# Patient Record
Sex: Male | Born: 1935 | Race: White | Hispanic: No | Smoking: Never smoker
Health system: Southern US, Community
[De-identification: ages and names within clinical notes are randomized; demographics above are authoritative.]

## PROBLEM LIST (undated history)

## (undated) DIAGNOSIS — R350 Frequency of micturition: Secondary | ICD-10-CM

## (undated) DIAGNOSIS — E785 Hyperlipidemia, unspecified: Secondary | ICD-10-CM

## (undated) DIAGNOSIS — C61 Malignant neoplasm of prostate: Secondary | ICD-10-CM

## (undated) DIAGNOSIS — I2510 Atherosclerotic heart disease of native coronary artery without angina pectoris: Secondary | ICD-10-CM

## (undated) HISTORY — DX: Malignant neoplasm of prostate: C61

## (undated) HISTORY — PX: CORONARY ANGIOPLASTY WITH STENT PLACEMENT: SHX49

## (undated) HISTORY — DX: Atherosclerotic heart disease of native coronary artery without angina pectoris: I25.10

## (undated) HISTORY — DX: Hyperlipidemia, unspecified: E78.5

---

## 1998-09-20 ENCOUNTER — Observation Stay (HOSPITAL_COMMUNITY): Admission: AD | Admit: 1998-09-20 | Discharge: 1998-09-21 | Payer: Self-pay | Admitting: Cardiovascular Disease

## 1999-05-02 ENCOUNTER — Observation Stay (HOSPITAL_COMMUNITY): Admission: AD | Admit: 1999-05-02 | Discharge: 1999-05-03 | Payer: Self-pay | Admitting: Cardiovascular Disease

## 1999-05-02 HISTORY — PX: CARDIAC CATHETERIZATION: SHX172

## 2003-08-31 ENCOUNTER — Encounter: Payer: Self-pay | Admitting: Emergency Medicine

## 2003-08-31 ENCOUNTER — Emergency Department (HOSPITAL_COMMUNITY): Admission: EM | Admit: 2003-08-31 | Discharge: 2003-08-31 | Payer: Self-pay | Admitting: Emergency Medicine

## 2005-07-07 HISTORY — PX: US ECHOCARDIOGRAPHY: HXRAD669

## 2006-04-21 ENCOUNTER — Ambulatory Visit: Payer: Self-pay | Admitting: Internal Medicine

## 2006-05-21 ENCOUNTER — Ambulatory Visit: Payer: Self-pay | Admitting: Internal Medicine

## 2006-06-26 ENCOUNTER — Encounter: Payer: Self-pay | Admitting: *Deleted

## 2006-06-26 ENCOUNTER — Inpatient Hospital Stay (HOSPITAL_COMMUNITY): Admission: AD | Admit: 2006-06-26 | Discharge: 2006-06-29 | Payer: Self-pay

## 2006-08-24 ENCOUNTER — Ambulatory Visit (HOSPITAL_COMMUNITY): Admission: RE | Admit: 2006-08-24 | Discharge: 2006-08-24 | Payer: Self-pay | Admitting: Internal Medicine

## 2007-01-14 HISTORY — PX: CARDIOVASCULAR STRESS TEST: SHX262

## 2007-01-24 ENCOUNTER — Inpatient Hospital Stay (HOSPITAL_BASED_OUTPATIENT_CLINIC_OR_DEPARTMENT_OTHER): Admission: RE | Admit: 2007-01-24 | Discharge: 2007-01-24 | Payer: Self-pay | Admitting: Cardiovascular Disease

## 2007-01-24 HISTORY — PX: CARDIAC CATHETERIZATION: SHX172

## 2008-08-02 ENCOUNTER — Inpatient Hospital Stay (HOSPITAL_COMMUNITY): Admission: EM | Admit: 2008-08-02 | Discharge: 2008-08-08 | Payer: Self-pay | Admitting: Emergency Medicine

## 2008-10-26 ENCOUNTER — Ambulatory Visit: Payer: Self-pay | Admitting: Vascular Surgery

## 2008-11-06 ENCOUNTER — Ambulatory Visit (HOSPITAL_COMMUNITY): Admission: RE | Admit: 2008-11-06 | Discharge: 2008-11-06 | Payer: Self-pay | Admitting: Internal Medicine

## 2009-02-17 ENCOUNTER — Emergency Department (HOSPITAL_BASED_OUTPATIENT_CLINIC_OR_DEPARTMENT_OTHER): Admission: EM | Admit: 2009-02-17 | Discharge: 2009-02-17 | Payer: Self-pay | Admitting: Emergency Medicine

## 2009-02-17 ENCOUNTER — Ambulatory Visit: Payer: Self-pay | Admitting: Diagnostic Radiology

## 2009-05-15 ENCOUNTER — Ambulatory Visit: Admission: RE | Admit: 2009-05-15 | Discharge: 2009-07-15 | Payer: Self-pay | Admitting: Radiation Oncology

## 2009-05-28 ENCOUNTER — Encounter (HOSPITAL_COMMUNITY): Admission: RE | Admit: 2009-05-28 | Discharge: 2009-08-22 | Payer: Self-pay | Admitting: Urology

## 2009-07-17 ENCOUNTER — Ambulatory Visit: Admission: RE | Admit: 2009-07-17 | Discharge: 2009-10-13 | Payer: Self-pay | Admitting: Radiation Oncology

## 2009-08-21 LAB — URINALYSIS, MICROSCOPIC - CHCC
Glucose: NEGATIVE g/dL
Leukocyte Esterase: NEGATIVE
Nitrite: NEGATIVE
Protein: <30 mg/dL
Specific Gravity, Urine: 1.03 (ref 1.003–1.035)

## 2009-12-01 ENCOUNTER — Emergency Department (HOSPITAL_BASED_OUTPATIENT_CLINIC_OR_DEPARTMENT_OTHER): Admission: EM | Admit: 2009-12-01 | Discharge: 2009-12-01 | Payer: Self-pay | Admitting: Emergency Medicine

## 2009-12-01 ENCOUNTER — Ambulatory Visit: Payer: Self-pay | Admitting: Diagnostic Radiology

## 2009-12-30 ENCOUNTER — Ambulatory Visit (HOSPITAL_COMMUNITY): Admission: RE | Admit: 2009-12-30 | Discharge: 2009-12-30 | Payer: Self-pay | Admitting: Internal Medicine

## 2010-03-08 ENCOUNTER — Emergency Department (HOSPITAL_BASED_OUTPATIENT_CLINIC_OR_DEPARTMENT_OTHER): Admission: EM | Admit: 2010-03-08 | Discharge: 2010-03-08 | Payer: Self-pay | Admitting: Emergency Medicine

## 2010-03-14 ENCOUNTER — Emergency Department (HOSPITAL_BASED_OUTPATIENT_CLINIC_OR_DEPARTMENT_OTHER): Admission: EM | Admit: 2010-03-14 | Discharge: 2010-03-14 | Payer: Self-pay | Admitting: Emergency Medicine

## 2010-07-03 ENCOUNTER — Ambulatory Visit: Payer: Self-pay | Admitting: Cardiovascular Disease

## 2010-07-30 ENCOUNTER — Encounter: Payer: Self-pay | Admitting: Internal Medicine

## 2010-07-31 ENCOUNTER — Encounter: Payer: Self-pay | Admitting: Internal Medicine

## 2010-08-01 ENCOUNTER — Telehealth: Payer: Self-pay | Admitting: Internal Medicine

## 2010-08-03 ENCOUNTER — Encounter: Payer: Self-pay | Admitting: Internal Medicine

## 2010-08-06 ENCOUNTER — Encounter (INDEPENDENT_AMBULATORY_CARE_PROVIDER_SITE_OTHER): Payer: Self-pay | Admitting: *Deleted

## 2010-08-06 ENCOUNTER — Ambulatory Visit: Payer: Self-pay | Admitting: Internal Medicine

## 2010-08-06 DIAGNOSIS — I259 Chronic ischemic heart disease, unspecified: Secondary | ICD-10-CM

## 2010-08-06 DIAGNOSIS — K520 Gastroenteritis and colitis due to radiation: Secondary | ICD-10-CM

## 2010-08-06 LAB — CONVERTED CEMR LAB: WBC: 7 10*3/uL

## 2010-08-14 ENCOUNTER — Encounter (INDEPENDENT_AMBULATORY_CARE_PROVIDER_SITE_OTHER): Payer: Self-pay | Admitting: *Deleted

## 2010-09-10 ENCOUNTER — Ambulatory Visit: Payer: Self-pay | Admitting: Internal Medicine

## 2010-09-10 ENCOUNTER — Ambulatory Visit (HOSPITAL_COMMUNITY): Admission: RE | Admit: 2010-09-10 | Discharge: 2010-09-10 | Payer: Self-pay | Admitting: Internal Medicine

## 2010-10-27 ENCOUNTER — Ambulatory Visit: Payer: Self-pay | Admitting: Internal Medicine

## 2010-12-23 NOTE — Letter (Signed)
Summary: Diabetic Instructions  [facility]  [address] [place], [place] [address]   Phone: [phone]  Fax: [phone]    [name] [birth year] MRN: [id number]   _X_   ORAL DIABETIC MEDICATION INSTRUCTIONS  The day before your procedure:   Take your diabetic pill as you do normally  The day of your procedure:   Do not take your diabetic pill    We will check your blood sugar levels during the admission process and again in Recovery before discharging you home  ________________________________________________________________________  _X_   INSULIN (LONG ACTING) MEDICATION INSTRUCTIONS (Lantus, NPH, 70/30, Humulin, Novolin-N)   The day before your procedure:   Take  your regular evening dose    The day of your procedure:   Do not take your morning dose    _X_   INSULIN (SHORT ACTING) MEDICATION INSTRUCTIONS (Regular, Humulog, Novolog)   The day before your procedure:   Do not take your evening dose   The day of your procedure:   Do not take your morning dose

## 2010-12-23 NOTE — Procedures (Signed)
Summary: Colonoscopy: Diverticulosis   Colonoscopy  Procedure date:  05/21/2006  Findings:      Results: Diverticulosis. Location:  [facility].  Comments: 1) NO POLYPS OR CANCER SEEN 2) SIGMOID DIVERTICULOSIS 3) EXCELLENT PREP 4) 12 MIN 22 SEC COLON WITHDRAWAL TIME  Patient Name: [name], [name] MRN:  Procedure Procedures: Colorectal cancer screening, average risk CPT: G0121.  Personnel: Endoscopist: Iva Boop, MD, [facility].  Referred By: Rodrigo Ran, MD.  Exam Location: Exam performed in Outpatient Clinic. Outpatient  Patient Consent: Procedure, Alternatives, Risks and Benefits discussed, consent obtained, from patient. Consent was obtained by the RN.  Indications  Average Risk Screening Routine.  History  Current Medications: Patient is not currently taking Coumadin.  Allergies: Allergic to SULFA.  Pre-Exam Physical: Performed May 21, 2006. Cardio-pulmonary exam WNL. Rectal exam abnormal. HEENT exam , Abdominal exam, Mental status exam WNL. Abnormal PE findings include: Prostate 3+ without nodules.  Comments: Pt. history reviewed/updated, physical exam performed prior to initiation of sedation? YES Exam Exam: Extent of exam reached: Cecum, extent intended: Cecum.  The cecum was identified by appendiceal orifice and IC valve. Patient position: on left side. Time for Withdrawl: 00:12:22. Colon retroflexion performed. Images taken. ASA Classification: II. Tolerance: excellent.  Monitoring: Pulse and BP monitoring, Oximetry used. Supplemental O2 given.  Colon Prep Used MiraLax for colon prep. Prep results: excellent.  Sedation Meds: Patient assessed and found to be appropriate for moderate (conscious) sedation. Fentanyl 50 mcg. given IV. Versed 5 mg. given IV.  Findings - DIVERTICULOSIS: Sigmoid Colon. ICD9: Diverticulosis, Colon: 562.10.  - NORMAL EXAM: Cecum to Descending Colon.  NORMAL EXAM: Rectum.   Assessment  Diagnoses: 562.10:  Diverticulosis, Colon.   Comments: 1) NO POLYPS OR CANCER SEEN 2) SIGMOID DIVERTICULOSIS 3) EXCELLENT PREP 4) 12 MIN 22 SEC COLON WITHDRAWAL TIME Events  Unplanned Interventions: No intervention was required.  Plans Patient Education: Patient given standard instructions for: Diverticulosis.  Disposition: After procedure patient sent to recovery. After recovery patient sent home.  Scheduling/Referral: Primary Care Provider, to Rodrigo Ran, MD, AS PLANNED,   Comments: He will "age out" of routine colonscopy screening at 45. Will defer to Dr. Waynard Edwards re: future screening. Investigate colonic signs and symptoms as appropriate in future.  CC:   Rodrigo Ran, MD  This report was created from the original endoscopy report, which was reviewed and signed by the above listed endoscopist.

## 2010-12-23 NOTE — Assessment & Plan Note (Signed)
Summary: RECTAL BLEEDING/DIARRHEA             [name]   History of Present Illness Visit Type: Initial Consult Primary GI MD: Stan Head MD [facility] Primary Provider: Rodrigo Ran, MD Requesting Provider: Rodrigo Ran, MD Chief Complaint: BRB stool 7 days ago, gassy diarrhea History of Present Illness:   77 yo wm with 2 month hx of very small amount of rectal bleeding. He had taken some suppositories. Then recently had a gassy stool with more red blood. Since then, about 1 week ago he has been ok, no blood on toilet paper.  Here with daughter and she provides some history.    GI Review of Systems    Reports bloating.      Denies abdominal pain, acid reflux, belching, chest pain, dysphagia with liquids, dysphagia with solids, heartburn, loss of appetite, nausea, vomiting, vomiting blood, weight loss, and  weight gain.      Reports diarrhea and  rectal bleeding.     Denies anal fissure, black tarry stools, change in bowel habit, constipation, diverticulosis, fecal incontinence, heme positive stool, hemorrhoids, irritable bowel syndrome, jaundice, light color stool, liver problems, and  rectal pain.    Current Medications (verified): 1)  Colace 50 Mg Caps (Docusate Sodium) .... Take 1 Capsule By Mouth Once A Day As Needed 2)  Crestor 20 Mg Tabs (Rosuvastatin Calcium) .... Take 1 Tablet By Mouth Once A Day 3)  Glucophage 1000 Mg Tabs (Metformin Hcl) .... Take 1 Tablet By Mouth Two Times A Day 4)  Imdur 30 Mg Xr24h-Tab (Isosorbide Mononitrate) .... Take 1 Tablet By Mouth Once A Day 5)  Lantus 100 Unit/ml Soln (Insulin Glargine) .... Inject 19 Untis in The Morning and 19 Untis in The Evening 6)  Lexapro 10 Mg Tabs (Escitalopram Oxalate) .... Take 1/2 Tab By Mouth Once Daily 7)  Novolog Flexpen 100 Unit/ml Soln (Insulin Aspart) .... Inject 8 Units With Each Meal 8)  Plavix 75 Mg Tabs (Clopidogrel Bisulfate) .... Take 1 Tablet By Mouth Once A Day 9)  Reclast 5 Mg/12ml Soln (Zoledronic Acid)  .... One Injection Yearly 10)  Aspirin 81 Mg Tabs (Aspirin) .... Take 1 Tablet By Mouth Once A Day 11)  Vitamin B-12 100 Mcg Tabs (Cyanocobalamin) .... Take 1 Tablet By Mouth Once A Day 12)  Cinnamon 500 Mg Tabs (Cinnamon) .... Take 1 Tablet By Mouth Once A Day 13)  Citracal/vitamin D 250-200 Mg-Unit Tabs (Calcium Citrate-Vitamin D) .... Take 1 Tablet By Mouth Two Times A Day 14)  Fish Oil 1200 Mg Caps (Omega-3 Fatty Acids) .... Take 1 Capsule By Mouth Once A Day 15)  Vitamin D 1000 Unit Tabs (Cholecalciferol) .... .qddtab 16)  Nystop 100000 Unit/gm Powd (Nystatin) .... Apply To Affected Area Two Times A Day As Needed 17)  Anusol-Hc 25 Mg Supp (Hydrocortisone Acetate) .... Unwrap and Insert 1 Suppository  Two Times A Day As Needed For Anal Irritation 18)  Glucosamine-Chondroitin  Caps (Glucosamine-Chondroit-Vit C-Mn) .... Take 2 Capsule Daily 19)  Centrum Silver  Tabs (Multiple Vitamins-Minerals) .... Once Daily 20)  Actos 30 Mg Tabs (Pioglitazone Hcl) .... Once Daily 21)  Bicalutamide 50 Mg Tabs (Bicalutamide) .... Once Daily  Allergies (verified): 1)  ! Sulfa  Past History:  Past Medical History: Coronary Artery Disease Diabetes Diverticulosis Myocardial Infarction Anal Fissure Prostate Cancer XRT late 2010  Past Surgical History: Reviewed history from 08/06/2010 and no changes required. PTCA-Stent  Family History: Reviewed history from 08/06/2010 and no changes required. Family History  of Diabetes: Father Family History of Heart Disease: Father  Social History: Reviewed history from 08/06/2010 and no changes required. 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  Alcohol Use - yes Daily Caffeine Use Illicit Drug Use - no Patient gets regular exercise.  Review of Systems       The patient complains of muscle pains/cramps.         All other ROS negative except as per HPI.   Vital Signs:  Patient profile:   77 year old male Height:      74  inches Weight:      206.50 pounds BMI:     26.61  Vitals Entered By: June McMurray CMA Duncan Dull) (August 06, 2010 3:17 PM)  Physical Exam  General:  elderly NAD Eyes:  anicteric Neck:  supple Lungs:  Clear throughout to auscultation. Heart:  Regular rate and rhythm; no murmurs, rubs,  or bruits. Rectal:  soft brown stool no mass mild perianal scaly erythema ANOSCOPY: hemorrhoids and suspected telangiectasia of anorectum  Extremities:  no edema Psych:  Alert and cooperative. Normal mood and affect.  Office notes and labs from Dr. Waynard Edwards reviewed CBC 07/31/10 was normal  Impression & Recommendations:  Problem # 1:  RECTAL BLEEDING (ICD-569.3) I suspect radiation proctitis Have recommended  flex sig with likely APC Risks, benefits,and indications of endoscopic procedure(s) were reviewed with the patient and all questions answered. He will need to hold Plavix but continue ASA, which will increase usual risks of the procedure to risk of stent closure  Orders: ZFLEX  APC (ZFL APC)  Problem # 2:  LOOSE STOOLS (ICD-787.91) Assessment: New Probably from XRT also but could be a more transient (non-infectious) issue. Await flex sig.  Problem # 3:  CORONARY ARTERY DISEASE, S/P PTCA (ICD-414.9) Assessment: New On Plavix, needs to ne held to allow APC I think. Will ask for opinion from Dr. Elease Hashimoto. Removing Plavix does raise risk of vascular event but benefits outweigh risk in my opinion.  Patient Instructions: 1)  Please pick up your medications at your pharmacy.  2)  We will see you at your procedure on 09/10/10. 3)  Colonoscopy and Flexible Sigmoidoscopy brochure given.  4)  We will contact Dr. Elease Hashimoto regarding your plavix.  You will be contacted by our office prior to your procedure for directions on holding your Plavix.  If you do not hear from our office 1 week prior to your scheduled procedure, please call [phone] to discuss.  5)  Copy sent to : Rodrigo Ran, MD; Kristeen Miss, MD 6)  The medication list was reviewed and reconciled.  All changed / newly prescribed medications were explained.  A complete medication list was provided to the patient / caregiver. Prescriptions: REGLAN 10 MG  TABS (METOCLOPRAMIDE HCL) As per prep instructions.  #2 x 0   Entered by:   Francee Piccolo CMA (AAMA)   Authorized by:   Iva Boop MD, [facility]   Signed by:   Francee Piccolo CMA (AAMA) on 08/06/2010   Method used:   Electronically to        [facility] [phone]* (retail)       [address]       [place], [place]  [address]       Ph: [phone]       Fax: [phone]   RxID:   [id number] DULCOLAX 5 MG  TBEC (BISACODYL) Day  before procedure take 2 at 3pm and 2 at 8pm.  #4 x 0   Entered by:   Francee Piccolo CMA (AAMA)   Authorized by:   Iva Boop MD, [name]   Signed by:   Francee Piccolo CMA (AAMA) on 08/06/2010   Method used:   Electronically to        [facility] [phone]* (retail)       [address]       [place], [place]  [address]       Ph: [phone]       Fax: [phone]   RxID:   [id number] MIRALAX   POWD (POLYETHYLENE GLYCOL 3350) As per prep  instructions.  #255gm x 0   Entered by:   Francee Piccolo CMA (AAMA)   Authorized by:   Iva Boop MD, [facility]   Signed by:   Francee Piccolo CMA (AAMA) on 08/06/2010   Method used:   Electronically to        [facility] [phone]* (retail)       [address]       [place], [place]  [address]       Ph: [phone]       Fax: [phone]   RxID:   [id number]

## 2010-12-23 NOTE — Letter (Signed)
Summary: [facility]  [facility]   Imported By: Sherian Rein 08/12/2010 09:13:20  _____________________________________________________________________  External Attachment:    Type:   Image     Comment:   External Document

## 2010-12-23 NOTE — Letter (Signed)
Summary: [facility]  [facility]   Imported By: Sherian Rein 08/12/2010 09:15:50  _____________________________________________________________________  External Attachment:    Type:   Image     Comment:   External Document

## 2010-12-23 NOTE — Procedures (Signed)
Summary: Flexible Sigmoidoscopy  Patient: [name] Note: All result statuses are Final unless otherwise noted.  Tests: (1) Flexible Sigmoidoscopy (FLX)  FLX Flexible Sigmoidoscopy                             DONE     [facility]     [address] [place], [place]  [address]           FLEXIBLE SIGMOIDOSCOPY PROCEDURE REPORT           PATIENT:  [name], [name]  MR#:  [id number]     BIRTHDATE:  1927-04-02, 83 yrs. old  GENDER:  male           ENDOSCOPIST:  Iva Boop, MD, [facility]           PROCEDURE DATE:  09/10/2010     PROCEDURE:  Flexible sigmoidoscopy with APC ablation of lesion     ASA CLASS:  Class III     INDICATIONS:  rectal bleeding some loose stools also     had XRT for prostate cancer in 2010           MEDICATIONS:   Fentanyl 25 mcg IV, Versed 3 mg           DESCRIPTION OF PROCEDURE:   After the risks benefits and     alternatives of the procedure were thoroughly explained, informed     consent was obtained.  Digital rectal exam was performed and     revealed no abnormalities.   The  endoscope was introduced through     the anus and advanced to the sigmoid colon, without limitations.     The quality of the prep was excellent.  The instrument was then     slowly withdrawn as the mucosa was fully examined.     <<PROCEDUREIMAGES>>           Radiation proctitis was seen in  in the rectum, extensive     telangiectasia from anal verge and a few cm in, only on one side     (anterior).  This was ablated using the argon plasma coagulator     with setting of 60 and 1L flow. Good results though slight amount     of residual abnormality not treated due to edema and effects of     APC (expected). Normal sigmoid.   Retroflexed views in the rectum     revealed proctitis.    The scope was then withdrawn from the     patient and the procedure terminated.           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     3) Call Dr. Marvell Fuller office by next week to arrange a follow-up     visit for about 6 weeks from now (late Nov or early Dec). repeat     ablation may be needed depending upon effects of today's     treatment/           REPEAT EXAM:  In for as needed.  Iva Boop, MD, Clementeen Graham           CC:  Rodrigo Ran, MD, Kristeen Miss, MD, Chipper Herb MD, Barron Alvine, MD, and The Patient           n.     eSIGNED:   Iva Boop at 09/10/2010 10:08 AM           Posey Rea, [phone]  Note: An exclamation mark (!) indicates a result that was not dispersed into the flowsheet. Document Creation Date: 09/10/2010 10:08 AM _______________________________________________________________________  (1) Order result status: Final Collection or observation date-time: 09/10/2010 09:57 Requested date-time:  Receipt date-time:  Reported date-time:  Referring Physician:   Ordering Physician: Stan Head [phone]) Specimen Source:  Source: Launa Grill Order Number: [id number] Lab site:

## 2010-12-23 NOTE — Letter (Signed)
Summary: [facility] Instructions  [facility]  [address] [place], [place] [address]   Phone: [phone]  Fax: [phone]       [name]    30-Oct-1927    MRN: [id number]       Procedure Day Dorna BloomLulu Riding, 09/10/10     Arrival Time: 8:00 AM     Procedure Time: 9:00 AM    Location of Procedure:                    _X_  [facility] ( Outpatient Registration)      PREPARATION FOR FLEXIBLE SIGMOIDOSCOPY WITH MIRALAX  Starting 5 days prior to your procedure 09/05/10 do not eat nuts, seeds, popcorn, corn, beans, peas,  salads, or any raw vegetables.  Do not take any fiber supplements (e.g. Metamucil, Citrucel, and Benefiber). ____________________________________________________________________________________________________   THE DAY BEFORE YOUR PROCEDURE         TUESDAY, 09/09/10  1   Drink clear liquids the entire day-NO SOLID FOOD  2   Do not drink anything colored red or purple.  Avoid juices with pulp.  No orange juice.  3   Drink at least 64 oz. (8 glasses) of fluid/clear liquids during the day to prevent dehydration and help the prep work efficiently.  CLEAR LIQUIDS INCLUDE: Water Jello Ice Popsicles Tea (sugar ok, no milk/cream) Powdered fruit flavored drinks Coffee (sugar ok, no milk/cream) Gatorade Juice: apple, white grape, white cranberry  Lemonade Clear bullion, consomm, broth Carbonated beverages (any kind) Strained chicken noodle soup Hard Candy  4   Mix the entire bottle of Miralax with 64 oz. of Gatorade/Powerade in the morning and put in the refrigerator to chill.  5   At 3:00 pm take 2 Dulcolax/Bisacodyl tablets.  6   At 4:30 pm take one Reglan/Metoclopramide tablet.  7  Starting at 5:00 pm drink one 8 oz glass of the Miralax mixture every 15-20 minutes until you have finished drinking the entire 64 oz.  You should finish drinking prep around 7:30 or 8:00 pm.  8   If you are nauseated, you may take the 2nd  Reglan/Metoclopramide tablet at 6:30 pm.        9    At 8:00 pm take 2 more DULCOLAX/Bisacodyl tablets.       THE DAY OF YOUR PROCEDURE      WEDNESDAY, 09/10/10  You may drink clear liquids until 5:00 AM  (4 HOURS BEFORE PROCEDURE).   MEDICATION INSTRUCTIONS  Unless otherwise instructed, you should take regular prescription medications with a small sip of water as early as possible the morning of your procedure.  Stop taking Plavix or Aggrenox on  _  _  (7 days before procedure). You will be contacted by our office prior to your procedure for directions on holding your Plavix.  If you do not hear from our office 1 week prior to your scheduled procedure, please call [phone] to discuss.           Additional medication instructions: Continue Aspirin         OTHER INSTRUCTIONS  You will need a responsible adult at least 75 years of age to accompany you and drive you home.   This person must remain in the waiting room during your procedure.  Wear loose fitting clothing that is easily removed.  Leave jewelry and other valuables at home.  However, you may wish to bring a book to read or an iPod/MP3 player to listen  to music as you wait for your procedure to start.  Remove all body piercing jewelry and leave at home.  Total time from sign-in until discharge is approximately 2-3 hours.  You should go home directly after your procedure and rest.  You can resume normal activities the day after your procedure.  The day of your procedure you should not:   Drive   Make legal decisions   Operate machinery   Drink alcohol   Return to work  You will receive specific instructions about eating, activities and medications before you leave.   The above instructions have been reviewed and explained to me by   _______________________    I fully understand and can verbalize these instructions _____________________________ Date _______

## 2010-12-23 NOTE — Assessment & Plan Note (Signed)
Summary: f/u from procedure--ch.   History of Present Illness Visit Type: Follow-up Visit Primary GI MD: Stan Head MD [facility] Primary Provider: Rodrigo Ran, MD Requesting Provider: na Chief Complaint: Follow up after flex History of Present Illness:   Patient states that he sometimes sees a "hint" of blood in his stools. He states that he only sees the blood when he has explosive stools and these occur about every third day. He has no incontinence and believes that he is improved after the APC treatment of radiation proctitis.    GI Review of Systems      Denies abdominal pain, acid reflux, belching, bloating, chest pain, dysphagia with liquids, dysphagia with solids, heartburn, loss of appetite, nausea, vomiting, vomiting blood, weight loss, and  weight gain.      Reports rectal bleeding.     Denies anal fissure, black tarry stools, change in bowel habit, constipation, diarrhea, diverticulosis, fecal incontinence, heme positive stool, hemorrhoids, irritable bowel syndrome, jaundice, light color stool, liver problems, and  rectal pain. 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 Take 1 Tablet By Mouth Once A Day 2)  Glucophage 1000 Mg Tabs (Metformin Hcl) .... Take 1 Tablet By Mouth Two Times A Day 3)  Imdur 30 Mg Xr24h-Tab (Isosorbide Mononitrate) .... Take 1 Tablet By Mouth Once A Day 4)  Lantus 100 Unit/ml Soln (Insulin Glargine) .... Inject 19 Untis in The Morning and 19 Untis in The Evening 5)  Lexapro 10 Mg Tabs (Escitalopram Oxalate) .... Take 1/2 Tab By Mouth Once Daily 6)  Novolog Flexpen 100 Unit/ml Soln (Insulin Aspart) .... Inject 8 Units With Each Meal 7)  Plavix 75 Mg Tabs (Clopidogrel Bisulfate) .... Take 1 Tablet By Mouth Once A Day 8)  Reclast 5  Mg/154ml Soln (Zoledronic Acid) .... One Injection Yearly 9)  Aspirin 81 Mg Tabs (Aspirin) .... Take 1 Tablet By Mouth Once A Day 10)  Vitamin B-12 100 Mcg Tabs (Cyanocobalamin) .... Take 1 Tablet By Mouth Once A Day 11)  Cinnamon 500 Mg Tabs (Cinnamon) .... Take 1 Tablet By Mouth Once A Day 12)  Citracal/vitamin D 250-200 Mg-Unit Tabs (Calcium Citrate-Vitamin D) .... Take 1 Tablet By Mouth Two Times A Day 13)  Fish Oil 1200 Mg Caps (Omega-3 Fatty Acids) .... Take 1 Capsule By Mouth Once A Day 14)  Vitamin D 1000 Unit Tabs (Cholecalciferol) .... .qddtab 15)  Nystop 100000 Unit/gm Powd (Nystatin) .... Apply To Affected Area Two Times A Day As Needed 16)  Glucosamine-Chondroitin  Caps (Glucosamine-Chondroit-Vit C-Mn) .... Take 2 Capsule Daily 17)  Centrum Silver  Tabs (Multiple Vitamins-Minerals) .... Once Daily 18)  Actos 30 Mg Tabs (Pioglitazone Hcl) .... Once Daily 19)  Bicalutamide 50 Mg Tabs (Bicalutamide) .... Once Daily  Allergies (verified): 1)  ! Sulfa  Past History:  Past Medical History: Reviewed history from 08/06/2010 and no changes required. Coronary Artery Disease Diabetes Diverticulosis Myocardial Infarction Anal Fissure Prostate Cancer XRT late 2010  Past Surgical History: Reviewed history from 08/06/2010 and no changes required. 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  Alcohol Use - yes social limit of one Daily Caffeine Use coffee one cup in the morning Illicit Drug Use - no Patient gets regular exercise.  Vital Signs:  Patient profile:   77 year old male Height:      74 inches Weight:      208.2 pounds BMI:     26.83 Pulse rate:   72 / minute Pulse rhythm:   regular BP sitting:   100 / 52  (left arm) Cuff size:   regular  Vitals Entered By: Harlow Mares CMA Duncan Dull) (October 27, 2010 11:53 AM)   Impression & Recommendations:  Problem # 1:  RADIATION PROCTITIS (ICD-558.1) Assessment Improved Observe If symptoms increase we can reconsider treating with APC again.  Patient Instructions: 1)  Please continue current medications.  2)  Please schedule a follow-up appointment as needed.  3)  Copy sent to : Rodrigo Ran, MD 4)  The medication list was reviewed and reconciled.  All changed / newly prescribed medications were explained.  A complete medication list was provided to the patient / caregiver.

## 2010-12-23 NOTE — Procedures (Signed)
Summary: Instructions for procedure/[place]  Instructions for procedure/[place]   Imported By: Sherian Rein 08/11/2010 07:53:02  _____________________________________________________________________  External Attachment:    Type:   Image     Comment:   External Document

## 2010-12-23 NOTE — Progress Notes (Signed)
Summary: Triage-Sooner Appt. Request  Phone Note From Other Clinic   Caller: Malachi Bonds @ [facility] [phone] Call For: Dr. Leone Payor Summary of Call: Rectal bleeding, checking C-diff...requesting pt be seen in next 2 wks. Initial call taken by: Karna Christmas,  August 01, 2010 11:43 AM  Follow-up for Phone Call        Pt. will see Dr.Gessner on 08-06-10 at 2:15pm. Malachi Bonds will advise pt. of appt/med.list/co-pay/cx.policy. She will fax records to [place].  Follow-up by: Laureen Ochs LPN,  August 01, 2010 12:17 PM

## 2010-12-23 NOTE — Letter (Signed)
Summary: Anticoagulation Modification Letter  [facility]  [address] [place], [place] [address]   Phone: [phone]  Fax: [phone]    August 06, 2010  Re:    MELQUIADES, KOVAR DOB:    Apr 20, 1927 MRN:  [id number]    Dear Dr. Elease Hashimoto:  We have scheduled the above patient for an endoscopic procedure with Dr. Leone Payor. Our records show that he is on anticoagulation therapy. Please advise as to how long the patient may come off their therapy of Plavix prior to the scheduled procedure(s) on September 10, 2010.  Please fax the completed form to Francee Piccolo, CMA (AAMA) at [phone].  Thank you for your help with this matter.  Sincerely,  Francee Piccolo CMA [facility])   Physician Recommendation:  Hold Plavix 7 days prior ________________  Other ______________________________     Appended Document: Anticoagulation Modification Letter pt notified to begin holding plavix on 08/05/10.  Pt states he is under the weather and his memory is not at it's best right now so I will also mail the patient a letter with this information.  Hardcopy to be filed in [employer] chart.

## 2010-12-23 NOTE — Letter (Signed)
Summary: Plavix Instructions  [facility]  [address] [place], [place] [address]   Phone: [phone]  Fax: [phone]           08/14/2010  [name] [address] [place], [place]  [address]  Dear Mr. Slabaugh,  As we spoke about earlier today Dr. Elease Hashimoto says it is OK to hold your Plavix for 7 days prior to your procedure.  The last day you should take Plavix is September 03, 2010.  Dr. Leone Payor will give you instructions on restarting this medication the day of your procedure.    Please call me with any questions.  Sincerely,   Francee Piccolo CMA (AAMA)

## 2010-12-23 NOTE — Letter (Signed)
Summary: Anticoagulation/[facility]  Anticoagulation/[facility]   Imported By: Sherian Rein 08/18/2010 10:12:52  _____________________________________________________________________  External Attachment:    Type:   Image     Comment:   External Document

## 2010-12-23 NOTE — Letter (Signed)
Summary: [facility]   Imported By: Sherian Rein 08/12/2010 09:12:25  _____________________________________________________________________  External Attachment:    Type:   Image     Comment:   External Document

## 2011-01-02 ENCOUNTER — Ambulatory Visit (HOSPITAL_COMMUNITY)
Admission: RE | Admit: 2011-01-02 | Discharge: 2011-01-02 | Disposition: A | Discharge status: Home Health | Payer: Medicare Other | Source: Ambulatory Visit | Attending: Internal Medicine | Admitting: Internal Medicine

## 2011-01-02 DIAGNOSIS — M810 Age-related osteoporosis without current pathological fracture: Secondary | ICD-10-CM | POA: Insufficient documentation

## 2011-01-06 ENCOUNTER — Ambulatory Visit (INDEPENDENT_AMBULATORY_CARE_PROVIDER_SITE_OTHER): Payer: Medicare Other | Admitting: Cardiovascular Disease

## 2011-01-06 DIAGNOSIS — Z9861 Coronary angioplasty status: Secondary | ICD-10-CM

## 2011-01-06 DIAGNOSIS — E7800 Pure hypercholesterolemia, unspecified: Secondary | ICD-10-CM

## 2011-01-06 DIAGNOSIS — I2510 Atherosclerotic heart disease of native coronary artery without angina pectoris: Secondary | ICD-10-CM

## 2011-02-04 LAB — GLUCOSE, CAPILLARY: Glucose-Capillary: 233 mg/dL — ABNORMAL HIGH (ref 70–99)

## 2011-03-20 ENCOUNTER — Other Ambulatory Visit: Payer: Self-pay | Admitting: Cardiovascular Disease

## 2011-03-20 DIAGNOSIS — I2510 Atherosclerotic heart disease of native coronary artery without angina pectoris: Secondary | ICD-10-CM

## 2011-03-23 ENCOUNTER — Other Ambulatory Visit: Payer: Self-pay | Admitting: *Deleted

## 2011-03-23 DIAGNOSIS — I2510 Atherosclerotic heart disease of native coronary artery without angina pectoris: Secondary | ICD-10-CM

## 2011-03-23 MED ORDER — CLOPIDOGREL BISULFATE 75 MG PO TABS: 75.0000 mg | ORAL_TABLET | Freq: Every day | ORAL | Status: AC

## 2011-03-23 MED ORDER — CLOPIDOGREL BISULFATE 75 MG PO TABS: 75.0000 mg | ORAL_TABLET | Freq: Every day | ORAL | Status: DC

## 2011-03-23 NOTE — Telephone Encounter (Signed)
Fax received from pharmacy. Refill completed. Jodette Tarsha Blando RN  
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
## 2011-04-07 NOTE — Consult Note (Signed)
[name], HASHEMI                  ACCOUNT NO.:  [id number]   MEDICAL RECORD NO.:  [id number]          PATIENT TYPE:  INP   LOCATION:  1441                         FACILITY:  [facility]   PHYSICIAN:  Doralee Albino. Carola Frost, M.D. DATE OF BIRTH:  12-Dec-1926   DATE OF CONSULTATION:  DATE OF DISCHARGE:                                 CONSULTATION   REASON FOR CONSULTATION:  Left comminuted proximal humerus and shaft  fractures.   BRIEF HISTORY OF PRESENTATION:  Mr. Pompei is a very pleasant 77 year old  left-hand dominant male who sustained a ground-level fall resulting  nonetheless in a severely comminuted displaced fracture.  The patient  denied any numbness or tingling in his arm, and denied other injury.  Currently denies paresthesias.   PAST MEDICAL HISTORY:  Notable for CAD status post cardiac stents, mild  MVR and tricuspid regurgitation as well.  Insulin dependent diabetes,  hypertension, cataracts dyslipidemia, BPH, retinal detachment.   ALLERGIES:  SULFA.   MEDICATIONS:  Glucophage, aspirin, multivitamin, Actos, Imdur, Zocor,  Lantus, Lexapro, metoprolol, fish oil, NovoLog, Plavix, vitamin D.   SOCIAL HISTORY:  The patient is a widow, lives in independent living  area, and is active at a low-level there.   REVIEW OF SYSTEMS:  Notable for the above.   FAMILY MEDICAL HISTORY:  Noncontributory.   PHYSICAL EXAMINATION:  CONSTITUTIONAL:  Patient is very pleasant no  acute distress.  VITAL SIGNS:  Afebrile.  Vital signs stable.  EXTREMITIES:  Left upper extremity intact radial, median and ulnar,  sensory and motor function.  He is in a Coapt splint and sling.  Excellent capillary refill and 2+ radial pulse. Wrist nontender,  appropriate digital strength.  No audible wheezing; normocephalic, atraumatic head, A&O x 4.   X-RAYS:  Plain x-rays as well as intended as CT scan recons were  reviewed.  We reviewed these films in concert with Dr. Otelia Sergeant.  They  reveal an impacted three-part  proximal humerus fracture as well as a  spiral proximal humeral shaft fracture which extends all way down into  the mid shaft area.   ASSESSMENT:  1. Three-part impacted proximal humerus fracture, second humeral shaft      fracture with some extension into the mid shaft area.  2. Multiple medical problems but stable and cleared by cardiology.   PLAN:  Dr. Otelia Sergeant and I spent over an hour in direct communication with  the patient regarding this complicated fracture. I have recommended  nonsurgical management of this fracture as there is the potential for  further displacement with attempted instrumentation.  Concern is that  the blood supply could be disrupted, particularly to the head, and that  the patient could develop some displacement of the tuberosity fragments  as well, which again could lead either to AVN or impingement and  nonunion of the tuberosity and poor overall function.  Also, possible is  complete loss of fixation which could result in the need to convert to a  hemiarthroplasty.  A hemiarthroplasty performed with an associated shaft  fracture would be very complicated as well, and increase the risk  of  complications including poor fixation, nonunion of the shaft, fragment  if that is attempted to be fixed first, blood loss, nerve injury and  others.  With nonsurgical management, the patient could go on to unite  both or either the proximal head and neck segment or the distal shaft  segment.  In either case healing one of them would dramatically improve  surgical management, if necessary, to treat the remaining problem.  Should the patient develop nonunion of both segments, which would be  highly unlikely, then, other than time, no bridges have been burned  regarding surgical management.  He may even be a candidate for a custom  prosthesis that could bypass the mid shaft fracture, though again I  believe this is extremely unlikely in a patient where it should unite  with  an acceptable functional outcome.  I will be happy to assist in any  way that Dr. Otelia Sergeant would direct.  If he requests otherwise, will be  available in the event that he does develop some complication and the  possibility that this may need surgical treatment.  I sincerely  appreciate the opportunity to see this very kind patient and his family.      Doralee Albino. Carola Frost, M.D.  Electronically Signed     MHH/MEDQ  D:  08/02/2008  T:  08/02/2008  Job:  [id number]
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
## 2011-04-07 NOTE — Discharge Summary (Signed)
[name], [name]                  ACCOUNT NO.:  [id number]   MEDICAL RECORD NO.:  [id number]          PATIENT TYPE:  INP   LOCATION:  1441                         FACILITY:  [facility]   PHYSICIAN:  Kerrin Champagne, M.D.   DATE OF BIRTH:  04-24-1927   DATE OF ADMISSION:  08/01/2008  DATE OF DISCHARGE:                               DISCHARGE SUMMARY   ADDENDUM:   DISCHARGE DIAGNOSIS:  Orthostatic hypotension.   DISCHARGE MEDICATIONS:  1. The patient's Actos was discontinued on August 06, 2008.  2. The patient's Lantus was increased to 20 units subcutaneous q.a.m.      and 28 units subcutaneous q.p.m.  This done on August 06, 2008.  3. The patient's NovoLog was changed to 7 units t.i.d. a.c. with      meals.  4. The patient's Altace was discontinued.  5. Therefore, this patient's discharge medications Altace has been      discontinued, as has Actos.  6. Lantus and NovoLog have been adjusted.   HOSPITAL COURSE:  On September 14, the patient was scheduled to be  transferred to [facility].  When seen with physical therapy attempts  at standing to transfer to chair the patient had a syncopal episode and  orthostatic blood pressures demonstrated significant decrease in blood  pressure when arising from lying down to sitting and then sitting to  standing position from a systolic blood pressure of 113 down to 87  systolic.  The patient's medications were adjusted and an IV was  replaced and he was given 250 mL bolus of fluid by cardiology.  The  patient's EKGs showed sinus rhythm with frequent PACs, but no  significant change from admission EKG.  His hemoglobin and hematocrit  showed a hemoglobin of 10-10.3 which was stable with hematocrit of 30%.  This was a hematocrit that did not warrant a transfusion.  The patient's  CBGs were running in the 200s so that the patient's Lantus was adjusted  and the patient had his Actos discontinued, as well as Altace  discontinued primarily as  these could be the source of persisting  hypotension with changes in position.  On August 07, 2008, Vesta Mixer, M.D. saw [name] and felt as though he was stable from a  cardiac standpoint, may be discharged to home.  He remained stable  during the 48 hours following the syncopal episode.  The patient was  also seen by Loraine Leriche A. Perini, M.D. of internal medicine who adjusted his  medications and discontinued the PCA and IVs.  He was felt to be stable  for discharge on August 08, 2008.  A follow-up x-ray of this  patient's humerus is to be obtained the morning of his discharge and  this will be reviewed prior to his being discharged today from [facility].   FINAL DISCHARGE DIAGNOSES:  1. Left humerus segmental comminuted shaft fracture with left humeral      head and neck three-part fracture mildly displaced.  2. Acute blood loss anemia.  3. Orthostatic hypotension.  4. Diabetes mellitus, insulin-dependent.  Note that the patient will      remain on metformin 1000 mg p.o. b.i.d. in addition to insulin as      noted.   STATUS AT THE TIME OF DISCHARGE:  Stable and improving.   The patient also will require a condom catheter as he has difficulty  standing, rising and moving to the bathroom.  This condom catheter may  be discontinued when the patient feels as though he is able to negotiate  to and from bathroom or bedside commode.  I also have recommended the  patient remain with PAS sequential compression device for both lower  extremities to the level of the knee.  This should be worn while in bed  to help decrease risks of deep venous thrombosis.  TED hose to the level  on the knee may also be used.  These should be removed two and three  times a day for inspection of skin to ensure no signs of skin pressure  ulcers.   The patient is to be seen by Vesta Mixer, M.D. in several weeks  following his discharge.  Mark A. Perini, M.D. will see this  patient  back also for follow-up in regards to his medications.  He is to be seen  by Kerrin Champagne, M.D. at [facility] on August 15, 2008.  The exact time to be called to the patient prior to his discharge.      Kerrin Champagne, M.D.  Electronically Signed     JEN/MEDQ  D:  08/08/2008  T:  08/08/2008  Job:  [id number]   cc:   Vesta Mixer, M.D.  Fax: [phone]
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
## 2011-04-10 NOTE — Cardiovascular Report (Signed)
NAMEJOSS, MCDILL NO.:  [id number]   MEDICAL RECORD NO.:  [id number]          PATIENT TYPE:  OIB   LOCATION:  1965                         FACILITY:  [facility]   PHYSICIAN:  Vesta Mixer, M.D. DATE OF BIRTH:  08-Feb-1927   DATE OF PROCEDURE:  01/24/2007  DATE OF DISCHARGE:                            CARDIAC CATHETERIZATION   [name] is a 77 year old gentleman with a history of coronary artery  disease.  He is status post PTCA and stenting of his left anterior  descending artery approximately 8 years ago.  He returns now for heart  catheterization after having an abnormal stress test.   The right femoral artery was easily cannulated using the modified  Seldinger technique.   HEMODYNAMICS:  LV pressure is 120/10 with an aortic pressure of 118/62.   ANGIOGRAPHY:  Left main:  The left main has minor luminal  irregularities.  There is mild to moderate calcification in the distal  aspect of the left main.   The left anterior descending artery is moderately calcified.  The  proximal stent is patent but there is a minor amount of in-stent  restenosis.  Overall, this represents approximately 20-25% restenosis  and is very acceptable after 8 years.   The first diagonal artery has mild irregularities but is otherwise  fairly normal.  It is a fairly large vessel.  The remainder of the left  anterior descending artery has only minor luminal irregularities.   The LAD reaches around the apex and supplies the inferoapical wall.   The left circumflex artery is a moderate-sized vessel.  It is fairly  tortuous and there is a 90-degree takeoff from the left main.  There is  an 80-90% stenosis prior to the bifurcation of the first obtuse  marginal.  This lesion is quite long and is diffusely narrowed.  The  distal obtuse marginal is approximately 2.0-2.25 mm in diameter.  There  is no true distal circumflex artery in the AV groove.   The right coronary artery is large  and dominant.  There are moderate  irregularities throughout the course of the RCA between 20 and 25%.  The  posterior descending artery is unremarkable.  There is a moderate to  large posterolateral branch, which is unremarkable.   The left ventriculogram was performed in a 30 RAO position.  There was  considerable ventricular ectopy with the contrast injection.  The LV  function appears to be mildly depressed.  Ejection fraction is between  45and 50%.  Of note is that the ejection fraction was calculated to be  41% by a stress nuclear study.   COMPLICATIONS:  None.   CONCLUSIONS:  1. Coronary artery disease involving the left anterior descending      artery and now the circumflex artery.  He has a      good long-term patency of the LAD stent.  He has no worsening of      his left circumflex stenosis.  We will discuss these issues with      the patient.  The vessel is somewhat on the small side  but would      discuss the possibility of a long 2.2 or 2.5 mm stent.           ______________________________  Vesta Mixer, M.D.     PJN/MEDQ  D:  01/24/2007  T:  01/24/2007  Job:  [id number]   cc:   Loraine Leriche A. Perini, M.D.
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
## 2011-04-10 NOTE — H&P (Signed)
[name], EMPEY NO.:  [id number]   MEDICAL RECORD NO.:  [id number]          PATIENT TYPE:  OUT   LOCATION:  XRAY                         FACILITY:  [facility]   PHYSICIAN:  Vesta Mixer, M.D. DATE OF BIRTH:  [birth year]   DATE OF ADMISSION:  08/24/2006  DATE OF DISCHARGE:  08/24/2006                              HISTORY & PHYSICAL   Conner Muegge is a 77 year old gentleman with a history of coronary artery  disease.  Now admitted to the hospital for heart catheterization after  having an abnormal stress Cardiolite study.   Mr. Vangorder has a history of coronary artery disease.  Status post PTCA  and stenting of his left anterior descending artery in 1999.  We also  performed angioplasty of diagonal artery.  He was noted to have a  moderate 60-70% stenosis of his left circumflex artery at that time.   He has overall been doing fairly well.  He has been under considerable  stress recently with the death of his wife.  He seems to be recovering  fairly nicely from that.  We performed a stress Cardiolite study  recently.  It revealed presence of lateral wall ischemia.  His left  ventricular systolic function was mildly reduced at 41%.   It is because of these changes that we decided to proceed with heart  catheterization.   CURRENT MEDICATIONS:  1. Glucophage 1000 mg p.o. b.i.d.  2. Aspirin 325 mg a day.  3. Multivitamin once a day.  4. Cinnamon 1 gram a day.  5. Imdur 30 mg a day.  6. Actos once a day.  7. Zocor 20 mg a day.  8. Lantus insulin 18 units q.h.s.  9. Fish oil once a day.  10.Insulin once a day.  11.Probiotic q.h.s.  12.Lexapro 5 mg a day.  13.Viagra as needed.   ALLERGIES:  None.   PAST MEDICAL HISTORY:  1. History of coronary artery disease.  2. Diabetes mellitus.  3. Dyslipidemia.   SOCIAL HISTORY:  The patient is a nonsmoker.   FAMILY HISTORY:  Is noncontributory.   REVIEW OF SYSTEMS:  Is reviewed and is essentially negative except  as  noted in HPI.  Specifically he denies any syncope or presyncope.  He  denies any PND or orthopnea.  He denies any heat or cold intolerance,  weight gain, weight loss.  He denies any easy bruisability.  He denies  any GU or GI problems.   PHYSICAL EXAMINATION:  GENERAL:  He is an elderly gentleman in no acute  distress.  He is alert and oriented x3 and his mood and affect are  normal.  VITAL SIGNS:  His weight is 202, blood pressure is 150/70 with heart  rate of 66.  HEENT: Exam reveals 2+ carotids. No bruits, no JVD, no thyromegaly.  LUNGS: Clear to auscultation.  HEART: Regular rate S1-S2.  ABDOMEN:  Reveals good bowel sounds and is nontender.  EXTREMITIES: There is no clubbing, cyanosis or edema.  NEUROLOGY:  Nonfocal.   I discussed the risks, benefits and options of heart catheterization.  He understands  and agrees to proceed.  We will schedule this for next  Wednesday.           ______________________________  Vesta Mixer, M.D.     PJN/MEDQ  D:  01/19/2007  T:  01/20/2007  Job:  [id number]   cc:   Loraine Leriche A. Perini, M.D.

## 2011-05-15 ENCOUNTER — Other Ambulatory Visit: Payer: Self-pay | Admitting: Cardiovascular Disease

## 2011-05-15 NOTE — Telephone Encounter (Signed)
Fax received from pharmacy. Refill completed. Jodette Jackilyn Umphlett RN  

## 2011-07-07 ENCOUNTER — Telehealth: Payer: Self-pay | Admitting: Cardiovascular Disease

## 2011-07-07 NOTE — Telephone Encounter (Signed)
msg left, has 3 refills available to her.

## 2011-07-07 NOTE — Telephone Encounter (Signed)
Called needing a refill of Plavix filled at [facility] on [address] [phone]. Please call back. I have pulled his chart.

## 2011-07-24 ENCOUNTER — Encounter: Payer: Self-pay | Admitting: Cardiovascular Disease

## 2011-08-07 ENCOUNTER — Ambulatory Visit (INDEPENDENT_AMBULATORY_CARE_PROVIDER_SITE_OTHER): Payer: Medicare Other | Admitting: Cardiovascular Disease

## 2011-08-07 ENCOUNTER — Encounter: Payer: Self-pay | Admitting: Cardiovascular Disease

## 2011-08-07 VITALS — BP 104/60 | HR 80 | Ht 74.0 in | Wt 218.0 lb

## 2011-08-07 DIAGNOSIS — I259 Chronic ischemic heart disease, unspecified: Secondary | ICD-10-CM

## 2011-08-07 NOTE — Progress Notes (Signed)
[name] Date of Birth  07-04-1927 [facility] [address]     [address] [place], [place]  [address] [phone]  Fax  [phone]  History of Present Illness:  77 yo gentleman with a history of coronary artery disease. Status post PTCA and stenting of his left ear descending artery and diagonal artery. He has a small stenosis in the left circumflex flexor event has been treated medically.  Has a history of prostate cancer and has been treated with radiation. He has a history of hyperlipidemia and diabetes mellitus.  He denies any chest pain or shortness of breath.  Current Outpatient Prescriptions on File Prior to Visit  Medication Sig Dispense Refill  . aspirin 325 MG tablet Take 325 mg by mouth daily.        . calcium citrate-vitamin D (CITRACAL+D) 315-200 MG-UNIT per tablet Take 1 tablet by mouth daily.        . cholecalciferol (VITAMIN D) 1000 UNITS tablet Take 1,000 Units by mouth daily.        Marland Kitchen CINNAMON PO Take 1,000 mg by mouth daily.        . clopidogrel (PLAVIX) 75 MG tablet Take 1 tablet (75 mg total) by mouth daily.  90 tablet  3  . escitalopram (LEXAPRO) 5 MG tablet Take 5 mg by mouth daily.        . fish oil-omega-3 fatty acids 1000 MG capsule Take 1 g by mouth daily.        . insulin aspart (NOVOLOG) 100 UNIT/ML injection Inject 30 Units into the skin 2 (two) times daily.        . insulin glargine (LANTUS) 100 UNIT/ML injection Inject 18 Units into the skin at bedtime.        . isosorbide mononitrate (IMDUR) 60 MG 24 hr tablet TAKE ONE-HALF TABLET EVERY MORNING  15 tablet  5  . metFORMIN (GLUCOPHAGE) 1000 MG tablet Take 1,000 mg by mouth 2 (two) times daily with a meal.        . Multiple Vitamin (MULTIVITAMIN) tablet Take 1 tablet by mouth daily.        . rosuvastatin (CRESTOR) 20 MG tablet Take 20 mg by mouth daily.        . vitamin B-12 (CYANOCOBALAMIN) 500 MCG tablet Take 500 mcg by mouth daily.          Allergies    Allergen Reactions  . Actos (Pioglitazone Hydrochloride)     Intolerance (swelling around lips)  . Sulfonamide Derivatives     Past Medical History  Diagnosis Date  . Coronary artery disease   . Diabetes mellitus   . Hyperlipidemia   . Prostate cancer     Past Surgical History  Procedure Date  . Cardiac catheterization 01/24/2007    EF 41%  . Cardiac catheterization 05/02/1999  . Coronary angioplasty with stent placement     LEFT ANTERIOR DESCENDING ARTERY AND DIAGONAL ARTERY  . Fracture surgery     LEFT ARM  . US echocardiography 07/07/2005    EF 55-60%  . Cardiovascular stress test 01/14/2007    EF 41%    History  Smoking status  . Never Smoker   Smokeless tobacco  . Not on file    History  Alcohol Use No    Family History  Problem Relation Age of Onset  . Coronary artery disease Father   . Heart failure Father     Reviw of Systems:  Reviewed in  the HPI.  All other systems are negative.  Physical Exam: BP 104/60  Pulse 80  Ht 6\' 2"  (1.88 m)  Wt 218 lb (98.884 kg)  BMI 27.99 kg/m2 The patient is alert and oriented x 3.  The mood and affect are normal.   Skin: warm and dry.  Color is normal.    HEENT:   the sclera are nonicteric.  The mucous membranes are moist.  The carotids are 2+ without bruits.  There is no thyromegaly.  There is no JVD.    Lungs: clear.  The chest wall is non tender.    Heart: regular rate with a normal S1 and S2.  There are no murmurs, gallops, or rubs. The PMI is not displaced.     Abdomen: good bowel sounds.  There is no guarding or rebound.  There is no hepatosplenomegaly or tenderness.  There are no masses.   Extremities:  no clubbing, cyanosis, or edema.  The legs are without rashes.  The distal pulses are intact.   Neuro:  Cranial nerves II - XII are intact.  Motor and sensory functions are intact.    The gait is normal.  ECG:  Assessment / Plan:

## 2011-08-07 NOTE — Assessment & Plan Note (Signed)
[name] is doing very well from a cardiac standpoint. He has not had any episodes of angina.  We will continue the same medications. I'll see him again in 6 months.

## 2011-08-24 LAB — COMPREHENSIVE METABOLIC PANEL
AST: 21
Albumin: 2.7 — ABNORMAL LOW
BUN: 17
Calcium: 8.8
Creatinine, Ser: 0.79
GFR calc Af Amer: >60
GFR calc non Af Amer: >60
Total Bilirubin: 1.3 — ABNORMAL HIGH

## 2011-08-24 LAB — DIFFERENTIAL
Basophils Absolute: 0
Eosinophils Relative: 3
Lymphocytes Relative: 27
Lymphs Abs: 1.9
Monocytes Absolute: 0.5
Neutro Abs: 4.2

## 2011-08-24 LAB — GLUCOSE, CAPILLARY
Glucose-Capillary: 216 — ABNORMAL HIGH
Glucose-Capillary: 231 — ABNORMAL HIGH
Glucose-Capillary: 248 — ABNORMAL HIGH
Glucose-Capillary: 261 — ABNORMAL HIGH

## 2011-08-24 LAB — CBC
HCT: 30.3 — ABNORMAL LOW
MCHC: 34
MCV: 107.4 — ABNORMAL HIGH
Platelets: 144 — ABNORMAL LOW

## 2011-08-26 LAB — COMPREHENSIVE METABOLIC PANEL
ALT: 19
AST: 23
AST: 26
Albumin: 2.8 — ABNORMAL LOW
Albumin: 2.9 — ABNORMAL LOW
Alkaline Phosphatase: 35 — ABNORMAL LOW
BUN: 21
CO2: 28
CO2: 33 — ABNORMAL HIGH
Calcium: 8 — ABNORMAL LOW
Calcium: 8.2 — ABNORMAL LOW
Calcium: 8.2 — ABNORMAL LOW
Calcium: 9
Chloride: 98
Creatinine, Ser: 0.77
Creatinine, Ser: 0.87
Creatinine, Ser: 1
GFR calc Af Amer: >60
GFR calc Af Amer: >60
GFR calc Af Amer: >60
GFR calc non Af Amer: >60
GFR calc non Af Amer: >60
Glucose, Bld: 224 — ABNORMAL HIGH
Potassium: 4.1
Sodium: 138
Total Bilirubin: 1.2
Total Protein: 5 — ABNORMAL LOW
Total Protein: 5.5 — ABNORMAL LOW

## 2011-08-26 LAB — CBC
HCT: 29.8 — ABNORMAL LOW
HCT: 30.1 — ABNORMAL LOW
HCT: 38.4 — ABNORMAL LOW
Hemoglobin: 11.5 — ABNORMAL LOW
Hemoglobin: 13.4
MCHC: 33.4
MCHC: 33.8
MCHC: 34.9
MCV: 105.8 — ABNORMAL HIGH
MCV: 106.3 — ABNORMAL HIGH
MCV: 107.4 — ABNORMAL HIGH
MCV: 108 — ABNORMAL HIGH
MCV: 108.3 — ABNORMAL HIGH
Platelets: 111 — ABNORMAL LOW
Platelets: 114 — ABNORMAL LOW
Platelets: 142 — ABNORMAL LOW
RBC: 2.69 — ABNORMAL LOW
RBC: 2.79 — ABNORMAL LOW
RBC: 3.26 — ABNORMAL LOW
RDW: 12.8
RDW: 13.4
RDW: 13.9
WBC: 6.7
WBC: 7.5
WBC: 8.5

## 2011-08-26 LAB — DIFFERENTIAL
Basophils Absolute: 0
Basophils Relative: 0
Basophils Relative: 0
Eosinophils Absolute: 0.1
Eosinophils Absolute: 0.2
Eosinophils Absolute: 0.2
Eosinophils Relative: 1
Eosinophils Relative: 2
Eosinophils Relative: 2
Lymphocytes Relative: 26
Lymphs Abs: 1.8
Lymphs Abs: 1.9
Lymphs Abs: 1.9
Monocytes Absolute: 0.6
Monocytes Absolute: 0.7
Monocytes Relative: 10
Monocytes Relative: 11
Neutro Abs: 4.5
Neutrophils Relative %: 62

## 2011-08-26 LAB — BASIC METABOLIC PANEL
BUN: 22
CO2: 25
Calcium: 8.3 — ABNORMAL LOW
Glucose, Bld: 280 — ABNORMAL HIGH
Sodium: 134 — ABNORMAL LOW

## 2011-08-26 LAB — GLUCOSE, CAPILLARY
Glucose-Capillary: 158 — ABNORMAL HIGH
Glucose-Capillary: 201 — ABNORMAL HIGH
Glucose-Capillary: 227 — ABNORMAL HIGH
Glucose-Capillary: 227 — ABNORMAL HIGH
Glucose-Capillary: 232 — ABNORMAL HIGH
Glucose-Capillary: 237 — ABNORMAL HIGH
Glucose-Capillary: 239 — ABNORMAL HIGH
Glucose-Capillary: 244 — ABNORMAL HIGH
Glucose-Capillary: 253 — ABNORMAL HIGH
Glucose-Capillary: 257 — ABNORMAL HIGH
Glucose-Capillary: 259 — ABNORMAL HIGH
Glucose-Capillary: 297 — ABNORMAL HIGH

## 2011-08-26 LAB — POCT CARDIAC MARKERS: Myoglobin, poc: 221

## 2011-08-26 LAB — POCT I-STAT, CHEM 8
BUN: 29 — ABNORMAL HIGH
Creatinine, Ser: 0.9
Glucose, Bld: 128 — ABNORMAL HIGH
Hemoglobin: 13.9
Sodium: 140
TCO2: 25

## 2011-08-26 LAB — CROSSMATCH: Antibody Screen: NEGATIVE

## 2011-08-26 LAB — HEMOGLOBIN A1C: Hgb A1c MFr Bld: 6.9 — ABNORMAL HIGH

## 2011-10-04 ENCOUNTER — Encounter (HOSPITAL_BASED_OUTPATIENT_CLINIC_OR_DEPARTMENT_OTHER): Payer: Self-pay | Admitting: *Deleted

## 2011-10-04 ENCOUNTER — Inpatient Hospital Stay (HOSPITAL_BASED_OUTPATIENT_CLINIC_OR_DEPARTMENT_OTHER)
Admission: EM | Admit: 2011-10-04 | Discharge: 2011-10-07 | DRG: 195 | Disposition: A | Discharge status: Skilled Nursing Facility | Payer: Medicare Other | Attending: Internal Medicine | Admitting: Internal Medicine

## 2011-10-04 ENCOUNTER — Other Ambulatory Visit: Payer: Self-pay

## 2011-10-04 ENCOUNTER — Emergency Department (INDEPENDENT_AMBULATORY_CARE_PROVIDER_SITE_OTHER): Payer: Medicare Other

## 2011-10-04 ENCOUNTER — Observation Stay (HOSPITAL_COMMUNITY): Payer: Medicare Other

## 2011-10-04 DIAGNOSIS — R0902 Hypoxemia: Secondary | ICD-10-CM

## 2011-10-04 DIAGNOSIS — J189 Pneumonia, unspecified organism: Principal | ICD-10-CM | POA: Diagnosis present

## 2011-10-04 DIAGNOSIS — M810 Age-related osteoporosis without current pathological fracture: Secondary | ICD-10-CM

## 2011-10-04 DIAGNOSIS — R5381 Other malaise: Secondary | ICD-10-CM

## 2011-10-04 DIAGNOSIS — R0602 Shortness of breath: Secondary | ICD-10-CM

## 2011-10-04 DIAGNOSIS — I10 Essential (primary) hypertension: Secondary | ICD-10-CM

## 2011-10-04 DIAGNOSIS — R2681 Unsteadiness on feet: Secondary | ICD-10-CM

## 2011-10-04 DIAGNOSIS — I798 Other disorders of arteries, arterioles and capillaries in diseases classified elsewhere: Secondary | ICD-10-CM | POA: Diagnosis present

## 2011-10-04 DIAGNOSIS — R05 Cough: Secondary | ICD-10-CM

## 2011-10-04 DIAGNOSIS — R269 Unspecified abnormalities of gait and mobility: Secondary | ICD-10-CM | POA: Diagnosis present

## 2011-10-04 DIAGNOSIS — E785 Hyperlipidemia, unspecified: Secondary | ICD-10-CM | POA: Diagnosis present

## 2011-10-04 DIAGNOSIS — I2510 Atherosclerotic heart disease of native coronary artery without angina pectoris: Secondary | ICD-10-CM

## 2011-10-04 DIAGNOSIS — R739 Hyperglycemia, unspecified: Secondary | ICD-10-CM

## 2011-10-04 DIAGNOSIS — Z9861 Coronary angioplasty status: Secondary | ICD-10-CM

## 2011-10-04 DIAGNOSIS — E1159 Type 2 diabetes mellitus with other circulatory complications: Secondary | ICD-10-CM | POA: Diagnosis present

## 2011-10-04 DIAGNOSIS — R5383 Other fatigue: Secondary | ICD-10-CM

## 2011-10-04 LAB — BASIC METABOLIC PANEL
Chloride: 98 mEq/L (ref 96–112)
GFR calc Af Amer: >90 mL/min (ref >90)
GFR calc non Af Amer: 86 mL/min — ABNORMAL LOW (ref >90)
Glucose, Bld: 377 mg/dL — ABNORMAL HIGH (ref 70–99)
Potassium: 4.4 mEq/L (ref 3.5–5.1)
Sodium: 134 mEq/L — ABNORMAL LOW (ref 135–145)

## 2011-10-04 LAB — CBC
HCT: 36.4 % — ABNORMAL LOW (ref 39.0–52.0)
Hemoglobin: 12.4 g/dL — ABNORMAL LOW (ref 13.0–17.0)
Hemoglobin: 11.9 g/dL — ABNORMAL LOW (ref 13.0–17.0)
MCH: 34.3 pg — ABNORMAL HIGH (ref 26.0–34.0)
MCHC: 34.1 g/dL (ref 30.0–36.0)
MCV: 99.7 fL (ref 78.0–100.0)
WBC: 9.9 10*3/uL (ref 4.0–10.5)

## 2011-10-04 LAB — URINALYSIS, ROUTINE W REFLEX MICROSCOPIC
Glucose, UA: >1000 mg/dL — AB
Ketones, ur: NEGATIVE mg/dL
Leukocytes, UA: NEGATIVE
Nitrite: NEGATIVE
Specific Gravity, Urine: 1.039 — ABNORMAL HIGH (ref 1.005–1.030)
pH: 5.5 (ref 5.0–8.0)

## 2011-10-04 LAB — COMPREHENSIVE METABOLIC PANEL
ALT: 20 U/L (ref 0–53)
CO2: 28 mEq/L (ref 19–32)
Calcium: 9.6 mg/dL (ref 8.4–10.5)
Chloride: 99 mEq/L (ref 96–112)
GFR calc Af Amer: >90 mL/min (ref >90)
GFR calc non Af Amer: 81 mL/min — ABNORMAL LOW (ref >90)
Glucose, Bld: 330 mg/dL — ABNORMAL HIGH (ref 70–99)
Sodium: 134 mEq/L — ABNORMAL LOW (ref 135–145)
Total Bilirubin: 0.3 mg/dL (ref 0.3–1.2)

## 2011-10-04 LAB — GLUCOSE, CAPILLARY: Glucose-Capillary: 372 mg/dL — ABNORMAL HIGH (ref 70–99)

## 2011-10-04 LAB — DIFFERENTIAL
Eosinophils Relative: 4 % (ref 0–5)
Lymphocytes Relative: 47 % — ABNORMAL HIGH (ref 12–46)
Lymphs Abs: 4.2 10*3/uL — ABNORMAL HIGH (ref 0.7–4.0)
Monocytes Absolute: 0.7 10*3/uL (ref 0.1–1.0)

## 2011-10-04 LAB — PRO B NATRIURETIC PEPTIDE: Pro B Natriuretic peptide (BNP): 126.3 pg/mL (ref 0–450)

## 2011-10-04 LAB — URINE MICROSCOPIC-ADD ON

## 2011-10-04 MED ORDER — OMEGA-3-ACID ETHYL ESTERS 1 G PO CAPS
1.0000 g | ORAL_CAPSULE | Freq: Two times a day (BID) | ORAL | Status: DC
  Administered 2011-10-04 – 2011-10-06 (×5): 1 g via ORAL
  Filled 2011-10-04 (×8): qty 1

## 2011-10-04 MED ORDER — ONE-DAILY MULTI VITAMINS PO TABS: 1.0000 | ORAL_TABLET | Freq: Every day | ORAL | Status: DC

## 2011-10-04 MED ORDER — DEXTROSE 5 % IV SOLN
1.0000 g | Freq: Once | INTRAVENOUS | Status: AC
  Administered 2011-10-04: 1 g via INTRAVENOUS
  Filled 2011-10-04: qty 10

## 2011-10-04 MED ORDER — ENOXAPARIN SODIUM 40 MG/0.4ML ~~LOC~~ SOLN
40.0000 mg | SUBCUTANEOUS | Status: DC
  Administered 2011-10-04 – 2011-10-06 (×3): 40 mg via SUBCUTANEOUS
  Filled 2011-10-04 (×5): qty 0.4

## 2011-10-04 MED ORDER — SENNA 8.6 MG PO TABS
2.0000 | ORAL_TABLET | Freq: Every day | ORAL | Status: DC | PRN
  Filled 2011-10-04: qty 2

## 2011-10-04 MED ORDER — ZOLPIDEM TARTRATE 5 MG PO TABS: 5.0000 mg | ORAL_TABLET | Freq: Every evening | ORAL | Status: DC | PRN

## 2011-10-04 MED ORDER — INSULIN ASPART 100 UNIT/ML ~~LOC~~ SOLN
0.0000 [IU] | Freq: Every day | SUBCUTANEOUS | Status: DC
  Administered 2011-10-04: 2 [IU] via SUBCUTANEOUS

## 2011-10-04 MED ORDER — ACETAMINOPHEN 650 MG RE SUPP: 650.0000 mg | Freq: Four times a day (QID) | RECTAL | Status: DC | PRN

## 2011-10-04 MED ORDER — INSULIN ASPART 100 UNIT/ML ~~LOC~~ SOLN
14.0000 [IU] | Freq: Three times a day (TID) | SUBCUTANEOUS | Status: DC
  Administered 2011-10-05 – 2011-10-06 (×6): 14 [IU] via SUBCUTANEOUS

## 2011-10-04 MED ORDER — CALCIUM CARBONATE-VITAMIN D 500-200 MG-UNIT PO TABS
1.0000 | ORAL_TABLET | Freq: Every day | ORAL | Status: DC
  Administered 2011-10-05: 1 via ORAL
  Administered 2011-10-06: 10:00:00 via ORAL
  Filled 2011-10-04 (×3): qty 1

## 2011-10-04 MED ORDER — ENOXAPARIN SODIUM 40 MG/0.4ML ~~LOC~~ SOLN: 40.0000 mg | SUBCUTANEOUS | Status: DC

## 2011-10-04 MED ORDER — DM-GUAIFENESIN ER 30-600 MG PO TB12
1.0000 | ORAL_TABLET | Freq: Two times a day (BID) | ORAL | Status: DC | PRN
Start: 2011-10-04 — End: 2011-10-04
  Filled 2011-10-04: qty 1

## 2011-10-04 MED ORDER — SALINE SPRAY 0.65 % NA SOLN
1.0000 | NASAL | Status: DC | PRN
  Filled 2011-10-04: qty 44

## 2011-10-04 MED ORDER — VITAMIN D3 25 MCG (1000 UNIT) PO TABS
1000.0000 [IU] | ORAL_TABLET | Freq: Every day | ORAL | Status: DC
  Administered 2011-10-05 – 2011-10-06 (×2): 1000 [IU] via ORAL
  Filled 2011-10-04 (×3): qty 1

## 2011-10-04 MED ORDER — SODIUM CHLORIDE 0.9 % IJ SOLN
3.0000 mL | Freq: Two times a day (BID) | INTRAMUSCULAR | Status: DC
  Administered 2011-10-04: 3 mL via INTRAVENOUS

## 2011-10-04 MED ORDER — SODIUM CHLORIDE 0.9 % IV BOLUS (SEPSIS)
500.0000 mL | Freq: Once | INTRAVENOUS | Status: AC
  Administered 2011-10-04: 500 mL via INTRAVENOUS

## 2011-10-04 MED ORDER — DEXTROSE 5 % IV SOLN
1.0000 g | INTRAVENOUS | Status: DC
  Administered 2011-10-05 – 2011-10-06 (×2): 1 g via INTRAVENOUS
  Filled 2011-10-04 (×4): qty 10

## 2011-10-04 MED ORDER — IOHEXOL 350 MG/ML SOLN
100.0000 mL | Freq: Once | INTRAVENOUS | Status: AC | PRN
  Administered 2011-10-04: 100 mL via INTRAVENOUS

## 2011-10-04 MED ORDER — ASPIRIN EC 81 MG PO TBEC
162.0000 mg | DELAYED_RELEASE_TABLET | Freq: Every day | ORAL | Status: DC
  Administered 2011-10-04: 81 mg via ORAL
  Administered 2011-10-05 – 2011-10-06 (×2): 162 mg via ORAL
  Filled 2011-10-04 (×4): qty 2

## 2011-10-04 MED ORDER — TAMSULOSIN HCL 0.4 MG PO CAPS
0.4000 mg | ORAL_CAPSULE | ORAL | Status: DC
  Administered 2011-10-05 – 2011-10-06 (×2): 0.4 mg via ORAL
  Filled 2011-10-04: qty 1

## 2011-10-04 MED ORDER — ALBUTEROL SULFATE (5 MG/ML) 0.5% IN NEBU: 2.5000 mg | INHALATION_SOLUTION | RESPIRATORY_TRACT | Status: DC | PRN

## 2011-10-04 MED ORDER — GUAIFENESIN-DM 100-10 MG/5ML PO SYRP: 5.0000 mL | ORAL_SOLUTION | ORAL | Status: DC | PRN

## 2011-10-04 MED ORDER — INSULIN ASPART 100 UNIT/ML ~~LOC~~ SOLN
0.0000 [IU] | Freq: Three times a day (TID) | SUBCUTANEOUS | Status: DC
  Administered 2011-10-05: 7 [IU] via SUBCUTANEOUS
  Administered 2011-10-05: 4 [IU] via SUBCUTANEOUS
  Administered 2011-10-05: 11 [IU] via SUBCUTANEOUS
  Administered 2011-10-06: 3 [IU] via SUBCUTANEOUS
  Administered 2011-10-07: 4 [IU] via SUBCUTANEOUS
  Filled 2011-10-04: qty 3

## 2011-10-04 MED ORDER — DEXTROSE 5 % IV SOLN
500.0000 mg | Freq: Once | INTRAVENOUS | Status: DC
  Filled 2011-10-04: qty 500

## 2011-10-04 MED ORDER — ACETAMINOPHEN 325 MG PO TABS: 650.0000 mg | ORAL_TABLET | Freq: Four times a day (QID) | ORAL | Status: DC | PRN

## 2011-10-04 MED ORDER — ROSUVASTATIN CALCIUM 20 MG PO TABS
20.0000 mg | ORAL_TABLET | Freq: Every day | ORAL | Status: DC
  Administered 2011-10-05 – 2011-10-06 (×2): 20 mg via ORAL
  Filled 2011-10-04 (×3): qty 1

## 2011-10-04 MED ORDER — VITAMIN B-12 500 MCG PO TABS
500.0000 ug | ORAL_TABLET | Freq: Every day | ORAL | Status: DC
  Administered 2011-10-05 – 2011-10-06 (×2): 500 ug via ORAL
  Filled 2011-10-04 (×4): qty 1

## 2011-10-04 MED ORDER — ESCITALOPRAM OXALATE 5 MG PO TABS
5.0000 mg | ORAL_TABLET | Freq: Every day | ORAL | Status: DC
  Administered 2011-10-05 – 2011-10-06 (×2): 5 mg via ORAL
  Filled 2011-10-04 (×3): qty 1

## 2011-10-04 MED ORDER — CLOPIDOGREL BISULFATE 75 MG PO TABS
75.0000 mg | ORAL_TABLET | Freq: Every day | ORAL | Status: DC
  Administered 2011-10-04 – 2011-10-06 (×3): 75 mg via ORAL
  Filled 2011-10-04 (×4): qty 1

## 2011-10-04 MED ORDER — INSULIN REGULAR HUMAN 100 UNIT/ML IJ SOLN
INTRAMUSCULAR | Status: AC
  Administered 2011-10-04: 11 [IU]
  Filled 2011-10-04: qty 33

## 2011-10-04 MED ORDER — ASPIRIN 325 MG PO TABS: 81.0000 mg | ORAL_TABLET | Freq: Every day | ORAL | Status: DC

## 2011-10-04 MED ORDER — SODIUM CHLORIDE 0.9 % IV SOLN: 250.0000 mL | INTRAVENOUS | Status: DC

## 2011-10-04 MED ORDER — ISOSORBIDE MONONITRATE ER 30 MG PO TB24
30.0000 mg | ORAL_TABLET | Freq: Every day | ORAL | Status: DC
Start: 2011-10-05 — End: 2011-10-07
  Administered 2011-10-05 – 2011-10-06 (×2): 30 mg via ORAL
  Filled 2011-10-04 (×3): qty 1

## 2011-10-04 MED ORDER — OMEGA-3 FATTY ACIDS 1000 MG PO CAPS: 1.0000 g | ORAL_CAPSULE | Freq: Every day | ORAL | Status: DC

## 2011-10-04 MED ORDER — INSULIN ASPART 100 UNIT/ML ~~LOC~~ SOLN
11.0000 [IU] | Freq: Once | SUBCUTANEOUS | Status: DC
  Filled 2011-10-04: qty 3

## 2011-10-04 MED ORDER — CALCIUM CITRATE-VITAMIN D 315-200 MG-UNIT PO TABS: 1.0000 | ORAL_TABLET | Freq: Every day | ORAL | Status: DC

## 2011-10-04 MED ORDER — THERA M PLUS PO TABS
1.0000 | ORAL_TABLET | Freq: Every day | ORAL | Status: DC
  Administered 2011-10-05 – 2011-10-06 (×2): 1 via ORAL
  Filled 2011-10-04 (×3): qty 1

## 2011-10-04 MED ORDER — SODIUM CHLORIDE 0.9 % IJ SOLN: 3.0000 mL | INTRAMUSCULAR | Status: DC | PRN

## 2011-10-04 MED ORDER — METFORMIN HCL 500 MG PO TABS
1000.0000 mg | ORAL_TABLET | Freq: Two times a day (BID) | ORAL | Status: DC
  Filled 2011-10-04 (×4): qty 2

## 2011-10-04 MED ORDER — AZITHROMYCIN 500 MG IV SOLR
500.0000 mg | INTRAVENOUS | Status: DC
  Administered 2011-10-05 – 2011-10-06 (×2): 500 mg via INTRAVENOUS
  Filled 2011-10-04 (×3): qty 500

## 2011-10-04 MED ORDER — INSULIN GLARGINE 100 UNIT/ML ~~LOC~~ SOLN
34.0000 [IU] | Freq: Two times a day (BID) | SUBCUTANEOUS | Status: DC
  Administered 2011-10-04 – 2011-10-06 (×5): 34 [IU] via SUBCUTANEOUS
  Filled 2011-10-04 (×3): qty 3

## 2011-10-04 MED ORDER — HYDROCODONE-ACETAMINOPHEN 5-325 MG PO TABS: 1.0000 | ORAL_TABLET | ORAL | Status: DC | PRN

## 2011-10-04 NOTE — ED Provider Notes (Signed)
History     CSN: [id number] Arrival date & time: 10/04/2011 11:17 AM   First MD Initiated Contact with Patient 10/04/11 1153      Chief Complaint  Patient presents with  . URI    cough and left lower rib pain with coughing.    (Consider location/radiation/quality/duration/timing/severity/associated sxs/prior treatment) HPI Comments: Pt states that he started with a basic cold about a week ago, but in the last 4 days he has been increasingly weak and tired:pt states that he has left lower rib pain with coughing  Patient is a 77 y.o. male presenting with URI. The history is provided by the patient. No language interpreter was used.  URI The primary symptoms include fatigue and cough. Primary symptoms do not include fever, ear pain, sore throat, wheezing, nausea or vomiting. The current episode started 6 to 7 days ago. This is a new problem. The problem has been gradually worsening.  Symptoms associated with the illness include congestion. Risk factors for severe complications from URI include being elderly.    Past Medical History  Diagnosis Date  . Coronary artery disease   . Diabetes mellitus   . Hyperlipidemia   . Prostate cancer     Past Surgical History  Procedure Date  . Cardiac catheterization 01/24/2007    EF 41%  . Cardiac catheterization 05/02/1999  . Coronary angioplasty with stent placement     LEFT ANTERIOR DESCENDING ARTERY AND DIAGONAL ARTERY  . Fracture surgery     LEFT ARM  . US echocardiography 07/07/2005    EF 55-60%  . Cardiovascular stress test 01/14/2007    EF 41%    Family History  Problem Relation Age of Onset  . Coronary artery disease Father   . Heart failure Father     History  Substance Use Topics  . Smoking status: Never Smoker   . Smokeless tobacco: Not on file  . Alcohol Use: Yes     occassionally      Review of Systems  Constitutional: Positive for fatigue. Negative for fever.  HENT: Positive for congestion. Negative for ear  pain and sore throat.   Respiratory: Positive for cough. Negative for wheezing.   Gastrointestinal: Negative for nausea and vomiting.  All other systems reviewed and are negative.    Allergies  Actos and Sulfonamide derivatives  Home Medications   Current Outpatient Rx  Name Route Sig Dispense Refill  . ASPIRIN 325 MG PO TABS Oral Take 81 mg by mouth daily.     Marland Kitchen CALCIUM CITRATE-VITAMIN D 315-200 MG-UNIT PO TABS Oral Take 1 tablet by mouth daily.      Marland Kitchen VITAMIN D 1000 UNITS PO TABS Oral Take 1,000 Units by mouth daily.      Marland Kitchen CINNAMON PO Oral Take 1,000 mg by mouth daily.      Marland Kitchen CLOPIDOGREL BISULFATE 75 MG PO TABS Oral Take 1 tablet (75 mg total) by mouth daily. 90 tablet 3  . ESCITALOPRAM OXALATE 5 MG PO TABS Oral Take 5 mg by mouth daily.      . OMEGA-3 FATTY ACIDS 1000 MG PO CAPS Oral Take 1 g by mouth daily.      . INSULIN ASPART 100 UNIT/ML [place] SOLN Subcutaneous Inject 35 Units into the skin 2 (two) times daily.     . INSULIN GLARGINE 100 UNIT/ML [place] SOLN Subcutaneous Inject 18 Units into the skin 2 (two) times daily.     . ISOSORBIDE MONONITRATE ER 60 MG PO TB24  TAKE ONE-HALF TABLET  EVERY MORNING 15 tablet 5  . METFORMIN HCL 1000 MG PO TABS Oral Take 1,000 mg by mouth 2 (two) times daily with a meal.      . ONE-DAILY MULTI VITAMINS PO TABS Oral Take 1 tablet by mouth daily.      Marland Kitchen ROSUVASTATIN CALCIUM 20 MG PO TABS Oral Take 20 mg by mouth daily.      Marland Kitchen VITAMIN B-12 500 MCG PO TABS Oral Take 500 mcg by mouth daily.        BP 131/62  Pulse 62  Temp(Src) 97.6 F (36.4 C) (Oral)  Resp 18  Ht 6\' 2"  (1.88 m)  Wt 214 lb (97.07 kg)  BMI 27.48 kg/m2  SpO2 92%  Physical Exam  Nursing note and vitals reviewed. Constitutional: He is oriented to person, place, and time. He appears well-developed and well-nourished.  HENT:  Head: Normocephalic.  Right Ear: External ear normal.  Left Ear: External ear normal.  Eyes: Pupils are equal, round, and reactive to light.  Neck: Normal  range of motion.  Pulmonary/Chest: Effort normal. He has rales.  Abdominal: Soft. Bowel sounds are normal.  Musculoskeletal: Normal range of motion.  Neurological: He is alert and oriented to person, place, and time.  Psychiatric: He has a normal mood and affect.    ED Course  Procedures (including critical care time)  Labs Reviewed  URINALYSIS, ROUTINE W REFLEX MICROSCOPIC - Abnormal; Notable for the following:    Specific Gravity, Urine 1.039 (*)    Glucose, UA >1000 (*)    All other components within normal limits  CBC - Abnormal; Notable for the following:    RBC 3.66 (*)    Hemoglobin 12.4 (*)    HCT 36.4 (*)    Platelets 141 (*)    All other components within normal limits  BASIC METABOLIC PANEL - Abnormal; Notable for the following:    Sodium 134 (*)    Glucose, Bld 377 (*)    GFR calc non Af Amer 86 (*)    All other components within normal limits  URINE MICROSCOPIC-ADD ON  CULTURE, BLOOD (ROUTINE X 2)  CULTURE, BLOOD (ROUTINE X 2)  PRO B NATRIURETIC PEPTIDE   Dg Chest 2 View  10/04/2011  *RADIOLOGY REPORT*  Clinical Data: Cough, congestion, shortness of breath  CHEST - 2 VIEW  Comparison: 12/01/2009  Findings: The lungs are essentially clear. No pleural effusion or pneumothorax.  Cardiomediastinal silhouette is within normal limits.  Degenerative changes of the visualized thoracolumbar spine.  IMPRESSION: No evidence of acute cardiopulmonary disease.  Original Report Authenticated By: Charline Bills, M.D.     1. Hypoxia   2. Cough       MDM  Considered pe although pt is not tachycardic and is not having cp or ZOX:WRUEA with Dr. Wylene Simmer and [facility] to be [facility]        Teressa Lower, NP 10/04/11 1450

## 2011-10-04 NOTE — ED Notes (Signed)
[name]  Daughters Cell Phone [phone].

## 2011-10-04 NOTE — H&P (Signed)
NAMERAYNE, [name] NO.:  [id number]  MEDICAL RECORD NO.:  [id number]  LOCATION:  [facility]                         FACILITY:  [facility]  PHYSICIAN:  Gaspar Garbe, M.D.DATE OF BIRTH:  [birth year]  DATE OF ADMISSION:  10/04/2011 DATE OF DISCHARGE:                             HISTORY & PHYSICAL   CHIEF COMPLAINT:  Cough, not feeling well.  HISTORY OF PRESENT ILLNESS:  The patient is an 77 year old white male, patient of Dr. Rodrigo Ran at [facility], who has not been feeling well for the better part of a week.  He was seen in the office on the 11th by our diabetic educator for adjustment in his insulin doses.  Please see below for noted adjustments.  He had a cough at that time for 3 or 4 days, and then started some over-the-counter Mucinex.  Over the course of the weekend,  he seemed to be getting little bit worse and because the advice of his "special friend" and a couple of other people with [facility].  His daughter took him to the [facility], where he was evaluated and underwent a chest x-ray which showed no acuity.  Laboratory tests showing a white count of 9.9. It was thought to have sats in the 92% on 2 L.  I was called to admit the patient for possible pneumonia and oxygen requirement.  The patient was transferred from [facility] to [facility] Telemetry Floor where I saw the patient he appeared to be in absolutely no distress.  At that time, speaking in full sentences, not working to breathe, and indicated that he has had some pain on the right side of his chest for the better part of a week.  He indicates that he is eating reasonably well, has not had any falls or trauma, and has not been otherwise worse for where other than for some respiratory congestion and cough.  Denies any fluid retention or any changes in his medications other than those done as below.  ALLERGIES:  SULFA and JANUVIA.  MEDICATIONS: 1. Fish oil. 2.  B12 tablets. 3. Calcium with vitamin D. 4. MiraLax powder. 5. Cinnamon. 6. Imdur 30 mg daily. 7. Crestor 20 mg daily. 8. Lexapro 10 mg daily. 9. Aspirin 81 mg daily. 10.Reclast once yearly in February by infusion. 11.Metformin 1 g b.i.d. with food. 12.Flomax 0.4 mg daily. 13.Plavix 75 mg daily. 14.Lantus 35 units b.i.d. 15.NovoLog 14 with each meal plus sliding scale.  PAST MEDICAL HISTORY: 1. History of rib fracture on November 28, 2008. 2. Diabetes mellitus type 2. 3. Hypertension. 4. Prostate cancer in 2009, status post radiation therapy x40 doses,     followed by Dr. Chipper Herb with Radiation Oncology. 5. Osteoporosis with trivially fractures. 6. Coronary artery disease. 7. Hyperlipidemia. 8. Erectile dysfunction. 9. Retinal detachment x3. 10.BPH. 11.Situational stress. 12.History of shingles. 13.History of squamous cell cancer, head. 14.COPD. 15.Left humerus fracture. 16.Left radius fracture. 17.History of radiation proctitis, status post colonoscopy with Argon     beam cauterization.  PAST SURGICAL HISTORY:  Tonsils and adenoids.  SOCIAL HISTORY:  The patient is widowed in 2006.  He has 3 children.  He attended [employer].  He is a retired Clinical research associate.  He is a former smoker for college and drinks 2 drinks per week.  FAMILY HISTORY:  Father deceased at 90 of congestive heart failure with a history of diabetes on the paternal side.  Mother deceased at age 86, lives on long lives on the maternal side with no siblings so they are otherwise healthy.  REVIEW OF SYSTEMS:  The patient denies any overt shortness of breath. Denies any fevers or chills.  He does have some cough with some mildly productive sputum but this has been fairly stable over the course of a week.  Denies any chest pain at this point, but when he takes a deep breath, then he hurts in his right ribs.  Abdomen was not bothersome and his appetite was good.  Using a full breakfast this morning.  He has  not complained of any other deficits and has chronic skin changes with precancerous condition.  Review of systems is otherwise negative.  CODE STATUS:  The patient full code.  PHYSICAL EXAMINATION:  VITAL SIGNS:  Temperature 98.2, pulse 67, respiratory rate 20, blood pressure 108/49, and satting 93% on 2 L. GENERAL:  No acute distress, taking full deep breaths, and does not look labored at all. HEENT:  Normocephalic and atraumatic.  PERRLA.  EOMI.  ENT is within normal limits. NECK:  Supple.  No lymphadenopathy, JVD or bruit. HEART:  Mild grade 1-2/6 systolic ejection murmur at right upper sternal border. LUNGS:  Clear to auscultation bilaterally. ABDOMEN:  Soft, nontender, normoactive bowel sounds.  EXTREMITIES:  No clubbing, cyanosis, or edema noted. NEURO:  The patient is oriented to person, place, and time without deficits. SKIN:  The patient has multiple areas of former cancer removal in precancers areas on his chest as well as multiple skin tags. MUSCULOSKELETAL:  No joint deformities noted.  LABORATORY TESTS:  White count 9.9, hemoglobin 12.4, hematocrit 36.4, platelets 141, sodium slightly decreased to 134, potassium 4.4, BUN and creatinine are normal at 19 and 0.7.  Urinalysis shows presence of glucose, but no evidence of infection.  BNP was added on by myself and found to be 126.3.  Twelve lead EKG is unchanged from prior.  Chest x- ray shows no acuity.  ASSESSMENT AND PLAN: 1. Shortness of breath requiring oxygen, I may bit skeptical at the     initial assessment from the [facility], it was performed     by a midlevel provider and was initially billed as possible     pneumonia.  He lacks chest x-ray findings, fever, or white count     which are the hallmarks of this diagnosis.  I have added a BNP test     done which is essentially normal and he is not showing any signs of     congestive heart failure.  In fact, his lungs sound quite clear.  I     have  added on a D-dimer which is currently pending.  If it is     elevated, we will assess him for possible pulmonary embolism.  He     may be less active given his LS and he also has had a history for     cancer, this one was looking into and which had been done at the     [facility].  If all of these tests are negative, a     repeat chest x-ray in the morning are okay.  The patient  will most     likely be able to be titrated off of oxygen as at home, possibly     with an oral antibiotic.  If not pass off oxygen could possibly go     home with portable oxygen as well.  I find his condition to be     otherwise stable at this time. 2. Coronary artery disease.  Continue Imdur. 3. Hyperlipidemia, continue Crestor. 4. Diabetes mellitus type 2.  Last A1c was 8.5, and he received     appropriate treatment in our office with counseling and adjustments     in his insulin regimen.  He had been taking into effect with his     admission orders as written by myself. 5. Osteoporosis.  The patient received Reclast and is on appropriate     calcium with vitamin D. 6. BPH.  His Flomax was not missing initially from his intake     medication list that was done at [facility], I have     asked this back on. 7. Upper respiratory infection, this is possibly a viral.  The patient     will continue his Rocephin and azithromycin which has already been     received, his next dose will be due tomorrow afternoon.  I will     leave it to Dr. Waynard Edwards, who will be seeing the patient in the     morning to decide on further treatment for him.  He has been given     medications for symptomatic treatment as well.     Gaspar Garbe, M.D.     RWT/MEDQ  D:  10/04/2011  T:  10/04/2011  Job:  [id number]

## 2011-10-04 NOTE — ED Provider Notes (Signed)
Medical screening examination/treatment/procedure(s) were conducted as a shared visit with non-physician practitioner(s) and myself.  I personally evaluated the patient during the encounter  RRR, no mrg. Min L basilar crackles.  Forbes Cellar, MD 10/04/11 [phone]

## 2011-10-04 NOTE — ED Notes (Signed)
Patient states he developed a head cold >one week ago and has a productive cough in the morning with white/greenish secretions.  Now has some pain in the left lower ribs with coughing.  Symptoms are associated with decreased energy and no fever.

## 2011-10-05 ENCOUNTER — Observation Stay (HOSPITAL_COMMUNITY): Payer: Medicare Other

## 2011-10-05 DIAGNOSIS — J189 Pneumonia, unspecified organism: Secondary | ICD-10-CM | POA: Diagnosis present

## 2011-10-05 DIAGNOSIS — R2681 Unsteadiness on feet: Secondary | ICD-10-CM | POA: Diagnosis present

## 2011-10-05 LAB — GLUCOSE, CAPILLARY
Glucose-Capillary: 216 mg/dL — ABNORMAL HIGH (ref 70–99)
Glucose-Capillary: 183 mg/dL — ABNORMAL HIGH (ref 70–99)
Glucose-Capillary: 260 mg/dL — ABNORMAL HIGH (ref 70–99)
Glucose-Capillary: 126 mg/dL — ABNORMAL HIGH (ref 70–99)

## 2011-10-05 MED ORDER — METFORMIN HCL 500 MG PO TABS
1000.0000 mg | ORAL_TABLET | Freq: Two times a day (BID) | ORAL | Status: DC
  Filled 2011-10-05 (×3): qty 2

## 2011-10-05 MED ORDER — FLORA-Q PO CAPS
1.0000 | ORAL_CAPSULE | Freq: Every day | ORAL | Status: DC
  Administered 2011-10-05 – 2011-10-06 (×2): 1 via ORAL
  Filled 2011-10-05 (×3): qty 1

## 2011-10-05 NOTE — Progress Notes (Signed)
Physical Therapy Evaluation Patient Details Name: [name] MRN: [id number] DOB: 1926/12/29 Today's Date: 10/05/2011  Problem List:  Patient Active Problem List  Diagnoses  . CORONARY ARTERY DISEASE, S/P PTCA  . RADIATION PROCTITIS  . Hypoxemia requiring supplemental oxygen  . Type II or unspecified type diabetes mellitus with peripheral circulatory disorders, uncontrolled  . CAD (coronary artery disease)  . Essential hypertension, benign  . Other and unspecified hyperlipidemia  . Osteoporosis  . Pneumonia  . Gait instability    Past Medical History:  Past Medical History  Diagnosis Date  . Coronary artery disease   . Diabetes mellitus   . Hyperlipidemia   . Prostate cancer    Past Surgical History:  Past Surgical History  Procedure Date  . Cardiac catheterization 01/24/2007    EF 41%  . Cardiac catheterization 05/02/1999  . US echocardiography 07/07/2005    EF 55-60%  . Cardiovascular stress test 01/14/2007    EF 41%  . Coronary angioplasty with stent placement     LEFT ANTERIOR DESCENDING ARTERY AND DIAGONAL ARTERY    PT Assessment/Plan/Recommendation PT Assessment Clinical Impression Statement: Pt has had physical therapy twice in last year which he said has helped him. Pt walked today 400 feet with rolling walker and min assist.  O2 sats remained greater than or equal to 95% on room air through out session.  pt with right foot drop - he says this has never been assessed but when he looses his balance it is due to this ankle rolling.  Pt  did  ankle exercises.  Pt would benefit from aircast to use during tia chi and bilateral upright AFO for increasing safety with basic ambulation.  Pt would benefit from HHPT services to get him back to baseline.  pt has rolling walker he can use on discharge to increase his safety ( he was using cane prior to admit). PT Recommendation/Assessment: Patient will need skilled PT in the acute care venue PT Problem List: Decreased  strength;Decreased mobility;Decreased knowledge of use of DME;Decreased safety awareness Barriers to Discharge: None PT Therapy Diagnosis : Difficulty walking;Generalized weakness PT Plan PT Frequency: Min 3X/week PT Treatment/Interventions: Gait training;DME instruction;Functional mobility training;Therapeutic exercise;Patient/family education PT Recommendation Follow Up Recommendations: Home health PT Equipment Recommended:  (Pt would benefit from aircast and bilateral upright AFO.  ) PT Goals  Acute Rehab PT Goals PT Goal Formulation: With patient Time For Goal Achievement: 2 weeks Pt will go Supine/Side to Sit: Independently Pt will Transfer Sit to Stand/Stand to Sit: Independently Pt will Ambulate: >150 feet;with modified independence Pt will Perform Home Exercise Program: Independently (focus on ankle strengthening)  PT Evaluation Precautions/Restrictions  Precautions Precautions: Fall Precaution Comments: Pt reports he almost fell yesterday trying to get to urinal and fell 3 months ago during [place] Required Braces or Orthoses: No Restrictions Weight Bearing Restrictions: No Prior Functioning  Home Living Lives With: Alone Receives Help From: Friend(s) Type of Home: Apartment Home Layout: One level Additional Comments: llives in independent living village at [facility] [facility] Prior Function Level of Independence: Independent with basic ADLs;Independent with homemaking with ambulation Cognition Cognition Arousal/Alertness: Awake/alert Overall Cognitive Status: Appears within functional limits for tasks assessed Cognition - Other Comments: pt with occasional forgetfullness  Extremity Assessment RLE Assessment RLE Assessment: Exceptions to [facility] RLE Strength Right Knee Extension: 3+/5 Right Ankle Dorsiflexion: 3-/5 Right Ankle Plantar Flexion: 4/5 Right Ankle Eversion: 2+/5 LLE Assessment LLE Assessment: Within Functional Limits  Mobility (including  Balance) Bed  Mobility Bed Mobility: Yes Supine to Sit: 5: Supervision;With rails (with extra effort) Supine to Sit Details (indicate cue type and reason): Pt has limited shoulder ROM in left shoulder (pt left handed and OT ordered) which made it harder to use arms for sit to stand but pt abel to do it. Transfers Transfers: Yes Sit to Stand: 5: Supervision Stand to Sit: 5: Supervision Stand to Sit Details: cues to back all the way up to chair and with walker use Ambulation/Gait Ambulation/Gait: Yes Ambulation/Gait Assistance: 4: Min assist Ambulation/Gait Assistance Details (indicate cue type and reason): Pt tried walking with no assistive device but made me nervous.  Pt with right foot drop which he calls slew footed and he just ignores it but said it wants to roll some of the time Ambulation Distance (Feet): 400 Feet Assistive device: Rolling walker Gait Pattern: Step-to pattern;Decreased dorsiflexion - right;Right steppage;Trunk flexed (right leg ER)  Posture/Postural Control Posture/Postural Control: No significant limitations Balance Balance Assessed: No Exercise  Other Exercises Other Exercises: Pt did right ankle pumps, ankle circles changing direction and ankle alphabet all with emphasis on DF and eversion End of Session PT - End of Session Activity Tolerance: Patient tolerated treatment well Patient left: in chair General Behavior During Session: [facility] for tasks performed Cognition: [facility] for tasks performed  Judson Roch 10/05/2011, 10:54 AM

## 2011-10-05 NOTE — Progress Notes (Signed)
10-05-11 UR completed . Fajr Fife RN BSN  

## 2011-10-05 NOTE — Progress Notes (Signed)
Subjective: Still some cough.  Not feeling worse.  No sig. Sob now.  Objective: Vital signs in last 24 hours: Temp:  [97.6 F (36.4 C)-98.4 F (36.9 C)] 98.1 F (36.7 C) (11/12 0500) Pulse Rate:  [51-67] 51  (11/12 0500) Resp:  [18-20] 20  (11/12 0500) BP: (106-131)/(49-94) 120/64 mmHg (11/12 0500) SpO2:  [92 %-96 %] 95 % (11/12 0500) Weight:  [96.571 kg (212 lb 14.4 oz)-97.16 kg (214 lb 3.2 oz)] 212 lb 14.4 oz (96.571 kg) (11/12 0500) Weight change:  Last BM Date: 10/04/11  Intake/Output from previous day:   Intake/Output this shift:    General appearance: alert and cooperative Resp: clear to auscultation bilaterally Cardio: regular rate and rhythm, S1, S2 normal, no murmur, click, rub or gallop GI: soft, non-tender; bowel sounds normal; no masses,  no organomegaly Extremities: extremities normal, atraumatic, no cyanosis or edema Neurologic: Grossly normal   Lab Results:  Basename 10/04/11 1945 10/04/11 1305  WBC 9.1 9.9  HGB 11.9* 12.4*  HCT 34.6* 36.4*  PLT 151 141*   BMET  Basename 10/04/11 1945 10/04/11 1305  NA 134* 134*  K 4.6 4.4  CL 99 98  CO2 28 25  GLUCOSE 330* 377*  BUN 19 19  CREATININE 0.81 0.70  CALCIUM 9.6 9.7   CMET CMP     Component Value Date/Time   NA 134* 10/04/2011 1945   K 4.6 10/04/2011 1945   CL 99 10/04/2011 1945   CO2 28 10/04/2011 1945   GLUCOSE 330* 10/04/2011 1945   BUN 19 10/04/2011 1945   CREATININE 0.81 10/04/2011 1945   CALCIUM 9.6 10/04/2011 1945   PROT 6.3 10/04/2011 1945   ALBUMIN 3.1* 10/04/2011 1945   AST 21 10/04/2011 1945   ALT 20 10/04/2011 1945   ALKPHOS 81 10/04/2011 1945   BILITOT 0.3 10/04/2011 1945   GFRNONAA 81* 10/04/2011 1945   GFRAA >90 10/04/2011 1945     Studies/Results: Dg Chest 2 View  10/04/2011  *RADIOLOGY REPORT*  Clinical Data: Cough, congestion, shortness of breath  CHEST - 2 VIEW  Comparison: 12/01/2009  Findings: The lungs are essentially clear. No pleural effusion or  pneumothorax.  Cardiomediastinal silhouette is within normal limits.  Degenerative changes of the visualized thoracolumbar spine.  IMPRESSION: No evidence of acute cardiopulmonary disease.  Original Report Authenticated By: Charline Bills, M.D.   Ct Angio Chest W/cm &/or Wo Cm  10/04/2011  *RADIOLOGY REPORT*  Clinical Data:   77 year old male with chest pain, shortness of breath.  CT ANGIOGRAPHY CHEST WITH CONTRAST  Technique:  Multidetector CT imaging of the chest was performed using the standard protocol during bolus administration of intravenous contrast.  Multiplanar CT image reconstructions including MIPs were obtained to evaluate the vascular anatomy.  Contrast: OMNIPAQUE IOHEXOL 350 MG/ML IV SOLN  Comparison:     Chest radiographs from the same day and earlier.  Findings:  Good contrast bolus timing in the pulmonary arterial tree.  Mild respiratory motion at the lung bases. No focal filling defect identified in the pulmonary arterial tree to suggest the presence of acute pulmonary embolism.  Major airways are patent.  Patchy posterior basal lower lobe opacity bilaterally, greater on the right.  Superimposed mild dependent atelectasis.  Calcified granuloma along the right minor fissure.  Negative visualized thoracic inlet.  Mediastinal nodes measure up to 10 mm in the aorticopulmonary window.  Hilar lymph nodes also are mildly enlarged, more so on the right.  Calcified coronary artery atherosclerosis.  No pericardial or pleural  effusion. Negative visualized essentially noncontrast upper abdominal viscera.  Prominent vertebral body hemangioma in the T8 level.  Chronic right posterior lateral rib fractures. No acute osseous abnormality identified.  Review of the MIP images confirms the above findings.  IMPRESSION: 1. No evidence of acute pulmonary embolus. 2.  Right greater than left lower lobe opacity suspicious for acute pneumonia.  Hilar and AP window lymph nodal enlargement favored to be  reactive.  Original Report Authenticated By: Harley Hallmark, M.D.    Medications: I have reviewed the patient's current medications.  Assessment/Plan:  Principal Problem:  *Hypoxemia requiring supplemental oxygen  Continue oxygen. He does appear to have a bit of pneumonia based on his ct scan. Active Problems:  Type II or unspecified type diabetes mellitus with peripheral circulatory disorders, uncontrolled  Continue insulin  CAD (coronary artery disease) clinically stable  Essential hypertension, benign  follow  Other and unspecified hyperlipidemia follow, on medication.  Osteoporosis follow Disposition.  Plan IV antibiotics for a few days and then transition to oral. Try to wean off o2 tomorrow. He has gait instability . Ask for PT/ot eval and treat.   LOS: 1 day   Ezequiel Kayser, MD 10/05/2011, 7:23 AM

## 2011-10-06 LAB — COMPREHENSIVE METABOLIC PANEL
ALT: 16 U/L (ref 0–53)
AST: 19 U/L (ref 0–37)
Albumin: 3.1 g/dL — ABNORMAL LOW (ref 3.5–5.2)
Calcium: 9.3 mg/dL (ref 8.4–10.5)
GFR calc Af Amer: >90 mL/min (ref >90)
Sodium: 140 mEq/L (ref 135–145)
Total Protein: 6.5 g/dL (ref 6.0–8.3)

## 2011-10-06 LAB — CBC
HCT: 37.8 % — ABNORMAL LOW (ref 39.0–52.0)
Hemoglobin: 12.6 g/dL — ABNORMAL LOW (ref 13.0–17.0)
MCH: 33.4 pg (ref 26.0–34.0)
MCHC: 33.3 g/dL (ref 30.0–36.0)

## 2011-10-06 LAB — DIFFERENTIAL
Basophils Relative: 0 % (ref 0–1)
Monocytes Absolute: 0.6 10*3/uL (ref 0.1–1.0)
Monocytes Relative: 6 % (ref 3–12)
Neutro Abs: 4.3 10*3/uL (ref 1.7–7.7)

## 2011-10-06 LAB — GLUCOSE, CAPILLARY: Glucose-Capillary: 65 mg/dL — ABNORMAL LOW (ref 70–99)

## 2011-10-06 MED ORDER — POLYETHYLENE GLYCOL 3350 17 G PO PACK
17.0000 g | PACK | Freq: Every day | ORAL | Status: DC
  Filled 2011-10-06 (×2): qty 1

## 2011-10-06 NOTE — Plan of Care (Signed)
Problem: Phase III Progression Outcomes Goal: Activity at appropriate level-compared to baseline (UP IN CHAIR FOR HEMODIALYSIS)  Outcome: Completed/Met Date Met:  10/06/11 Pt up and moving with OT at I/Mod I level for BADLs.

## 2011-10-06 NOTE — Progress Notes (Signed)
Physical Therapy Treatment Patient Details Name: [name] MRN: [id number] DOB: [birth year] Today's Date: 10/06/2011  PT Assessment/Plan  PT - Assessment/Plan Comments on Treatment Session: Pt's primary fall risk is his rt. ankle instability.  Pt. wants to follow up with this with therapists at [facility]. PT Plan: Discharge plan remains appropriate PT Frequency: Min 3X/week Follow Up Recommendations: Home health PT Equipment Recommended: Other (comment) (AFO for rt foot) PT Goals  Acute Rehab PT Goals PT Goal: Supine/Side to Sit - Progress: Progressing toward goal PT Goal: Ambulate - Progress: Progressing toward goal PT Goal: Perform Home Exercise Program - Progress: Progressing toward goal  PT Treatment Precautions/Restrictions  Precautions Precautions: Fall Precaution Comments: Pt reports he almost fell yesterday trying to get to urinal and fell 3 months ago during [place] Required Braces or Orthoses: No Restrictions Weight Bearing Restrictions: No Mobility (including Balance) Bed Mobility Bed Mobility: Yes Supine to Sit: 6: Modified independent (Device/Increase time) (Incr time and use of rail) Transfers Transfers: Yes Sit to Stand: 5: Supervision;From bed;With upper extremity assist Stand to Sit: 6: Modified independent (Device/Increase time);To bed;With upper extremity assist Ambulation/Gait Ambulation/Gait: Yes Ambulation/Gait Assistance: 5: Supervision Ambulation/Gait Assistance Details (indicate cue type and reason): Cues to stay closer to rolling walker and to keep feet inside base of walker especially rt. foot. Assistive device: Rolling walker Gait Pattern: Step-to pattern;Decreased dorsiflexion - right;Decreased step length - right;Decreased step length - left;Trunk flexed  Balance Balance Assessed: Yes Static Standing Balance Static Standing - Level of Assistance: 5: Stand by assistance Exercise  Other Exercises Other Exercises: Did  standing toe raises and heel raises with support of chair x 10 reps Other Exercises: Did sitting ankle inversion/eversion on rt. x 10 reps. End of Session PT - End of Session Activity Tolerance: Patient tolerated treatment well Patient left: in bed General Behavior During Session: [facility] for tasks performed Cognition: [facility] for tasks performed  [facility] 10/06/2011, 12:50 PM [facility] PT [phone]

## 2011-10-06 NOTE — Progress Notes (Signed)
Subjective: Feels okay. No new complaints.  Off oxygen.  Objective: Vital signs in last 24 hours: Temp:  [97.6 F (36.4 C)-98 F (36.7 C)] 98 F (36.7 C) (11/13 0500) Pulse Rate:  [47-54] 53  (11/13 0500) Resp:  [18-19] 19  (11/13 0500) BP: (104-111)/(50-73) 111/73 mmHg (11/13 0500) SpO2:  [93 %-97 %] 97 % (11/13 0500) Weight change:  Last BM Date: 10/05/11  Intake/Output from previous day: 11/12 0701 - 11/13 0700 In: 960 [P.O.:960] Out: 2220 [Urine:2220] Intake/Output this shift:    General appearance: alert and cooperative Resp: clear to auscultation bilaterally Cardio: regular rate and rhythm, S1, S2 normal, no murmur, click, rub or gallop GI: soft, non-tender; bowel sounds normal; no masses,  no organomegaly Extremities: extremities normal, atraumatic, no cyanosis or edema Neurologic: Grossly normal   Lab Results:  Basename 10/06/11 0515 10/04/11 1945  WBC 9.7 9.1  HGB 12.6* 11.9*  HCT 37.8* 34.6*  PLT 175 151   BMET  Basename 10/06/11 0515 10/04/11 1945  NA 140 134*  K 3.8 4.6  CL 103 99  CO2 29 28  GLUCOSE 91 330*  BUN 17 19  CREATININE 0.80 0.81  CALCIUM 9.3 9.6   CMET CMP     Component Value Date/Time   NA 140 10/06/2011 0515   K 3.8 10/06/2011 0515   CL 103 10/06/2011 0515   CO2 29 10/06/2011 0515   GLUCOSE 91 10/06/2011 0515   BUN 17 10/06/2011 0515   CREATININE 0.80 10/06/2011 0515   CALCIUM 9.3 10/06/2011 0515   PROT 6.5 10/06/2011 0515   ALBUMIN 3.1* 10/06/2011 0515   AST 19 10/06/2011 0515   ALT 16 10/06/2011 0515   ALKPHOS 56 10/06/2011 0515   BILITOT 0.4 10/06/2011 0515   GFRNONAA 81* 10/06/2011 0515   GFRAA >90 10/06/2011 0515     Studies/Results: Dg Chest 2 View  10/05/2011  *RADIOLOGY REPORT*  Clinical Data: Shortness of breath.  Weakness.  I  CHEST - 2 VIEW  Comparison: 10/04/2011  Findings: Indistinct lower lobe linear opacities are present, left greater than right, and may reflect atelectasis or early pneumonia.   Right lower rib deformities are noted posterolaterally.  There is also arthropathy of the left glenohumeral joint and healed deformity of the left humeral shaft seen on the lateral view.  Cardiac and mediastinal contours appear unremarkable.  No pleural effusion is observed.  IMPRESSION:  1.  Linear opacities posteriorly in the lower lobes, left greater than right, possibly from pneumonia or atelectasis.  Original Report Authenticated By: Dellia Cloud, M.D.   Dg Chest 2 View  10/04/2011  *RADIOLOGY REPORT*  Clinical Data: Cough, congestion, shortness of breath  CHEST - 2 VIEW  Comparison: 12/01/2009  Findings: The lungs are essentially clear. No pleural effusion or pneumothorax.  Cardiomediastinal silhouette is within normal limits.  Degenerative changes of the visualized thoracolumbar spine.  IMPRESSION: No evidence of acute cardiopulmonary disease.  Original Report Authenticated By: Charline Bills, M.D.   Ct Angio Chest W/cm &/or Wo Cm  10/04/2011  *RADIOLOGY REPORT*  Clinical Data:   77 year old male with chest pain, shortness of breath.  CT ANGIOGRAPHY CHEST WITH CONTRAST  Technique:  Multidetector CT imaging of the chest was performed using the standard protocol during bolus administration of intravenous contrast.  Multiplanar CT image reconstructions including MIPs were obtained to evaluate the vascular anatomy.  Contrast: OMNIPAQUE IOHEXOL 350 MG/ML IV SOLN  Comparison:     Chest radiographs from the same day and earlier.  Findings:  Good contrast bolus timing in the pulmonary arterial tree.  Mild respiratory motion at the lung bases. No focal filling defect identified in the pulmonary arterial tree to suggest the presence of acute pulmonary embolism.  Major airways are patent.  Patchy posterior basal lower lobe opacity bilaterally, greater on the right.  Superimposed mild dependent atelectasis.  Calcified granuloma along the right minor fissure.  Negative visualized thoracic inlet.   Mediastinal nodes measure up to 10 mm in the aorticopulmonary window.  Hilar lymph nodes also are mildly enlarged, more so on the right.  Calcified coronary artery atherosclerosis.  No pericardial or pleural effusion. Negative visualized essentially noncontrast upper abdominal viscera.  Prominent vertebral body hemangioma in the T8 level.  Chronic right posterior lateral rib fractures. No acute osseous abnormality identified.  Review of the MIP images confirms the above findings.  IMPRESSION: 1. No evidence of acute pulmonary embolus. 2.  Right greater than left lower lobe opacity suspicious for acute pneumonia.  Hilar and AP window lymph nodal enlargement favored to be reactive.  Original Report Authenticated By: Harley Hallmark, M.D.    Medications: I have reviewed the patient's current medications.  Assessment/Plan:  Principal Problem:  *Pneumonia  He is off oxygen and vital signs stable. Give one more day of IV antibiotics and plan home tomorrow to complete 10 more days of augmentin.  Active Problems:  Hypoxemia requiring supplemental oxygen  better  Type II or unspecified type diabetes mellitus with peripheral circulatory disorders, uncontrolled  Continue regimen  CAD (coronary artery disease) stable  Essential hypertension, benign stable  Other and unspecified hyperlipidemia on therapy  Osteoporosis  Follow. On reclast infusions.  Gait instability pt/ot Constipation  Give miralax.   LOS: 2 days   Ezequiel Kayser, MD 10/06/2011, 7:42 AM

## 2011-10-06 NOTE — Progress Notes (Signed)
Occupational Therapy Evaluation Patient Details Name: [name] MRN: [id number] DOB: Oct 17, 1927 Today's Date: 10/06/2011 14:23-15:30  Problem List:  Patient Active Problem List  Diagnoses  . CORONARY ARTERY DISEASE, S/P PTCA  . RADIATION PROCTITIS  . Hypoxemia requiring supplemental oxygen  . Type II or unspecified type diabetes mellitus with peripheral circulatory disorders, uncontrolled  . CAD (coronary artery disease)  . Essential hypertension, benign  . Other and unspecified hyperlipidemia  . Osteoporosis  . Pneumonia  . Gait instability    Past Medical History:  Past Medical History  Diagnosis Date  . Coronary artery disease   . Diabetes mellitus   . Hyperlipidemia   . Prostate cancer    Past Surgical History:  Past Surgical History  Procedure Date  . Cardiac catheterization 01/24/2007    EF 41%  . Cardiac catheterization 05/02/1999  . US echocardiography 07/07/2005    EF 55-60%  . Cardiovascular stress test 01/14/2007    EF 41%  . Coronary angioplasty with stent placement     LEFT ANTERIOR DESCENDING ARTERY AND DIAGONAL ARTERY    OT Assessment/Plan/Recommendation OT Assessment Clinical Impression Statement: This 77 yo Wm admitted with cough and not feeling well, presents to acute OT at a I/Mod I level. No further OT needs, will sign off. OT Recommendation/Assessment: Patient does not need any further OT services OT Recommendation Equipment Recommended: Other (comment);Rolling walker with 5" wheels (R air cast for ankle.) OT Goals    OT Evaluation Precautions/Restrictions  Precautions Precautions: Fall Precaution Comments: Pt reports he almost fell yesterday trying to get to urinal and fell 3 months ago during tia chi at [place] Required Braces or Orthoses: No Restrictions Weight Bearing Restrictions: No Prior Functioning Home Living Lives With: Alone Receives Help From: Friend(s);Family Type of Home: Apartment Bathroom Shower/Tub: Walk-in  shower;Curtain;Other (comment) (Hand held shower, grab bars) Bathroom Toilet: Handicapped height Bathroom Accessibility: Yes How Accessible: Accessible via walker Home Adaptive Equipment: Built-in shower seat;Grab bars in shower;Grab bars around toilet Prior Function Level of Independence: Independent with basic ADLs;Independent with gait;Independent with transfers;Independent with homemaking with ambulation Driving: Yes Vocation: Retired ADL ADL Eating/Feeding: Performed;Independent Where Assessed - Eating/Feeding: Chair Grooming: Performed;Wash/dry hands;Independent Where Assessed - Grooming: Standing at sink Upper Body Bathing: Simulated;Independent Where Assessed - Upper Body Bathing: Sit to stand from chair;Unsupported Lower Body Bathing: Simulated;Modified independent Where Assessed - Lower Body Bathing: Sit to stand from chair Upper Body Dressing: Simulated;Independent Where Assessed - Upper Body Dressing: Sitting, chair Lower Body Dressing: Performed;Independent Where Assessed - Lower Body Dressing: Sit to stand from chair Toilet Transfer: Performed;Modified independent Toilet Transfer Method: Proofreader: Comfort height toilet;Grab bars Toileting - Clothing Manipulation: Performed;Independent Where Assessed - Toileting Clothing Manipulation: Standing Toileting - Hygiene: Performed;Independent Where Assessed - Toileting Hygiene: Sit to stand from 3-in-1 or toilet Tub/Shower Transfer: Not assessed Tub/Shower Transfer Method: Not assessed Equipment Used: Rolling walker ADL Comments: Spent a long time on helping the pt and his daughter try to figure out whether it would better for pt to have a standard RW or a rollator with a seat, with pt trying each on multiple times. Vision/Perception  Vision - History Baseline Vision: No visual deficits Cognition Cognition Arousal/Alertness: Awake/alert Overall Cognitive Status: Appears within functional  limits for tasks assessed Sensation/Coordination   Extremity Assessment RUE Assessment RUE Assessment: Within Functional Limits LUE Assessment LUE Assessment: Exceptions to [facility] LUE AROM (degrees) Left Shoulder Flexion  0-170: 50 Degrees LUE Strength Left Shoulder Flexion: 2-/5 Mobility  Bed  Mobility Bed Mobility: Yes Supine to Sit: 7: Independent Transfers Transfers: Yes Sit to Stand: 6: Modified independent (Device/Increase time) Stand to Sit: 6: Modified independent (Device/Increase time) Exercises Other Exercises Other Exercises: Did standing toe raises and heel raises with support of chair x 10 reps Other Exercises: Did sitting ankle inversion/eversion on rt. x 10 reps. End of Session OT - End of Session Equipment Utilized During Treatment: Other (comment) (RW) Activity Tolerance: Patient tolerated treatment well Patient left: in chair;with family/visitor present (daughter) General Behavior During Session: [facility] for tasks performed Cognition: [facility] for tasks performed   Evette Georges [phone] 10/06/2011, 4:20 PM

## 2011-10-07 LAB — CBC
Hemoglobin: 12.4 g/dL — ABNORMAL LOW (ref 13.0–17.0)
MCHC: 33.3 g/dL (ref 30.0–36.0)
WBC: 9.4 10*3/uL (ref 4.0–10.5)

## 2011-10-07 LAB — COMPREHENSIVE METABOLIC PANEL
ALT: 18 U/L (ref 0–53)
BUN: 19 mg/dL (ref 6–23)
CO2: 28 mEq/L (ref 19–32)
Calcium: 9.7 mg/dL (ref 8.4–10.5)
Creatinine, Ser: 0.82 mg/dL (ref 0.50–1.35)
GFR calc Af Amer: >90 mL/min (ref >90)
GFR calc non Af Amer: 80 mL/min — ABNORMAL LOW (ref >90)
Glucose, Bld: 176 mg/dL — ABNORMAL HIGH (ref 70–99)
Sodium: 138 mEq/L (ref 135–145)

## 2011-10-07 LAB — DIFFERENTIAL
Basophils Absolute: 0 10*3/uL (ref 0.0–0.1)
Basophils Relative: 0 % (ref 0–1)
Monocytes Relative: 6 % (ref 3–12)
Neutro Abs: 3.6 10*3/uL (ref 1.7–7.7)
Neutrophils Relative %: 38 % — ABNORMAL LOW (ref 43–77)

## 2011-10-07 LAB — GLUCOSE, CAPILLARY

## 2011-10-07 MED ORDER — POLYETHYLENE GLYCOL 3350 17 G PO PACK: 17.0000 g | PACK | Freq: Every day | ORAL | Status: AC

## 2011-10-07 MED ORDER — METFORMIN HCL 1000 MG PO TABS: 1000.0000 mg | ORAL_TABLET | Freq: Two times a day (BID) | ORAL | Status: DC

## 2011-10-07 MED ORDER — ACETAMINOPHEN 325 MG PO TABS: 650.0000 mg | ORAL_TABLET | Freq: Four times a day (QID) | ORAL | Status: AC | PRN

## 2011-10-07 MED ORDER — AMOXICILLIN-POT CLAVULANATE 875-125 MG PO TABS: 1.0000 | ORAL_TABLET | Freq: Two times a day (BID) | ORAL | Status: AC

## 2011-10-07 NOTE — Discharge Summary (Signed)
Physician Discharge Summary  Patient ID: [name] MRN: [id number] DOB/AGE: [birth year] 77 y.o.  Admit date: 10/04/2011 Discharge date: 10/07/2011  Admission Diagnoses: Hypoxia Pneumonia   Discharge Diagnoses:  Principal Problem:  *Pneumonia Active Problems:  Hypoxemia requiring supplemental oxygen  Type II or unspecified type diabetes mellitus with peripheral circulatory disorders, uncontrolled  CAD (coronary artery disease)  Essential hypertension, benign  Other and unspecified hyperlipidemia  Osteoporosis  Gait instability   Discharged Condition: good  Hospital Course:   [name] was admitted to a floor bed.  CT angiogram of the chest showed no pulmonary embolism.  It did show a right lower lobe pneumonia.  He was treated with ceftriaxone and azithromycin.  He improved over the course of 1-2 days and no longer required oxygen therapy.  PT and OT worked with the patient.   He remained stable from a cardiac and pulmonary standpoint.  PT felt that he could use a rolling walker with 5" wheels and an air brace for his right ankle as his right foot tended to deviate laterally when walking.  Prescriptions for these were given.  On 10/07/11 he was deemed stable for discharge back to his independent villa at [facility].  PT/OT at [facility] were suggested. He will take 10 further days of augmentin.   Consults: none  Significant Diagnostic Studies: labs:  CBC    Component Value Date/Time   WBC 9.4 10/07/2011 0505   RBC 3.70* 10/07/2011 0505   HGB 12.4* 10/07/2011 0505   HCT 37.2* 10/07/2011 0505   PLT 177 10/07/2011 0505   MCV 100.5* 10/07/2011 0505   MCH 33.5 10/07/2011 0505   MCHC 33.3 10/07/2011 0505   RDW 13.3 10/07/2011 0505   LYMPHSABS 4.7* 10/07/2011 0505   MONOABS 0.6 10/07/2011 0505   EOSABS 0.5 10/07/2011 0505   BASOSABS 0.0 10/07/2011 0505    BMET    Component Value Date/Time   NA 138 10/07/2011 0505   K 3.9 10/07/2011 0505   CL 101 10/07/2011 0505   CO2 28 10/07/2011 0505   GLUCOSE 176* 10/07/2011 0505   BUN 19 10/07/2011 0505   CREATININE 0.82 10/07/2011 0505   CALCIUM 9.7 10/07/2011 0505   GFRNONAA 80* 10/07/2011 0505   GFRAA >90 10/07/2011 0505    Treatments: antibiotics: ceftriaxone  Discharge Exam:  Blood pressure 122/52, pulse 50, temperature 98.6 F (37 C), temperature source Oral, resp. rate 16, height 6\' 2"  (1.88 m), weight 96.571 kg (212 lb 14.4 oz), SpO2 94.00%.  Physical Exam:BP 122/52  Pulse 50  Temp(Src) 98.6 F (37 C) (Oral)  Resp 16  Ht 6\' 2"  (1.88 m)  Wt 96.571 kg (212 lb 14.4 oz)  BMI 27.33 kg/m2  SpO2 94% He was in no acute distress. Lungs were CTA no W/R/R RRR no m/r/g, no C/C/E   Neurologically grossly intact.  Disposition: to home  Discharge Orders    Future Orders Please Complete By Expires   Diet - low sodium heart healthy      Increase activity slowly      Discharge instructions      Comments:   Call for office visit in 2 weeks.  [phone]     Discharge Medication List as of 10/07/2011 11:27 AM    START taking these medications   Details  acetaminophen (TYLENOL) 325 MG tablet Take 2 tablets (650 mg total) by mouth every 6 (six) hours as needed (or Fever >/= 101)., Starting 10/07/2011, Until Sat 10/17/11, Normal    amoxicillin-clavulanate (AUGMENTIN) 875-125  MG per tablet Take 1 tablet by mouth 2 (two) times daily., Starting 10/07/2011, Until Sat 10/17/11, Normal    !! metFORMIN (GLUCOPHAGE) 1000 MG tablet Take 1 tablet (1,000 mg total) by mouth 2 (two) times daily with a meal., Starting 10/07/2011, Until Thu 10/06/12, Normal    polyethylene glycol (MIRALAX / GLYCOLAX) packet Take 17 g by mouth daily., Starting 10/07/2011, Until Sat 10/10/11, Normal     !! - Potential duplicate medications found. Please discuss with provider.    CONTINUE these medications which have NOT CHANGED   Details  aspirin EC 81 MG tablet Take 81 mg by mouth daily.  , Until Discontinued, Historical Med    B-D  ULTRAFINE III SHORT PEN 31G X 8 MM MISC Inject as directed daily. , Starting 07/02/2011, Until Discontinued, Historical Med    calcium citrate-vitamin D (CITRACAL+D) 315-200 MG-UNIT per tablet Take 1 tablet by mouth daily.  , Until Discontinued, Historical Med    cholecalciferol (VITAMIN D) 1000 UNITS tablet Take 1,000 Units by mouth daily.  , Until Discontinued, Historical Med    CINNAMON PO Take 1,000 mg by mouth daily.  , Until Discontinued, Historical Med    clopidogrel (PLAVIX) 75 MG tablet Take 1 tablet (75 mg total) by mouth daily., Starting 03/23/2011, Until Tue 03/22/12, Normal    !! escitalopram (LEXAPRO) 10 MG tablet Take 10 mg by mouth daily.  , Until Discontinued, Historical Med    !! escitalopram (LEXAPRO) 5 MG tablet Take 5 mg by mouth daily.  , Until Discontinued, Historical Med    fish oil-omega-3 fatty acids 1000 MG capsule Take 1 g by mouth daily.  , Until Discontinued, Historical Med    insulin aspart (NOVOLOG) 100 UNIT/ML injection Inject 11 Units into the skin 3 (three) times daily. Use sliding scale , Until Discontinued, Historical Med    insulin glargine (LANTUS) 100 UNIT/ML injection Inject 35 Units into the skin 2 (two) times daily.  , Until Discontinued, Historical Med    !! isosorbide mononitrate (IMDUR) 60 MG 24 hr tablet TAKE ONE-HALF TABLET EVERY MORNING, Normal    !! isosorbide mononitrate (IMDUR) 60 MG 24 hr tablet Take 30 mg by mouth daily.  , Until Discontinued, Historical Med    !! metFORMIN (GLUCOPHAGE) 1000 MG tablet Take 1,000 mg by mouth 2 (two) times daily with a meal.  , Until Discontinued, Historical Med    Multiple Vitamin (MULTIVITAMIN) tablet Take 1 tablet by mouth daily.  , Until Discontinued, Historical Med    rosuvastatin (CRESTOR) 20 MG tablet Take 20 mg by mouth daily.  , Until Discontinued, Historical Med    Tamsulosin HCl (FLOMAX) 0.4 MG CAPS Take 0.4 mg by mouth daily.  , Until Discontinued, Historical Med    vitamin B-12  (CYANOCOBALAMIN) 500 MCG tablet Take 500 mcg by mouth daily.  , Until Discontinued, Historical Med     !! - Potential duplicate medications found. Please discuss with provider.       SignedRodrigo Ran A 10/07/2011, 8:08 PM

## 2011-10-07 NOTE — Progress Notes (Signed)
  Subjective: Feels okay. No bm yet.  Cough is improved. Only seldom coughs now.  Objective: Vital signs in last 24 hours: Temp:  [97.9 F (36.6 C)-98.6 F (37 C)] 98.6 F (37 C) (11/14 0515) Pulse Rate:  [50-65] 50  (11/14 0515) Resp:  [16-18] 16  (11/14 0515) BP: (103-124)/(52-71) 122/52 mmHg (11/14 0515) SpO2:  [93 %-94 %] 94 % (11/14 0515) Weight change:  Last BM Date: 10/04/11  Intake/Output from previous day: 11/13 0701 - 11/14 0700 In: 1080 [P.O.:1080] Out: 550 [Urine:550] Intake/Output this shift:    General appearance: alert and cooperative Resp: clear to auscultation bilaterally Cardio: regular rate and rhythm, S1, S2 normal, no murmur, click, rub or gallop GI: soft, non-tender; bowel sounds normal; no masses,  no organomegaly Extremities: extremities normal, atraumatic, no cyanosis or edema Neurologic: Grossly normal   Lab Results:  Basename 10/07/11 0505 10/06/11 0515  WBC 9.4 9.7  HGB 12.4* 12.6*  HCT 37.2* 37.8*  PLT 177 175   BMET  Basename 10/07/11 0505 10/06/11 0515  NA 138 140  K 3.9 3.8  CL 101 103  CO2 28 29  GLUCOSE 176* 91  BUN 19 17  CREATININE 0.82 0.80  CALCIUM 9.7 9.3   CMET CMP     Component Value Date/Time   NA 138 10/07/2011 0505   K 3.9 10/07/2011 0505   CL 101 10/07/2011 0505   CO2 28 10/07/2011 0505   GLUCOSE 176* 10/07/2011 0505   BUN 19 10/07/2011 0505   CREATININE 0.82 10/07/2011 0505   CALCIUM 9.7 10/07/2011 0505   PROT 6.3 10/07/2011 0505   ALBUMIN 3.1* 10/07/2011 0505   AST 21 10/07/2011 0505   ALT 18 10/07/2011 0505   ALKPHOS 52 10/07/2011 0505   BILITOT 0.2* 10/07/2011 0505   GFRNONAA 80* 10/07/2011 0505   GFRAA >90 10/07/2011 0505     Studies/Results: No results found.  Medications: I have reviewed the patient's current medications.  Assessment/Plan:  Principal Problem:  *Pneumonia- improving.  D/c to home today to complete 10 days of augmentin. Active Problems:  Hypoxemia requiring  supplemental oxygen better  Type II or unspecified type diabetes mellitus with peripheral circulatory disorders, uncontrolled-continue home regimen  CAD (coronary artery disease) stable.  Essential hypertension, benign stable.  Other and unspecified hyperlipidemia cont. Medicine.  Osteoporosis on treatment  Gait instability script for rolling walking and air brace for right ankle given.   LOS: 3 days   Ezequiel Kayser, MD 10/07/2011, 7:43 AM

## 2011-10-11 LAB — CULTURE, BLOOD (ROUTINE X 2)
Culture  Setup Time: 201211120032
Culture  Setup Time: 201211120032

## 2011-11-19 ENCOUNTER — Other Ambulatory Visit: Payer: Self-pay | Admitting: *Deleted

## 2011-11-19 MED ORDER — ISOSORBIDE MONONITRATE ER 60 MG PO TB24: 30.0000 mg | ORAL_TABLET | Freq: Every day | ORAL | Status: DC

## 2011-11-30 DIAGNOSIS — J189 Pneumonia, unspecified organism: Secondary | ICD-10-CM | POA: Diagnosis not present

## 2011-11-30 DIAGNOSIS — M6281 Muscle weakness (generalized): Secondary | ICD-10-CM | POA: Diagnosis not present

## 2011-11-30 DIAGNOSIS — Z9181 History of falling: Secondary | ICD-10-CM | POA: Diagnosis not present

## 2011-11-30 DIAGNOSIS — R269 Unspecified abnormalities of gait and mobility: Secondary | ICD-10-CM | POA: Diagnosis not present

## 2011-12-01 DIAGNOSIS — J189 Pneumonia, unspecified organism: Secondary | ICD-10-CM | POA: Diagnosis not present

## 2011-12-01 DIAGNOSIS — Z9181 History of falling: Secondary | ICD-10-CM | POA: Diagnosis not present

## 2011-12-01 DIAGNOSIS — R269 Unspecified abnormalities of gait and mobility: Secondary | ICD-10-CM | POA: Diagnosis not present

## 2011-12-01 DIAGNOSIS — M6281 Muscle weakness (generalized): Secondary | ICD-10-CM | POA: Diagnosis not present

## 2011-12-07 DIAGNOSIS — Z9181 History of falling: Secondary | ICD-10-CM | POA: Diagnosis not present

## 2011-12-07 DIAGNOSIS — M6281 Muscle weakness (generalized): Secondary | ICD-10-CM | POA: Diagnosis not present

## 2011-12-07 DIAGNOSIS — R269 Unspecified abnormalities of gait and mobility: Secondary | ICD-10-CM | POA: Diagnosis not present

## 2011-12-07 DIAGNOSIS — J189 Pneumonia, unspecified organism: Secondary | ICD-10-CM | POA: Diagnosis not present

## 2011-12-08 DIAGNOSIS — L84 Corns and callosities: Secondary | ICD-10-CM | POA: Diagnosis not present

## 2011-12-08 DIAGNOSIS — E119 Type 2 diabetes mellitus without complications: Secondary | ICD-10-CM | POA: Diagnosis not present

## 2011-12-08 DIAGNOSIS — L608 Other nail disorders: Secondary | ICD-10-CM | POA: Diagnosis not present

## 2011-12-09 DIAGNOSIS — Z9181 History of falling: Secondary | ICD-10-CM | POA: Diagnosis not present

## 2011-12-09 DIAGNOSIS — M6281 Muscle weakness (generalized): Secondary | ICD-10-CM | POA: Diagnosis not present

## 2011-12-09 DIAGNOSIS — R269 Unspecified abnormalities of gait and mobility: Secondary | ICD-10-CM | POA: Diagnosis not present

## 2011-12-09 DIAGNOSIS — J189 Pneumonia, unspecified organism: Secondary | ICD-10-CM | POA: Diagnosis not present

## 2011-12-11 DIAGNOSIS — M6281 Muscle weakness (generalized): Secondary | ICD-10-CM | POA: Diagnosis not present

## 2011-12-11 DIAGNOSIS — Z9181 History of falling: Secondary | ICD-10-CM | POA: Diagnosis not present

## 2011-12-11 DIAGNOSIS — J189 Pneumonia, unspecified organism: Secondary | ICD-10-CM | POA: Diagnosis not present

## 2011-12-11 DIAGNOSIS — R269 Unspecified abnormalities of gait and mobility: Secondary | ICD-10-CM | POA: Diagnosis not present

## 2011-12-14 DIAGNOSIS — R269 Unspecified abnormalities of gait and mobility: Secondary | ICD-10-CM | POA: Diagnosis not present

## 2011-12-14 DIAGNOSIS — M6281 Muscle weakness (generalized): Secondary | ICD-10-CM | POA: Diagnosis not present

## 2011-12-14 DIAGNOSIS — Z9181 History of falling: Secondary | ICD-10-CM | POA: Diagnosis not present

## 2011-12-14 DIAGNOSIS — J189 Pneumonia, unspecified organism: Secondary | ICD-10-CM | POA: Diagnosis not present

## 2011-12-15 DIAGNOSIS — J189 Pneumonia, unspecified organism: Secondary | ICD-10-CM | POA: Diagnosis not present

## 2011-12-15 DIAGNOSIS — R269 Unspecified abnormalities of gait and mobility: Secondary | ICD-10-CM | POA: Diagnosis not present

## 2011-12-15 DIAGNOSIS — M6281 Muscle weakness (generalized): Secondary | ICD-10-CM | POA: Diagnosis not present

## 2011-12-15 DIAGNOSIS — Z9181 History of falling: Secondary | ICD-10-CM | POA: Diagnosis not present

## 2011-12-18 DIAGNOSIS — J189 Pneumonia, unspecified organism: Secondary | ICD-10-CM | POA: Diagnosis not present

## 2011-12-18 DIAGNOSIS — R269 Unspecified abnormalities of gait and mobility: Secondary | ICD-10-CM | POA: Diagnosis not present

## 2011-12-18 DIAGNOSIS — M6281 Muscle weakness (generalized): Secondary | ICD-10-CM | POA: Diagnosis not present

## 2011-12-18 DIAGNOSIS — Z9181 History of falling: Secondary | ICD-10-CM | POA: Diagnosis not present

## 2011-12-21 DIAGNOSIS — J189 Pneumonia, unspecified organism: Secondary | ICD-10-CM | POA: Diagnosis not present

## 2011-12-21 DIAGNOSIS — Z9181 History of falling: Secondary | ICD-10-CM | POA: Diagnosis not present

## 2011-12-21 DIAGNOSIS — M6281 Muscle weakness (generalized): Secondary | ICD-10-CM | POA: Diagnosis not present

## 2011-12-21 DIAGNOSIS — R269 Unspecified abnormalities of gait and mobility: Secondary | ICD-10-CM | POA: Diagnosis not present

## 2011-12-23 DIAGNOSIS — Z9181 History of falling: Secondary | ICD-10-CM | POA: Diagnosis not present

## 2011-12-23 DIAGNOSIS — I10 Essential (primary) hypertension: Secondary | ICD-10-CM | POA: Diagnosis not present

## 2011-12-23 DIAGNOSIS — J189 Pneumonia, unspecified organism: Secondary | ICD-10-CM | POA: Diagnosis not present

## 2011-12-23 DIAGNOSIS — M6281 Muscle weakness (generalized): Secondary | ICD-10-CM | POA: Diagnosis not present

## 2011-12-23 DIAGNOSIS — R269 Unspecified abnormalities of gait and mobility: Secondary | ICD-10-CM | POA: Diagnosis not present

## 2011-12-23 DIAGNOSIS — I2510 Atherosclerotic heart disease of native coronary artery without angina pectoris: Secondary | ICD-10-CM | POA: Diagnosis not present

## 2011-12-23 DIAGNOSIS — E1159 Type 2 diabetes mellitus with other circulatory complications: Secondary | ICD-10-CM | POA: Diagnosis not present

## 2011-12-25 DIAGNOSIS — Z9181 History of falling: Secondary | ICD-10-CM | POA: Diagnosis not present

## 2011-12-25 DIAGNOSIS — M6281 Muscle weakness (generalized): Secondary | ICD-10-CM | POA: Diagnosis not present

## 2011-12-25 DIAGNOSIS — R269 Unspecified abnormalities of gait and mobility: Secondary | ICD-10-CM | POA: Diagnosis not present

## 2011-12-25 DIAGNOSIS — J189 Pneumonia, unspecified organism: Secondary | ICD-10-CM | POA: Diagnosis not present

## 2011-12-28 DIAGNOSIS — J189 Pneumonia, unspecified organism: Secondary | ICD-10-CM | POA: Diagnosis not present

## 2011-12-28 DIAGNOSIS — Z9181 History of falling: Secondary | ICD-10-CM | POA: Diagnosis not present

## 2011-12-28 DIAGNOSIS — R269 Unspecified abnormalities of gait and mobility: Secondary | ICD-10-CM | POA: Diagnosis not present

## 2011-12-28 DIAGNOSIS — M6281 Muscle weakness (generalized): Secondary | ICD-10-CM | POA: Diagnosis not present

## 2011-12-30 DIAGNOSIS — Z9181 History of falling: Secondary | ICD-10-CM | POA: Diagnosis not present

## 2011-12-30 DIAGNOSIS — J189 Pneumonia, unspecified organism: Secondary | ICD-10-CM | POA: Diagnosis not present

## 2011-12-30 DIAGNOSIS — R269 Unspecified abnormalities of gait and mobility: Secondary | ICD-10-CM | POA: Diagnosis not present

## 2011-12-30 DIAGNOSIS — M6281 Muscle weakness (generalized): Secondary | ICD-10-CM | POA: Diagnosis not present

## 2011-12-31 DIAGNOSIS — H43819 Vitreous degeneration, unspecified eye: Secondary | ICD-10-CM | POA: Diagnosis not present

## 2011-12-31 DIAGNOSIS — E119 Type 2 diabetes mellitus without complications: Secondary | ICD-10-CM | POA: Diagnosis not present

## 2011-12-31 DIAGNOSIS — H33029 Retinal detachment with multiple breaks, unspecified eye: Secondary | ICD-10-CM | POA: Diagnosis not present

## 2011-12-31 DIAGNOSIS — Z961 Presence of intraocular lens: Secondary | ICD-10-CM | POA: Diagnosis not present

## 2012-01-01 DIAGNOSIS — Z9181 History of falling: Secondary | ICD-10-CM | POA: Diagnosis not present

## 2012-01-01 DIAGNOSIS — R269 Unspecified abnormalities of gait and mobility: Secondary | ICD-10-CM | POA: Diagnosis not present

## 2012-01-01 DIAGNOSIS — J189 Pneumonia, unspecified organism: Secondary | ICD-10-CM | POA: Diagnosis not present

## 2012-01-01 DIAGNOSIS — M6281 Muscle weakness (generalized): Secondary | ICD-10-CM | POA: Diagnosis not present

## 2012-01-04 DIAGNOSIS — J189 Pneumonia, unspecified organism: Secondary | ICD-10-CM | POA: Diagnosis not present

## 2012-01-04 DIAGNOSIS — R269 Unspecified abnormalities of gait and mobility: Secondary | ICD-10-CM | POA: Diagnosis not present

## 2012-01-04 DIAGNOSIS — M6281 Muscle weakness (generalized): Secondary | ICD-10-CM | POA: Diagnosis not present

## 2012-01-04 DIAGNOSIS — Z9181 History of falling: Secondary | ICD-10-CM | POA: Diagnosis not present

## 2012-01-06 DIAGNOSIS — J189 Pneumonia, unspecified organism: Secondary | ICD-10-CM | POA: Diagnosis not present

## 2012-01-06 DIAGNOSIS — Z9181 History of falling: Secondary | ICD-10-CM | POA: Diagnosis not present

## 2012-01-06 DIAGNOSIS — M6281 Muscle weakness (generalized): Secondary | ICD-10-CM | POA: Diagnosis not present

## 2012-01-06 DIAGNOSIS — R269 Unspecified abnormalities of gait and mobility: Secondary | ICD-10-CM | POA: Diagnosis not present

## 2012-01-11 DIAGNOSIS — R269 Unspecified abnormalities of gait and mobility: Secondary | ICD-10-CM | POA: Diagnosis not present

## 2012-01-11 DIAGNOSIS — Z9181 History of falling: Secondary | ICD-10-CM | POA: Diagnosis not present

## 2012-01-11 DIAGNOSIS — J189 Pneumonia, unspecified organism: Secondary | ICD-10-CM | POA: Diagnosis not present

## 2012-01-11 DIAGNOSIS — M6281 Muscle weakness (generalized): Secondary | ICD-10-CM | POA: Diagnosis not present

## 2012-01-14 DIAGNOSIS — M6281 Muscle weakness (generalized): Secondary | ICD-10-CM | POA: Diagnosis not present

## 2012-01-14 DIAGNOSIS — J189 Pneumonia, unspecified organism: Secondary | ICD-10-CM | POA: Diagnosis not present

## 2012-01-14 DIAGNOSIS — R269 Unspecified abnormalities of gait and mobility: Secondary | ICD-10-CM | POA: Diagnosis not present

## 2012-01-14 DIAGNOSIS — Z9181 History of falling: Secondary | ICD-10-CM | POA: Diagnosis not present

## 2012-01-18 DIAGNOSIS — Z9181 History of falling: Secondary | ICD-10-CM | POA: Diagnosis not present

## 2012-01-18 DIAGNOSIS — R269 Unspecified abnormalities of gait and mobility: Secondary | ICD-10-CM | POA: Diagnosis not present

## 2012-01-18 DIAGNOSIS — M6281 Muscle weakness (generalized): Secondary | ICD-10-CM | POA: Diagnosis not present

## 2012-01-18 DIAGNOSIS — J189 Pneumonia, unspecified organism: Secondary | ICD-10-CM | POA: Diagnosis not present

## 2012-01-30 ENCOUNTER — Emergency Department (HOSPITAL_BASED_OUTPATIENT_CLINIC_OR_DEPARTMENT_OTHER)
Admission: EM | Admit: 2012-01-30 | Discharge: 2012-01-30 | Disposition: A | Discharge status: Home / Self Care | Payer: Medicare Other | Attending: Emergency Medicine | Admitting: Emergency Medicine

## 2012-01-30 ENCOUNTER — Encounter (HOSPITAL_BASED_OUTPATIENT_CLINIC_OR_DEPARTMENT_OTHER): Payer: Self-pay | Admitting: Emergency Medicine

## 2012-01-30 ENCOUNTER — Emergency Department (INDEPENDENT_AMBULATORY_CARE_PROVIDER_SITE_OTHER): Payer: Medicare Other

## 2012-01-30 ENCOUNTER — Other Ambulatory Visit: Payer: Self-pay

## 2012-01-30 DIAGNOSIS — R935 Abnormal findings on diagnostic imaging of other abdominal regions, including retroperitoneum: Secondary | ICD-10-CM

## 2012-01-30 DIAGNOSIS — R918 Other nonspecific abnormal finding of lung field: Secondary | ICD-10-CM | POA: Diagnosis not present

## 2012-01-30 DIAGNOSIS — J40 Bronchitis, not specified as acute or chronic: Secondary | ICD-10-CM | POA: Insufficient documentation

## 2012-01-30 DIAGNOSIS — Z79899 Other long term (current) drug therapy: Secondary | ICD-10-CM | POA: Insufficient documentation

## 2012-01-30 DIAGNOSIS — J209 Acute bronchitis, unspecified: Secondary | ICD-10-CM | POA: Diagnosis not present

## 2012-01-30 DIAGNOSIS — R059 Cough, unspecified: Secondary | ICD-10-CM

## 2012-01-30 DIAGNOSIS — R031 Nonspecific low blood-pressure reading: Secondary | ICD-10-CM | POA: Diagnosis not present

## 2012-01-30 DIAGNOSIS — R05 Cough: Secondary | ICD-10-CM | POA: Diagnosis not present

## 2012-01-30 LAB — BASIC METABOLIC PANEL WITH GFR
BUN: 21 mg/dL (ref 6–23)
CO2: 27 meq/L (ref 19–32)
Calcium: 9.6 mg/dL (ref 8.4–10.5)
Chloride: 96 meq/L (ref 96–112)
Creatinine, Ser: 0.8 mg/dL (ref 0.50–1.35)
GFR calc Af Amer: >90 mL/min
GFR calc non Af Amer: 81 mL/min — ABNORMAL LOW
Glucose, Bld: 347 mg/dL — ABNORMAL HIGH (ref 70–99)
Potassium: 4.6 meq/L (ref 3.5–5.1)
Sodium: 134 meq/L — ABNORMAL LOW (ref 135–145)

## 2012-01-30 LAB — CBC
HCT: 37.5 % — ABNORMAL LOW (ref 39.0–52.0)
Hemoglobin: 12.8 g/dL — ABNORMAL LOW (ref 13.0–17.0)
MCH: 34 pg (ref 26.0–34.0)
MCHC: 34.1 g/dL (ref 30.0–36.0)
MCV: 99.5 fL (ref 78.0–100.0)
Platelets: 121 K/uL — ABNORMAL LOW (ref 150–400)
RBC: 3.77 MIL/uL — ABNORMAL LOW (ref 4.22–5.81)
RDW: 13.1 % (ref 11.5–15.5)
WBC: 9.2 K/uL (ref 4.0–10.5)

## 2012-01-30 LAB — DIFFERENTIAL
Basophils Absolute: 0 K/uL (ref 0.0–0.1)
Basophils Relative: 0 % (ref 0–1)
Eosinophils Absolute: 0.2 K/uL (ref 0.0–0.7)
Eosinophils Relative: 2 % (ref 0–5)
Lymphocytes Relative: 38 % (ref 12–46)
Lymphs Abs: 3.5 K/uL (ref 0.7–4.0)
Monocytes Absolute: 0.8 K/uL (ref 0.1–1.0)
Monocytes Relative: 8 % (ref 3–12)
Neutro Abs: 4.7 K/uL (ref 1.7–7.7)
Neutrophils Relative %: 51 % (ref 43–77)

## 2012-01-30 LAB — CARDIAC PANEL(CRET KIN+CKTOT+MB+TROPI): Total CK: 204 U/L (ref 7–232)

## 2012-01-30 LAB — D-DIMER, QUANTITATIVE: D-Dimer, Quant: 0.41 ug{FEU}/mL (ref 0.00–0.48)

## 2012-01-30 MED ORDER — ALBUTEROL SULFATE HFA 108 (90 BASE) MCG/ACT IN AERS
2.0000 | INHALATION_SPRAY | Freq: Once | RESPIRATORY_TRACT | Status: AC
  Administered 2012-01-30: 2 via RESPIRATORY_TRACT
  Filled 2012-01-30: qty 6.7

## 2012-01-30 MED ORDER — DOXYCYCLINE HYCLATE 100 MG PO CAPS: 100.0000 mg | ORAL_CAPSULE | Freq: Two times a day (BID) | ORAL | Status: AC

## 2012-01-30 NOTE — ED Notes (Signed)
Pt also said he has been coughing up clear mucus and has a running nose

## 2012-01-30 NOTE — ED Notes (Cosign Needed)
Pt stated "cold started a few days ago, cough started yesterday, couldn't sleep last night"

## 2012-01-30 NOTE — Discharge Instructions (Signed)

## 2012-01-30 NOTE — ED Notes (Signed)
Pt SPO2 on RA prior to walking was 94% HE 87.  After walking a block around here in the ED HR was 78 and SPO2 was 92%.  Pt states I feel ok.

## 2012-01-30 NOTE — ED Provider Notes (Signed)
History     CSN: [id number]  Arrival date & time 01/30/12  1012   First MD Initiated Contact with Patient 01/30/12 1038      Chief Complaint  Patient presents with  . Cough    (Consider location/radiation/quality/duration/timing/severity/associated sxs/prior treatment) HPI Comments: Patient presents with cough, cold, congestion has been going on for about 6 days but worse in the past one day. Coughing of clear mucus he couldn't sleep last night because of coughing. He denies any shortness of breath or any chest pain. Isn't probably flat. He denies any nausea, vomiting, fever or chills. No abdominal pain. He has a history of diabetes and coronary disease with stent remote prostate cancer. He nurse's rhinorrhea, chills, throat irritation.  The history is provided by the patient.    Past Medical History  Diagnosis Date  . Coronary artery disease   . Diabetes mellitus   . Hyperlipidemia   . Prostate cancer     Past Surgical History  Procedure Date  . Cardiac catheterization 01/24/2007    EF 41%  . Cardiac catheterization 05/02/1999  . US echocardiography 07/07/2005    EF 55-60%  . Cardiovascular stress test 01/14/2007    EF 41%  . Coronary angioplasty with stent placement     LEFT ANTERIOR DESCENDING ARTERY AND DIAGONAL ARTERY    Family History  Problem Relation Age of Onset  . Coronary artery disease Father   . Heart failure Father     History  Substance Use Topics  . Smoking status: Never Smoker   . Smokeless tobacco: Not on file  . Alcohol Use: Yes     occassionally      Review of Systems  Constitutional: Negative for fever and activity change.  HENT: Positive for congestion, sore throat and rhinorrhea.   Respiratory: Positive for cough. Negative for chest tightness and shortness of breath.   Cardiovascular: Negative for chest pain.  Gastrointestinal: Negative for nausea, vomiting and abdominal pain.  Genitourinary: Negative for dysuria and hematuria.    Musculoskeletal: Negative for back pain.  Neurological: Negative for headaches.    Allergies  Actos and Sulfonamide derivatives  Home Medications   Current Outpatient Rx  Name Route Sig Dispense Refill  . ASPIRIN EC 81 MG PO TBEC Oral Take 81 mg by mouth daily.      . BD PEN NEEDLE SHORT U/F 31G X 8 MM MISC Injection Inject as directed daily.     Marland Kitchen CALCIUM CITRATE-VITAMIN D 315-200 MG-UNIT PO TABS Oral Take 1 tablet by mouth daily.      Marland Kitchen VITAMIN D 1000 UNITS PO TABS Oral Take 1,000 Units by mouth daily.      Marland Kitchen CINNAMON PO Oral Take 1,000 mg by mouth daily.      Marland Kitchen CLOPIDOGREL BISULFATE 75 MG PO TABS Oral Take 1 tablet (75 mg total) by mouth daily. 90 tablet 3  . DOXYCYCLINE HYCLATE 100 MG PO CAPS Oral Take 1 capsule (100 mg total) by mouth 2 (two) times daily. 20 capsule 0  . ESCITALOPRAM OXALATE 10 MG PO TABS Oral Take 10 mg by mouth daily.      Marland Kitchen ESCITALOPRAM OXALATE 5 MG PO TABS Oral Take 5 mg by mouth daily.      . OMEGA-3 FATTY ACIDS 1000 MG PO CAPS Oral Take 1 g by mouth daily.      . INSULIN ASPART 100 UNIT/ML [place] SOLN Subcutaneous Inject 11 Units into the skin 3 (three) times daily. Use sliding scale     .  INSULIN GLARGINE 100 UNIT/ML [place] SOLN Subcutaneous Inject 35 Units into the skin 2 (two) times daily.      . ISOSORBIDE MONONITRATE ER 60 MG PO TB24  TAKE ONE-HALF TABLET EVERY MORNING 15 tablet 5  . ISOSORBIDE MONONITRATE ER 60 MG PO TB24 Oral Take 0.5 tablets (30 mg total) by mouth daily. 15 tablet 5  . METFORMIN HCL 1000 MG PO TABS Oral Take 1,000 mg by mouth 2 (two) times daily with a meal.      . METFORMIN HCL 1000 MG PO TABS Oral Take 1 tablet (1,000 mg total) by mouth 2 (two) times daily with a meal. 60 tablet 11  . ONE-DAILY MULTI VITAMINS PO TABS Oral Take 1 tablet by mouth daily.      Marland Kitchen ROSUVASTATIN CALCIUM 20 MG PO TABS Oral Take 20 mg by mouth daily.      Marland Kitchen TAMSULOSIN HCL 0.4 MG PO CAPS Oral Take 0.4 mg by mouth daily.      Marland Kitchen VITAMIN B-12 500 MCG PO TABS Oral Take  500 mcg by mouth daily.        BP 108/63  Pulse 69  Temp(Src) 97.7 F (36.5 C) (Oral)  Resp 16  SpO2 95%  Physical Exam  Constitutional: He is oriented to person, place, and time. He appears well-developed and well-nourished. No distress.  HENT:  Head: Normocephalic and atraumatic.  Mouth/Throat: No oropharyngeal exudate.  Eyes: Conjunctivae and EOM are normal. Pupils are equal, round, and reactive to light.  Neck: Normal range of motion. Neck supple.  Cardiovascular: Normal rate, regular rhythm and normal heart sounds.   Pulmonary/Chest: Effort normal and breath sounds normal. No respiratory distress. He has no wheezes.       Clear bilaterally  Abdominal: Soft. There is no tenderness. There is no rebound and no guarding.  Musculoskeletal: Normal range of motion. He exhibits no edema and no tenderness.       No edema  Neurological: He is alert and oriented to person, place, and time.  Skin: Skin is warm.    ED Course  Procedures (including critical care time)  Labs Reviewed  CBC - Abnormal; Notable for the following:    RBC 3.77 (*)    Hemoglobin 12.8 (*)    HCT 37.5 (*)    Platelets 121 (*)    All other components within normal limits  BASIC METABOLIC PANEL - Abnormal; Notable for the following:    Sodium 134 (*)    Glucose, Bld 347 (*)    GFR calc non Af Amer 81 (*)    All other components within normal limits  CARDIAC PANEL(CRET KIN+CKTOT+MB+TROPI) - Abnormal; Notable for the following:    CK, MB 5.5 (*)    Relative Index 2.7 (*)    All other components within normal limits  DIFFERENTIAL  D-DIMER, QUANTITATIVE   Dg Chest 2 View  01/30/2012  *RADIOLOGY REPORT*  Clinical Data: Cough for the past several days.  CHEST - 2 VIEW  Comparison: Chest x-ray 10/05/2011.  Findings: Lung volumes are normal.  No focal airspace consolidation.  Linear opacities in the lung bases bilaterally are favored to represent areas of subsegmental atelectasis and/or scarring (scarring was  noted in the lung bases on prior CT of the thorax 10/04/2011).  No pleural effusions.  Pulmonary vasculature is normal.  Heart size is normal.  Mediastinal contours are unremarkable.  Atherosclerosis in the thoracic aorta.  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  This results in a lucent line underneath the right hemidiaphragm that is favored to simply reflect some underlying bowel gas (rather than pneumoperitoneum).  IMPRESSION: 1.  No definite radiographic evidence of acute cardiopulmonary disease.  Bibasilar subsegmental atelectasis and/or scarring. 2. Unusual lucencies underneath the right hemidiaphragm are favored to be related to a colonic interposition (a normal and benign variant).  However, if the patient has abdominal pain or there is clinical concern for perforated bowel, dedicated abdominal radiographs could be obtained (preferably with a left lateral decubitus view).  Original Report Authenticated By: Florencia Reasons, M.D.     1. Bronchitis       MDM  Rhinorrhea, cough, congestion x 6 days.  No CP or SOB.  Vitals stable, lungs clear.  O2 saturadtion improved to 97% spontaneously.  No PNA, ddimer neg.  Patient ambulated with no desaturation and his oxygen level. He's had no chest pain, shortness of breath. We'll treat for bronchitis with doxycycline and albuterol inhaler follow up with Dr. Sherrye Payor this week     Date: 01/30/2012  Rate: 64  Rhythm: sinus arrhythmia  QRS Axis: left  Intervals: normal  ST/T Wave abnormalities: normal  Conduction Disutrbances:right bundle branch block  Narrative Interpretation:   Old EKG Reviewed: unchanged    Glynn Octave, MD 01/30/12 1740

## 2012-02-02 DIAGNOSIS — M810 Age-related osteoporosis without current pathological fracture: Secondary | ICD-10-CM | POA: Diagnosis not present

## 2012-02-02 DIAGNOSIS — J40 Bronchitis, not specified as acute or chronic: Secondary | ICD-10-CM | POA: Diagnosis not present

## 2012-02-02 DIAGNOSIS — I2510 Atherosclerotic heart disease of native coronary artery without angina pectoris: Secondary | ICD-10-CM | POA: Diagnosis not present

## 2012-02-04 DIAGNOSIS — C61 Malignant neoplasm of prostate: Secondary | ICD-10-CM | POA: Diagnosis not present

## 2012-02-08 DIAGNOSIS — J40 Bronchitis, not specified as acute or chronic: Secondary | ICD-10-CM | POA: Diagnosis not present

## 2012-02-08 DIAGNOSIS — E1159 Type 2 diabetes mellitus with other circulatory complications: Secondary | ICD-10-CM | POA: Diagnosis not present

## 2012-02-08 DIAGNOSIS — C61 Malignant neoplasm of prostate: Secondary | ICD-10-CM | POA: Diagnosis not present

## 2012-02-08 DIAGNOSIS — R809 Proteinuria, unspecified: Secondary | ICD-10-CM | POA: Diagnosis not present

## 2012-02-11 DIAGNOSIS — E291 Testicular hypofunction: Secondary | ICD-10-CM | POA: Diagnosis not present

## 2012-02-11 DIAGNOSIS — C61 Malignant neoplasm of prostate: Secondary | ICD-10-CM | POA: Diagnosis not present

## 2012-02-12 DIAGNOSIS — L608 Other nail disorders: Secondary | ICD-10-CM | POA: Diagnosis not present

## 2012-02-12 DIAGNOSIS — L84 Corns and callosities: Secondary | ICD-10-CM | POA: Diagnosis not present

## 2012-02-12 DIAGNOSIS — E119 Type 2 diabetes mellitus without complications: Secondary | ICD-10-CM | POA: Diagnosis not present

## 2012-02-16 ENCOUNTER — Encounter (HOSPITAL_COMMUNITY): Payer: Self-pay

## 2012-02-16 ENCOUNTER — Encounter (HOSPITAL_COMMUNITY)
Admission: RE | Admit: 2012-02-16 | Discharge: 2012-02-16 | Disposition: A | Discharge status: Home / Self Care | Payer: Medicare Other | Source: Ambulatory Visit | Attending: Internal Medicine | Admitting: Internal Medicine

## 2012-02-16 DIAGNOSIS — M810 Age-related osteoporosis without current pathological fracture: Secondary | ICD-10-CM | POA: Diagnosis not present

## 2012-02-16 MED ORDER — SODIUM CHLORIDE 0.9 % IV SOLN
INTRAVENOUS | Status: AC
  Administered 2012-02-16: 13:00:00 via INTRAVENOUS

## 2012-02-16 MED ORDER — ZOLEDRONIC ACID 5 MG/100ML IV SOLN
5.0000 mg | Freq: Once | INTRAVENOUS | Status: AC
  Administered 2012-02-16: 5 mg via INTRAVENOUS
  Filled 2012-02-16: qty 100

## 2012-02-16 NOTE — Discharge Instructions (Signed)
Zoledronic Acid injection (Paget's Disease, Osteoporosis) What is this medicine? ZOLEDRONIC ACID (ZOE le dron ik AS id) lowers the amount of calcium loss from bone. It is used to treat Paget's disease and osteoporosis in women. This medicine may be used for other purposes; ask your health care provider or pharmacist if you have questions. What should I tell my health care provider before I take this medicine? They need to know if you have any of these conditions: -aspirin-sensitive asthma -dental disease -kidney disease -low levels of calcium in the blood -past surgery on the parathyroid gland or intestines -an unusual or allergic reaction to zoledronic acid, other medicines, foods, dyes, or preservatives -pregnant or trying to get pregnant -breast-feeding How should I use this medicine? This medicine is for infusion into a vein. It is given by a health care professional in a hospital or clinic setting. Talk to your pediatrician regarding the use of this medicine in children. This medicine is not approved for use in children. Overdosage: If you think you have taken too much of this medicine contact a poison control center or emergency room at once. NOTE: This medicine is only for you. Do not share this medicine with others. What if I miss a dose? It is important not to miss your dose. Call your doctor or health care professional if you are unable to keep an appointment. What may interact with this medicine? -certain antibiotics given by injection -NSAIDs, medicines for pain and inflammation, like ibuprofen or naproxen -some diuretics like bumetanide, furosemide -teriparatide This list may not describe all possible interactions. Give your health care provider a list of all the medicines, herbs, non-prescription drugs, or dietary supplements you use. Also tell them if you smoke, drink alcohol, or use illegal drugs. Some items may interact with your medicine. What should I watch for while  using this medicine? Visit your doctor or health care professional for regular checkups. It may be some time before you see the benefit from this medicine. Do not stop taking your medicine unless your doctor tells you to. Your doctor may order blood tests or other tests to see how you are doing. Women should inform their doctor if they wish to become pregnant or think they might be pregnant. There is a potential for serious side effects to an unborn child. Talk to your health care professional or pharmacist for more information. You should make sure that you get enough calcium and vitamin D while you are taking this medicine. Discuss the foods you eat and the vitamins you take with your health care professional. Some people who take this medicine have severe bone, joint, and/or muscle pain. This medicine may also increase your risk for a broken thigh bone. Tell your doctor right away if you have pain in your upper leg or groin. Tell your doctor if you have any pain that does not go away or that gets worse. What side effects may I notice from receiving this medicine? Side effects that you should report to your doctor or health care professional as soon as possible: -allergic reactions like skin rash, itching or hives, swelling of the face, lips, or tongue -breathing problems -changes in vision -feeling faint or lightheaded, falls -jaw burning, cramping, or pain -muscle cramps, stiffness, or weakness -trouble passing urine or change in the amount of urine Side effects that usually do not require medical attention (report to your doctor or health care professional if they continue or are bothersome): -bone, joint, or muscle pain -fever -  irritation at site where injected -loss of appetite -nausea, vomiting -stomach upset -tired This list may not describe all possible side effects. Call your doctor for medical advice about side effects. You may report side effects to FDA at 1-800-FDA-1088. Where  should I keep my medicine? This drug is given in a hospital or clinic and will not be stored at home. NOTE: This sheet is a summary. It may not cover all possible information. If you have questions about this medicine, talk to your doctor, pharmacist, or health care provider.  2012, [employer]. (05/08/2011 9:08:15 AM) 

## 2012-02-17 ENCOUNTER — Encounter: Payer: Self-pay | Admitting: Cardiovascular Disease

## 2012-02-17 ENCOUNTER — Ambulatory Visit (INDEPENDENT_AMBULATORY_CARE_PROVIDER_SITE_OTHER): Payer: Medicare Other | Admitting: Cardiovascular Disease

## 2012-02-17 VITALS — BP 141/72 | HR 62 | Ht 74.0 in | Wt 219.0 lb

## 2012-02-17 DIAGNOSIS — I2510 Atherosclerotic heart disease of native coronary artery without angina pectoris: Secondary | ICD-10-CM | POA: Diagnosis not present

## 2012-02-17 NOTE — Patient Instructions (Signed)
Your physician wants you to follow-up in: 6 MONTHS You will receive a reminder letter in the mail two months in advance. If you don't receive a letter, please call our office to schedule the follow-up appointment.  Your physician recommends that you return for a FASTING lipid profile: 6 MONTHS  Your physician recommends that you continue on your current medications as directed. Please refer to the Current Medication list given to you today.     

## 2012-02-17 NOTE — Assessment & Plan Note (Signed)
But she's doing very well. He's not had any episodes of chest pain or shortness breath. He has a stenosis in his left circumflex marginal branch which we are treating medically. Fortunately he's not had any problems with that.  We'll continue the same medications. I'll see him again in 6 months.

## 2012-02-17 NOTE — Progress Notes (Signed)
[name] Date of Birth  March 01, 1927 [facility] [address]     [address] [place], [place]  [address] [phone]  Fax  [phone]  Problem list: Coronary artery disease-status post stenting of his LAD and diagonal artery 2. Small stenosis in his circumflex marginal artery that has been treated medically 3. Prostate cancer-status post radiation therapy - [facility]. 4. Diabetes mellitus 5. Hyperlipidemia 6. Status post significant spiral fracture of his left arm. This was managed conservatively because of the severity of the fracture.  History of Present Illness:  78 yo gentleman with a history of coronary artery disease. Status post PTCA and stenting of his left ear descending artery and diagonal artery. He has a small stenosis in the left circumflex flexor event has been treated medically.  Has a history of prostate cancer and has been treated with radiation.  His last PSA was 0. He has a history of hyperlipidemia and diabetes mellitus.  He denies any chest pain or shortness of breath.  Current Outpatient Prescriptions on File Prior to Visit  Medication Sig Dispense Refill  . aspirin EC 81 MG tablet Take 81 mg by mouth daily.        . B-D ULTRAFINE III SHORT PEN 31G X 8 MM MISC Inject as directed daily.       . calcium citrate-vitamin D (CITRACAL+D) 315-200 MG-UNIT per tablet Take 1 tablet by mouth daily.        . cholecalciferol (VITAMIN D) 1000 UNITS tablet Take 1,000 Units by mouth daily.        Marland Kitchen CINNAMON PO Take 1,000 mg by mouth daily.        . clopidogrel (PLAVIX) 75 MG tablet Take 1 tablet (75 mg total) by mouth daily.  90 tablet  3  . escitalopram (LEXAPRO) 5 MG tablet Take 5 mg by mouth daily.        . fish oil-omega-3 fatty acids 1000 MG capsule Take 1 g by mouth daily.        . insulin aspart (NOVOLOG) 100 UNIT/ML injection Inject 11 Units into the skin 3 (three) times daily. Use sliding scale       . insulin glargine  (LANTUS) 100 UNIT/ML injection Inject 35 Units into the skin 2 (two) times daily.        . isosorbide mononitrate (IMDUR) 60 MG 24 hr tablet TAKE ONE-HALF TABLET EVERY MORNING  15 tablet  5  . metFORMIN (GLUCOPHAGE) 1000 MG tablet Take 1 tablet (1,000 mg total) by mouth 2 (two) times daily with a meal.  60 tablet  11  . Multiple Vitamin (MULTIVITAMIN) tablet Take 1 tablet by mouth daily.        . rosuvastatin (CRESTOR) 20 MG tablet Take 20 mg by mouth daily.        . Tamsulosin HCl (FLOMAX) 0.4 MG CAPS Take 0.4 mg by mouth daily.        . vitamin B-12 (CYANOCOBALAMIN) 500 MCG tablet Take 500 mcg by mouth daily.         Current Facility-Administered Medications on File Prior to Visit  Medication Dose Route Frequency Provider Last Rate Last Dose  . 0.9 %  sodium chloride infusion   Intravenous Continuous Ezequiel Kayser, MD 20 mL/hr at 02/16/12 1239    . zoledronic acid (RECLAST) injection 5 mg  5 mg Intravenous Once Ezequiel Kayser, MD   5 mg at 02/16/12 1306    Allergies  Allergen  Reactions  . Actos (Pioglitazone Hydrochloride)     swelling around lips  . Sulfonamide Derivatives     Red streaks on legs    Past Medical History  Diagnosis Date  . Coronary artery disease   . Diabetes mellitus   . Hyperlipidemia   . Prostate cancer   . Osteoporosis     receiving reclast 5 mg IV yearly    Past Surgical History  Procedure Date  . Cardiac catheterization 01/24/2007    EF 41%  . Cardiac catheterization 05/02/1999  . US echocardiography 07/07/2005    EF 55-60%  . Cardiovascular stress test 01/14/2007    EF 41%  . Coronary angioplasty with stent placement     LEFT ANTERIOR DESCENDING ARTERY AND DIAGONAL ARTERY    History  Smoking status  . Never Smoker   Smokeless tobacco  . Not on file    History  Alcohol Use  . Yes    occassionally    Family History  Problem Relation Age of Onset  . Coronary artery disease Father   . Heart failure Father     Reviw of Systems:  Reviewed  in the HPI.  All other systems are negative.  Physical Exam: BP 141/72  Pulse 62  Ht 6\' 2"  (1.88 m)  Wt 219 lb (99.338 kg)  BMI 28.12 kg/m2 The patient is alert and oriented x 3.  The mood and affect are normal.   Skin: warm and dry.  Color is normal.    HEENT:   the sclera are nonicteric.  The mucous membranes are moist.  The carotids are 2+ without bruits.  There is no thyromegaly.  There is no JVD.    Lungs: clear.  The chest wall is non tender.    Heart: regular rate with a normal S1 and S2.  There are no murmurs, gallops, or rubs. The PMI is not displaced.     Abdomen: good bowel sounds.  There is no guarding or rebound.  There is no hepatosplenomegaly or tenderness.  There are no masses.   Extremities:  no clubbing, cyanosis, or edema.  The legs are without rashes.  The distal pulses are intact.   Neuro:  Cranial nerves II - XII are intact.  Motor and sensory functions are intact.    The gait is normal.  ECG:  Assessment / Plan:

## 2012-03-12 ENCOUNTER — Emergency Department (INDEPENDENT_AMBULATORY_CARE_PROVIDER_SITE_OTHER): Payer: Medicare Other

## 2012-03-12 ENCOUNTER — Encounter (HOSPITAL_BASED_OUTPATIENT_CLINIC_OR_DEPARTMENT_OTHER): Payer: Self-pay

## 2012-03-12 ENCOUNTER — Emergency Department (HOSPITAL_BASED_OUTPATIENT_CLINIC_OR_DEPARTMENT_OTHER)
Admission: EM | Admit: 2012-03-12 | Discharge: 2012-03-12 | Disposition: A | Discharge status: Home / Self Care | Payer: Medicare Other | Attending: Emergency Medicine | Admitting: Emergency Medicine

## 2012-03-12 DIAGNOSIS — M810 Age-related osteoporosis without current pathological fracture: Secondary | ICD-10-CM | POA: Insufficient documentation

## 2012-03-12 DIAGNOSIS — M19049 Primary osteoarthritis, unspecified hand: Secondary | ICD-10-CM

## 2012-03-12 DIAGNOSIS — M79609 Pain in unspecified limb: Secondary | ICD-10-CM

## 2012-03-12 DIAGNOSIS — E119 Type 2 diabetes mellitus without complications: Secondary | ICD-10-CM | POA: Insufficient documentation

## 2012-03-12 DIAGNOSIS — M79645 Pain in left finger(s): Secondary | ICD-10-CM

## 2012-03-12 DIAGNOSIS — I2510 Atherosclerotic heart disease of native coronary artery without angina pectoris: Secondary | ICD-10-CM | POA: Insufficient documentation

## 2012-03-12 DIAGNOSIS — M7989 Other specified soft tissue disorders: Secondary | ICD-10-CM | POA: Diagnosis not present

## 2012-03-12 DIAGNOSIS — E785 Hyperlipidemia, unspecified: Secondary | ICD-10-CM | POA: Diagnosis not present

## 2012-03-12 HISTORY — DX: Frequency of micturition: R35.0

## 2012-03-12 MED ORDER — HYDROCODONE-ACETAMINOPHEN 5-325 MG PO TABS: 2.0000 | ORAL_TABLET | ORAL | Status: AC | PRN

## 2012-03-12 NOTE — ED Notes (Signed)
Pt states that he cannot move his L middle finger, minor swelling noted, unable to bend digit at all.  Pt states that he does not recall injuring the finger.

## 2012-03-12 NOTE — ED Provider Notes (Signed)
History     CSN: [id number]  Arrival date & time 03/12/12  1202   First MD Initiated Contact with Patient 03/12/12 1235      Chief Complaint  Patient presents with  . Finger Injury    (Consider location/radiation/quality/duration/timing/severity/associated sxs/prior treatment) HPI This 78 year old male has a few days of pain in his left hand volar third distal metacarpal head region only with inability to fully extend his left long finger. He can extend his left long finger without difficulty until he gets to approximately 20 from full extension. He can also flex his left long finger quite well without difficulty except for some isolated pain at the volar aspect of his left metacarpal head only. He is no swelling or tenderness to the finger. There is no skin redness or color change to his hand or finger. There is no trauma no weakness or numbness. He has no pain to the rest of his left hand and no pain to his left wrist. He has isolated pain and tenderness to his left volar metacarpal third head region only with pain worse when he flexes or extends his left long finger but not pain to the finger itself but only pain at the metacarpal head region. There is no treatment prior to arrival and no radiation of his discomfort with no associated symptoms. His pain is absent when he is not moving his finger but will become mild to moderate when he does move his left long finger in certain positions. He has no pain to his left thumb index ring or small fingers. Past Medical History  Diagnosis Date  . Coronary artery disease   . Diabetes mellitus   . Hyperlipidemia   . Prostate cancer   . Osteoporosis     receiving reclast 5 mg IV yearly  . Urinary frequency     Past Surgical History  Procedure Date  . Cardiac catheterization 01/24/2007    EF 41%  . Cardiac catheterization 05/02/1999  . US echocardiography 07/07/2005    EF 55-60%  . Cardiovascular stress test 01/14/2007    EF 41%  . Coronary  angioplasty with stent placement     LEFT ANTERIOR DESCENDING ARTERY AND DIAGONAL ARTERY    Family History  Problem Relation Age of Onset  . Coronary artery disease Father   . Heart failure Father     History  Substance Use Topics  . Smoking status: Never Smoker   . Smokeless tobacco: Not on file  . Alcohol Use: Yes     occassionally      Review of Systems  Constitutional: Negative for fever.       10 Systems reviewed and are negative for acute change except as noted in the HPI.  HENT: Negative for congestion.   Eyes: Negative for discharge and redness.  Respiratory: Negative for cough and shortness of breath.   Cardiovascular: Negative for chest pain.  Gastrointestinal: Negative for vomiting and abdominal pain.  Musculoskeletal: Negative for back pain.  Skin: Negative for rash.  Neurological: Negative for syncope, numbness and headaches.  Psychiatric/Behavioral:       No behavior change.    Allergies  Actos and Sulfonamide derivatives  Home Medications   Current Outpatient Rx  Name Route Sig Dispense Refill  . ASPIRIN EC 81 MG PO TBEC Oral Take 81 mg by mouth daily.      . BD PEN NEEDLE SHORT U/F 31G X 8 MM MISC Injection Inject as directed daily.     Marland Kitchen CALCIUM  CITRATE-VITAMIN D 315-200 MG-UNIT PO TABS Oral Take 1 tablet by mouth daily.      Marland Kitchen VITAMIN D 1000 UNITS PO TABS Oral Take 1,000 Units by mouth daily.      Marland Kitchen CINNAMON PO Oral Take 1,000 mg by mouth daily.      Marland Kitchen CLOPIDOGREL BISULFATE 75 MG PO TABS Oral Take 1 tablet (75 mg total) by mouth daily. 90 tablet 3  . ESCITALOPRAM OXALATE 5 MG PO TABS Oral Take 5 mg by mouth daily.      . OMEGA-3 FATTY ACIDS 1000 MG PO CAPS Oral Take 1 g by mouth daily.      . INSULIN ASPART 100 UNIT/ML [place] SOLN Subcutaneous Inject 11 Units into the skin 3 (three) times daily. Use sliding scale     . INSULIN GLARGINE 100 UNIT/ML [place] SOLN Subcutaneous Inject 35 Units into the skin 2 (two) times daily.      . ISOSORBIDE MONONITRATE ER  60 MG PO TB24  TAKE ONE-HALF TABLET EVERY MORNING 15 tablet 5  . METFORMIN HCL 1000 MG PO TABS Oral Take 1 tablet (1,000 mg total) by mouth 2 (two) times daily with a meal. 60 tablet 11  . ONE-DAILY MULTI VITAMINS PO TABS Oral Take 1 tablet by mouth daily.      Marland Kitchen ROSUVASTATIN CALCIUM 20 MG PO TABS Oral Take 20 mg by mouth daily.      Marland Kitchen TAMSULOSIN HCL 0.4 MG PO CAPS Oral Take 0.4 mg by mouth daily.      Marland Kitchen VITAMIN B-12 500 MCG PO TABS Oral Take 500 mcg by mouth daily.      Marland Kitchen HYDROCODONE-ACETAMINOPHEN 5-325 MG PO TABS Oral Take 2 tablets by mouth every 4 (four) hours as needed for pain. 10 tablet 0    BP 126/61  Pulse 61  Temp(Src) 98.4 F (36.9 C) (Oral)  Resp 16  Ht 6\' 2"  (1.88 m)  Wt 214 lb (97.07 kg)  BMI 27.48 kg/m2  SpO2 96%  Physical Exam  Nursing note and vitals reviewed. Constitutional:       Awake, alert, nontoxic appearance.  HENT:  Head: Atraumatic.  Eyes: Right eye exhibits no discharge. Left eye exhibits no discharge.  Neck: Neck supple.  Pulmonary/Chest: Effort normal. He exhibits no tenderness.  Abdominal: Soft. There is no tenderness. There is no rebound.  Musculoskeletal: He exhibits tenderness. He exhibits no edema.       Baseline ROM, no obvious new focal weakness. Patient's right arm is nontender his legs are nontender his left arm is nontender the upper arm forearm and wrist. His left hand is normal light touch with capillary refill less than 2 seconds full range of motion without pain or tenderness to his thumb index ring and small fingers. His left long finger is nontender and nonswollen with no tenderness or swelling to the PIP or PIP joint left long finger can be extended almost fully except for about 20 of from full extension limited by pain. He has full active flexion of the FDP and FDS against resistance with his left long finger with isolated pain and tenderness to the volar aspect of his third metacarpal head region only the left long finger otherwise  completely nontender and the rest of his left hand nontender with no erythema or swelling.  Neurological:       Mental status and motor strength appears baseline for patient and situation.  Skin: No rash noted.  Psychiatric: He has a normal mood and affect.  ED Course  Procedures (including critical care time)  Labs Reviewed - No data to display No results found.   1. Pain in finger of left hand       MDM  Patient / Family / Caregiver understand and agree with initial ED impression and plan with expectations set for ED visit.  Pt stable in ED with no significant deterioration in condition.  Patient / Family / Caregiver informed of clinical course, understand medical decision-making process, and agree with plan.  I doubt any other EMC precluding discharge at this time including, but not necessarily limited to the following:SBI.        Hurman Horn, MD 03/14/12 [phone]

## 2012-03-12 NOTE — Discharge Instructions (Signed)
Return sooner to the emergency department if you develop fever, redness to the left hand, and swelling, new weakness or numbness, red streaks to left hand or arm, swelling of left long finger or the rest of the hand, or other concerns.

## 2012-03-30 DIAGNOSIS — I10 Essential (primary) hypertension: Secondary | ICD-10-CM | POA: Diagnosis not present

## 2012-03-30 DIAGNOSIS — E1159 Type 2 diabetes mellitus with other circulatory complications: Secondary | ICD-10-CM | POA: Diagnosis not present

## 2012-05-01 ENCOUNTER — Encounter (HOSPITAL_BASED_OUTPATIENT_CLINIC_OR_DEPARTMENT_OTHER): Payer: Self-pay | Admitting: *Deleted

## 2012-05-01 ENCOUNTER — Emergency Department (HOSPITAL_BASED_OUTPATIENT_CLINIC_OR_DEPARTMENT_OTHER)
Admission: EM | Admit: 2012-05-01 | Discharge: 2012-05-02 | Disposition: A | Discharge status: Home / Self Care | Payer: Medicare Other | Attending: Emergency Medicine | Admitting: Emergency Medicine

## 2012-05-01 DIAGNOSIS — S0083XA Contusion of other part of head, initial encounter: Secondary | ICD-10-CM

## 2012-05-01 DIAGNOSIS — Z794 Long term (current) use of insulin: Secondary | ICD-10-CM | POA: Insufficient documentation

## 2012-05-01 DIAGNOSIS — M79609 Pain in unspecified limb: Secondary | ICD-10-CM | POA: Insufficient documentation

## 2012-05-01 DIAGNOSIS — I2510 Atherosclerotic heart disease of native coronary artery without angina pectoris: Secondary | ICD-10-CM | POA: Insufficient documentation

## 2012-05-01 DIAGNOSIS — S61217A Laceration without foreign body of left little finger without damage to nail, initial encounter: Secondary | ICD-10-CM

## 2012-05-01 DIAGNOSIS — E119 Type 2 diabetes mellitus without complications: Secondary | ICD-10-CM | POA: Insufficient documentation

## 2012-05-01 DIAGNOSIS — S0003XA Contusion of scalp, initial encounter: Secondary | ICD-10-CM | POA: Insufficient documentation

## 2012-05-01 DIAGNOSIS — S1093XA Contusion of unspecified part of neck, initial encounter: Secondary | ICD-10-CM | POA: Diagnosis not present

## 2012-05-01 DIAGNOSIS — W010XXA Fall on same level from slipping, tripping and stumbling without subsequent striking against object, initial encounter: Secondary | ICD-10-CM | POA: Insufficient documentation

## 2012-05-01 DIAGNOSIS — S61209A Unspecified open wound of unspecified finger without damage to nail, initial encounter: Secondary | ICD-10-CM | POA: Insufficient documentation

## 2012-05-01 DIAGNOSIS — H05239 Hemorrhage of unspecified orbit: Secondary | ICD-10-CM | POA: Insufficient documentation

## 2012-05-01 DIAGNOSIS — R609 Edema, unspecified: Secondary | ICD-10-CM | POA: Insufficient documentation

## 2012-05-01 DIAGNOSIS — S40029A Contusion of unspecified upper arm, initial encounter: Secondary | ICD-10-CM | POA: Insufficient documentation

## 2012-05-01 NOTE — ED Notes (Signed)
Pt states his "feet got tangled up" and he fell. C/O pain to left upper ext and left eye. Left eye is swollen and bruised. Pt states he can see out of the eye when he can open it.

## 2012-05-02 ENCOUNTER — Emergency Department (HOSPITAL_BASED_OUTPATIENT_CLINIC_OR_DEPARTMENT_OTHER): Payer: Medicare Other

## 2012-05-02 DIAGNOSIS — M79609 Pain in unspecified limb: Secondary | ICD-10-CM | POA: Diagnosis not present

## 2012-05-02 DIAGNOSIS — R609 Edema, unspecified: Secondary | ICD-10-CM | POA: Diagnosis not present

## 2012-05-02 DIAGNOSIS — I2510 Atherosclerotic heart disease of native coronary artery without angina pectoris: Secondary | ICD-10-CM | POA: Diagnosis not present

## 2012-05-02 DIAGNOSIS — S61209A Unspecified open wound of unspecified finger without damage to nail, initial encounter: Secondary | ICD-10-CM | POA: Diagnosis not present

## 2012-05-02 DIAGNOSIS — H05239 Hemorrhage of unspecified orbit: Secondary | ICD-10-CM | POA: Diagnosis not present

## 2012-05-02 DIAGNOSIS — Z794 Long term (current) use of insulin: Secondary | ICD-10-CM | POA: Diagnosis not present

## 2012-05-02 DIAGNOSIS — E119 Type 2 diabetes mellitus without complications: Secondary | ICD-10-CM | POA: Diagnosis not present

## 2012-05-02 DIAGNOSIS — T148XXA Other injury of unspecified body region, initial encounter: Secondary | ICD-10-CM | POA: Diagnosis not present

## 2012-05-02 DIAGNOSIS — S40029A Contusion of unspecified upper arm, initial encounter: Secondary | ICD-10-CM | POA: Diagnosis not present

## 2012-05-02 DIAGNOSIS — S0003XA Contusion of scalp, initial encounter: Secondary | ICD-10-CM | POA: Diagnosis not present

## 2012-05-02 MED ORDER — NAPROXEN 250 MG PO TABS: 500.0000 mg | ORAL_TABLET | Freq: Two times a day (BID) | ORAL | Status: DC

## 2012-05-02 MED ORDER — HYDROCODONE-ACETAMINOPHEN 5-325 MG PO TABS: 0.5000 | ORAL_TABLET | ORAL | Status: AC | PRN

## 2012-05-02 NOTE — ED Notes (Signed)
Pt presents to ED today after falling from standing.  Pt has swelling noted to left eye and is c/o pain to LUE.

## 2012-05-02 NOTE — ED Provider Notes (Addendum)
History   This chart was scribed for Hanley Seamen, MD by Melba Coon. The patient was seen in room MH03/MH03 and the patient's care was started at 12:45AM.    CSN: [id number]  Arrival date & time 05/01/12  2256   First MD Initiated Contact with Patient 05/02/12 0047      Chief Complaint  Patient presents with  . Fall    (Consider location/radiation/quality/duration/timing/severity/associated sxs/prior treatment) HPI [name] is a 78 y.o. male who presents to the Emergency Department complaining of constant, moderate to severe left eye and left forearm pain with an onset 7:30PM last night pertaining to a fall with head contact but no LOC. Pt fell forward in the kitchen when his feet "got tangled up". Pt broke the left arm a few years ago. No fever, neck pain, sore throat, rash, back pain, CP, SOB, abd pain, n/v/d, dysuria, or extremity edema, weakness, numbness, or tingling. Allergic to Actos and sulfonamide derivatives. No other pertinent medical symptoms.  Retinologist: Dr. Wallace Keller  Past Medical History  Diagnosis Date  . Coronary artery disease   . Diabetes mellitus   . Hyperlipidemia   . Prostate cancer   . Osteoporosis     receiving reclast 5 mg IV yearly  . Urinary frequency     Past Surgical History  Procedure Date  . Cardiac catheterization 01/24/2007    EF 41%  . Cardiac catheterization 05/02/1999  . US echocardiography 07/07/2005    EF 55-60%  . Cardiovascular stress test 01/14/2007    EF 41%  . Coronary angioplasty with stent placement     LEFT ANTERIOR DESCENDING ARTERY AND DIAGONAL ARTERY    Family History  Problem Relation Age of Onset  . Coronary artery disease Father   . Heart failure Father     History  Substance Use Topics  . Smoking status: Never Smoker   . Smokeless tobacco: Not on file  . Alcohol Use: Yes     occassionally      Review of Systems 10 Systems reviewed and all are negative for acute change except as noted in the  HPI.   Allergies  Actos and Sulfonamide derivatives  Home Medications   Current Outpatient Rx  Name Route Sig Dispense Refill  . ASPIRIN EC 81 MG PO TBEC Oral Take 81 mg by mouth daily.      . BD PEN NEEDLE SHORT U/F 31G X 8 MM MISC Injection Inject as directed daily.     Marland Kitchen CALCIUM CITRATE-VITAMIN D 315-200 MG-UNIT PO TABS Oral Take 1 tablet by mouth daily.      Marland Kitchen VITAMIN D 1000 UNITS PO TABS Oral Take 1,000 Units by mouth daily.      Marland Kitchen CINNAMON PO Oral Take 1,000 mg by mouth daily.      Marland Kitchen ESCITALOPRAM OXALATE 5 MG PO TABS Oral Take 5 mg by mouth daily.      . OMEGA-3 FATTY ACIDS 1000 MG PO CAPS Oral Take 1 g by mouth daily.      . INSULIN ASPART 100 UNIT/ML [place] SOLN Subcutaneous Inject 11 Units into the skin 3 (three) times daily. Use sliding scale     . INSULIN GLARGINE 100 UNIT/ML [place] SOLN Subcutaneous Inject 35 Units into the skin 2 (two) times daily.      . ISOSORBIDE MONONITRATE ER 60 MG PO TB24  TAKE ONE-HALF TABLET EVERY MORNING 15 tablet 5  . METFORMIN HCL 1000 MG PO TABS Oral Take 1 tablet (1,000 mg total) by  mouth 2 (two) times daily with a meal. 60 tablet 11  . ONE-DAILY MULTI VITAMINS PO TABS Oral Take 1 tablet by mouth daily.      Marland Kitchen ROSUVASTATIN CALCIUM 20 MG PO TABS Oral Take 20 mg by mouth daily.      Marland Kitchen TAMSULOSIN HCL 0.4 MG PO CAPS Oral Take 0.4 mg by mouth daily.      Marland Kitchen VITAMIN B-12 500 MCG PO TABS Oral Take 500 mcg by mouth daily.        BP 147/61  Pulse 58  Temp(Src) 97.9 F (36.6 C) (Oral)  Resp 20  Ht 6\' 2"  (1.88 m)  Wt 217 lb (98.431 kg)  BMI 27.86 kg/m2  SpO2 95%  Physical Exam  Nursing note and vitals reviewed. Constitutional: He is oriented to person, place, and time. He appears well-developed and well-nourished.       Awake, alert, nontoxic appearance.  HENT:  Head: Normocephalic.  Right Ear: External ear normal.  Left Ear: External ear normal.       Head: small hematoma on left side of chin Ears: No hemotympanum  Eyes: Pupils are equal, round,  and reactive to light. Right eye exhibits no discharge. Left eye exhibits no discharge.       No hyphema but left periorbital hematoma; EOM: Rt eye nml but couldn't asses left eye due to swelling  Neck: Normal range of motion. Neck supple.       Non-tender C-spine  Cardiovascular: Normal rate, regular rhythm and normal heart sounds.   No murmur heard.      Good pulses on lower extremities  Pulmonary/Chest: Effort normal and breath sounds normal. He has no wheezes. He exhibits no tenderness.  Abdominal: Soft. There is no tenderness. There is no rebound.  Musculoskeletal: He exhibits edema (Trace edema of lower extremities) and tenderness (left proximal forearm has pain on passive or active movement of left elbow).       Baseline ROM, no obvious new focal weakness. NV intact left arm.  Neurological: He is alert and oriented to person, place, and time.       Mental status and motor strength appears baseline for patient and situation.  Skin: Skin is warm. No rash noted.       Acute superficial abrasion of left knee; healing skin tears of left forearm and left shin  Psychiatric: He has a normal mood and affect. His behavior is normal.   Addendum: Laceration over her the dorsal aspect of the left fifth finger proximal interphalangeal joint  ED Course  Procedures (including critical care time)  LACERATION REPAIR Performed by: Julie Paolini L Authorized by: Hanley Seamen Consent: Verbal consent obtained. Risks and benefits: risks, benefits and alternatives were discussed Consent given by: patient Patient identity confirmed: provided demographic data Prepped and Draped in normal sterile fashion Wound explored  Laceration Location: Left little finger  Laceration Length: 1 cm  No Foreign Bodies seen or palpated  Anesthesia: local infiltration  Local anesthetic: lidocaine 2% without epinephrine  Anesthetic total: 0.25 ml  Irrigation method: syringe Amount of cleaning: standard  Skin  closure: 5-0 Ethilon  Number of sutures: 2  Technique: simple interrupted  Patient tolerance: Patient tolerated the procedure well with no immediate complications.   DIAGNOSTIC STUDIES: Oxygen Saturation is 95% on room air, adequate by my interpretation.    COORDINATION OF CARE:  12:57PM - EDMD will order orbit CT, head CT, left elbow and forearm XRs, and pain meds for the pt.    MDM  Nursing notes and vitals signs, including pulse oximetry, reviewed.  Summary of this visit's results, reviewed by myself:  Imaging Studies: Dg Elbow Complete Left  05/04/2012  *RADIOLOGY REPORT*  Clinical Data: Left elbow pain status post fall  LEFT ELBOW - COMPLETE 3+ VIEW  Comparison: None.  Findings: Degenerative changes and evidence of prior distal left humerus fracture. Limited lateral view, limits evaluation for a joint effusion.  No displaced fracture is identified.  IMPRESSION: No displaced fracture identified.  Limited lateral view, limits evaluation for joint effusion. If concern for a nondisplaced fracture persists, this should be repeated.  Original Report Authenticated By: Waneta Martins, M.D.   Dg Forearm Left  05-04-12  *RADIOLOGY REPORT*  Clinical Data: Left lower arm pain after fall.  LEFT FOREARM - 2 VIEW  Comparison: None.  Findings: No acute fracture or dislocation.  Mild radiocarpal joint space narrowing.  Irregularity of the distal humerus will be detailed on elbow films.  IMPRESSION: No acute finding about the forearm.  Original Report Authenticated By: Consuello Bossier, M.D.   Ct Head Wo Contrast  2012-05-04  *RADIOLOGY REPORT*  Clinical Data: Fall  CT HEAD WITHOUT CONTRAST,CT ORBITS WITHOUT CONTRAST  Technique:  Contiguous axial images were obtained from the base of the skull through the vertex without contrast.,Technique: Multidetector CT imaging of the orbits was performed following the standard protocol without intravenous contrast.  Comparison: None.  Findings:  Head:  Prominence of the sulci, cisterns, and ventricles, in keeping with volume loss. There are subcortical and periventricular white matter hypodensities, a nonspecific finding most often seen with chronic microangiopathic changes.  There is no evidence for acute hemorrhage, overt hydrocephalus, mass lesion, or abnormal extra-axial fluid collection.  No definite CT evidence for acute cortical based (large artery) infarction. Right basal ganglia lacunar infarction is unchanged from 2010.  Orbits:  Large left frontal and preseptal hematoma.  Postoperative changes of the globes bilaterally.  Globes are symmetric in size. No retrobulbar hematoma.  The visualized paranasal sinuses are predominately clear, with mucosal thickening in the maxillary sinuses.  No acute fracture of the orbital walls or sinus walls.  IMPRESSION: White matter changes, volume loss, and remote appearing right basal ganglia lacunar infarction.  No definite acute intracranial abnormality.  Left frontal and preseptal hematoma.  No underlying fracture or retrobulbar hematoma.  Original Report Authenticated By: Waneta Martins, M.D.   Ct Orbitss W/o Cm  05/04/12  *RADIOLOGY REPORT*  Clinical Data: Fall  CT HEAD WITHOUT CONTRAST,CT ORBITS WITHOUT CONTRAST  Technique:  Contiguous axial images were obtained from the base of the skull through the vertex without contrast.,Technique: Multidetector CT imaging of the orbits was performed following the standard protocol without intravenous contrast.  Comparison: None.  Findings:  Head: Prominence of the sulci, cisterns, and ventricles, in keeping with volume loss. There are subcortical and periventricular white matter hypodensities, a nonspecific finding most often seen with chronic microangiopathic changes.  There is no evidence for acute hemorrhage, overt hydrocephalus, mass lesion, or abnormal extra-axial fluid collection.  No definite CT evidence for acute cortical based (large artery) infarction. Right  basal ganglia lacunar infarction is unchanged from 2010.  Orbits:  Large left frontal and preseptal hematoma.  Postoperative changes of the globes bilaterally.  Globes are symmetric in size. No retrobulbar hematoma.  The visualized paranasal sinuses are predominately clear, with mucosal thickening in the maxillary sinuses.  No acute fracture of the orbital walls or sinus walls.  IMPRESSION: White matter changes, volume loss, and  remote appearing right basal ganglia lacunar infarction.  No definite acute intracranial abnormality.  Left frontal and preseptal hematoma.  No underlying fracture or retrobulbar hematoma.  Original Report Authenticated By: Waneta Martins, M.D.   Patient was advised to follow up with his ophthalmologist for complete evaluation of the left eye. The patient states his vision when left eyelids are held open is normal for him.  The patient will follow up with Dr. Allie Bossier, his orthopedist, should pain in his left elbow and forearm persist.  The patient has an appointment with his PCP, Dr. Waynard Edwards, and a few days. The patient was advised to have Dr. Waynard Edwards evaluate the finger and remove the stitches at the appropriate time.   I personally performed the services described in this documentation, which was scribed in my presence.  The recorded information has been reviewed and considered.         Hanley Seamen, MD 05/02/12 0221  Hanley Seamen, MD 05/02/12 [id number]

## 2012-05-09 DIAGNOSIS — S1093XA Contusion of unspecified part of neck, initial encounter: Secondary | ICD-10-CM | POA: Diagnosis not present

## 2012-05-09 DIAGNOSIS — S5010XA Contusion of unspecified forearm, initial encounter: Secondary | ICD-10-CM | POA: Diagnosis not present

## 2012-05-09 DIAGNOSIS — S0003XA Contusion of scalp, initial encounter: Secondary | ICD-10-CM | POA: Diagnosis not present

## 2012-05-09 DIAGNOSIS — S61209A Unspecified open wound of unspecified finger without damage to nail, initial encounter: Secondary | ICD-10-CM | POA: Diagnosis not present

## 2012-05-09 DIAGNOSIS — Z4802 Encounter for removal of sutures: Secondary | ICD-10-CM | POA: Diagnosis not present

## 2012-05-17 DIAGNOSIS — Z9181 History of falling: Secondary | ICD-10-CM | POA: Diagnosis not present

## 2012-05-17 DIAGNOSIS — R269 Unspecified abnormalities of gait and mobility: Secondary | ICD-10-CM | POA: Diagnosis not present

## 2012-05-18 DIAGNOSIS — M2040 Other hammer toe(s) (acquired), unspecified foot: Secondary | ICD-10-CM | POA: Diagnosis not present

## 2012-05-18 DIAGNOSIS — E119 Type 2 diabetes mellitus without complications: Secondary | ICD-10-CM | POA: Diagnosis not present

## 2012-05-18 DIAGNOSIS — L84 Corns and callosities: Secondary | ICD-10-CM | POA: Diagnosis not present

## 2012-05-18 DIAGNOSIS — L608 Other nail disorders: Secondary | ICD-10-CM | POA: Diagnosis not present

## 2012-05-19 DIAGNOSIS — R269 Unspecified abnormalities of gait and mobility: Secondary | ICD-10-CM | POA: Diagnosis not present

## 2012-05-19 DIAGNOSIS — Z9181 History of falling: Secondary | ICD-10-CM | POA: Diagnosis not present

## 2012-05-20 DIAGNOSIS — Z9181 History of falling: Secondary | ICD-10-CM | POA: Diagnosis not present

## 2012-05-20 DIAGNOSIS — R269 Unspecified abnormalities of gait and mobility: Secondary | ICD-10-CM | POA: Diagnosis not present

## 2012-05-24 DIAGNOSIS — Z9181 History of falling: Secondary | ICD-10-CM | POA: Diagnosis not present

## 2012-05-24 DIAGNOSIS — R269 Unspecified abnormalities of gait and mobility: Secondary | ICD-10-CM | POA: Diagnosis not present

## 2012-05-25 DIAGNOSIS — R269 Unspecified abnormalities of gait and mobility: Secondary | ICD-10-CM | POA: Diagnosis not present

## 2012-05-25 DIAGNOSIS — Z9181 History of falling: Secondary | ICD-10-CM | POA: Diagnosis not present

## 2012-05-27 DIAGNOSIS — R269 Unspecified abnormalities of gait and mobility: Secondary | ICD-10-CM | POA: Diagnosis not present

## 2012-05-27 DIAGNOSIS — Z9181 History of falling: Secondary | ICD-10-CM | POA: Diagnosis not present

## 2012-05-31 DIAGNOSIS — Z9181 History of falling: Secondary | ICD-10-CM | POA: Diagnosis not present

## 2012-05-31 DIAGNOSIS — R269 Unspecified abnormalities of gait and mobility: Secondary | ICD-10-CM | POA: Diagnosis not present

## 2012-06-01 DIAGNOSIS — Z9181 History of falling: Secondary | ICD-10-CM | POA: Diagnosis not present

## 2012-06-01 DIAGNOSIS — R269 Unspecified abnormalities of gait and mobility: Secondary | ICD-10-CM | POA: Diagnosis not present

## 2012-06-02 DIAGNOSIS — L570 Actinic keratosis: Secondary | ICD-10-CM | POA: Diagnosis not present

## 2012-06-03 DIAGNOSIS — R269 Unspecified abnormalities of gait and mobility: Secondary | ICD-10-CM | POA: Diagnosis not present

## 2012-06-03 DIAGNOSIS — Z9181 History of falling: Secondary | ICD-10-CM | POA: Diagnosis not present

## 2012-06-07 DIAGNOSIS — R269 Unspecified abnormalities of gait and mobility: Secondary | ICD-10-CM | POA: Diagnosis not present

## 2012-06-07 DIAGNOSIS — Z9181 History of falling: Secondary | ICD-10-CM | POA: Diagnosis not present

## 2012-06-08 DIAGNOSIS — Z9181 History of falling: Secondary | ICD-10-CM | POA: Diagnosis not present

## 2012-06-08 DIAGNOSIS — R269 Unspecified abnormalities of gait and mobility: Secondary | ICD-10-CM | POA: Diagnosis not present

## 2012-06-10 DIAGNOSIS — R269 Unspecified abnormalities of gait and mobility: Secondary | ICD-10-CM | POA: Diagnosis not present

## 2012-06-10 DIAGNOSIS — Z9181 History of falling: Secondary | ICD-10-CM | POA: Diagnosis not present

## 2012-06-13 DIAGNOSIS — E1159 Type 2 diabetes mellitus with other circulatory complications: Secondary | ICD-10-CM | POA: Diagnosis not present

## 2012-06-13 DIAGNOSIS — I2510 Atherosclerotic heart disease of native coronary artery without angina pectoris: Secondary | ICD-10-CM | POA: Diagnosis not present

## 2012-06-13 DIAGNOSIS — S0993XA Unspecified injury of face, initial encounter: Secondary | ICD-10-CM | POA: Diagnosis not present

## 2012-06-13 DIAGNOSIS — E1149 Type 2 diabetes mellitus with other diabetic neurological complication: Secondary | ICD-10-CM | POA: Diagnosis not present

## 2012-06-13 DIAGNOSIS — I10 Essential (primary) hypertension: Secondary | ICD-10-CM | POA: Diagnosis not present

## 2012-06-13 DIAGNOSIS — L738 Other specified follicular disorders: Secondary | ICD-10-CM | POA: Diagnosis not present

## 2012-06-13 DIAGNOSIS — L2089 Other atopic dermatitis: Secondary | ICD-10-CM | POA: Diagnosis not present

## 2012-06-13 DIAGNOSIS — M2040 Other hammer toe(s) (acquired), unspecified foot: Secondary | ICD-10-CM | POA: Diagnosis not present

## 2012-06-13 DIAGNOSIS — S199XXA Unspecified injury of neck, initial encounter: Secondary | ICD-10-CM | POA: Diagnosis not present

## 2012-06-14 DIAGNOSIS — Z9181 History of falling: Secondary | ICD-10-CM | POA: Diagnosis not present

## 2012-06-14 DIAGNOSIS — R269 Unspecified abnormalities of gait and mobility: Secondary | ICD-10-CM | POA: Diagnosis not present

## 2012-06-15 DIAGNOSIS — Z9181 History of falling: Secondary | ICD-10-CM | POA: Diagnosis not present

## 2012-06-15 DIAGNOSIS — R269 Unspecified abnormalities of gait and mobility: Secondary | ICD-10-CM | POA: Diagnosis not present

## 2012-06-16 DIAGNOSIS — H43819 Vitreous degeneration, unspecified eye: Secondary | ICD-10-CM | POA: Diagnosis not present

## 2012-06-16 DIAGNOSIS — E119 Type 2 diabetes mellitus without complications: Secondary | ICD-10-CM | POA: Diagnosis not present

## 2012-06-16 DIAGNOSIS — H33029 Retinal detachment with multiple breaks, unspecified eye: Secondary | ICD-10-CM | POA: Diagnosis not present

## 2012-06-16 DIAGNOSIS — Z961 Presence of intraocular lens: Secondary | ICD-10-CM | POA: Diagnosis not present

## 2012-06-20 DIAGNOSIS — Z9181 History of falling: Secondary | ICD-10-CM | POA: Diagnosis not present

## 2012-06-20 DIAGNOSIS — R269 Unspecified abnormalities of gait and mobility: Secondary | ICD-10-CM | POA: Diagnosis not present

## 2012-06-22 DIAGNOSIS — Z9181 History of falling: Secondary | ICD-10-CM | POA: Diagnosis not present

## 2012-06-22 DIAGNOSIS — R269 Unspecified abnormalities of gait and mobility: Secondary | ICD-10-CM | POA: Diagnosis not present

## 2012-06-27 DIAGNOSIS — Z9181 History of falling: Secondary | ICD-10-CM | POA: Diagnosis not present

## 2012-06-27 DIAGNOSIS — R269 Unspecified abnormalities of gait and mobility: Secondary | ICD-10-CM | POA: Diagnosis not present

## 2012-06-28 DIAGNOSIS — I2510 Atherosclerotic heart disease of native coronary artery without angina pectoris: Secondary | ICD-10-CM | POA: Diagnosis not present

## 2012-06-28 DIAGNOSIS — E1159 Type 2 diabetes mellitus with other circulatory complications: Secondary | ICD-10-CM | POA: Diagnosis not present

## 2012-06-28 DIAGNOSIS — I10 Essential (primary) hypertension: Secondary | ICD-10-CM | POA: Diagnosis not present

## 2012-06-30 DIAGNOSIS — Z9181 History of falling: Secondary | ICD-10-CM | POA: Diagnosis not present

## 2012-06-30 DIAGNOSIS — R269 Unspecified abnormalities of gait and mobility: Secondary | ICD-10-CM | POA: Diagnosis not present

## 2012-07-05 ENCOUNTER — Emergency Department (HOSPITAL_BASED_OUTPATIENT_CLINIC_OR_DEPARTMENT_OTHER)
Admission: EM | Admit: 2012-07-05 | Discharge: 2012-07-05 | Disposition: A | Discharge status: Home / Self Care | Payer: Medicare Other | Attending: Emergency Medicine | Admitting: Emergency Medicine

## 2012-07-05 ENCOUNTER — Emergency Department (HOSPITAL_BASED_OUTPATIENT_CLINIC_OR_DEPARTMENT_OTHER): Payer: Medicare Other

## 2012-07-05 ENCOUNTER — Encounter (HOSPITAL_BASED_OUTPATIENT_CLINIC_OR_DEPARTMENT_OTHER): Payer: Self-pay

## 2012-07-05 DIAGNOSIS — Z8546 Personal history of malignant neoplasm of prostate: Secondary | ICD-10-CM | POA: Insufficient documentation

## 2012-07-05 DIAGNOSIS — Z9861 Coronary angioplasty status: Secondary | ICD-10-CM | POA: Diagnosis not present

## 2012-07-05 DIAGNOSIS — M810 Age-related osteoporosis without current pathological fracture: Secondary | ICD-10-CM | POA: Diagnosis not present

## 2012-07-05 DIAGNOSIS — S2239XA Fracture of one rib, unspecified side, initial encounter for closed fracture: Secondary | ICD-10-CM | POA: Insufficient documentation

## 2012-07-05 DIAGNOSIS — Z79899 Other long term (current) drug therapy: Secondary | ICD-10-CM | POA: Diagnosis not present

## 2012-07-05 DIAGNOSIS — S43006A Unspecified dislocation of unspecified shoulder joint, initial encounter: Secondary | ICD-10-CM | POA: Diagnosis not present

## 2012-07-05 DIAGNOSIS — Z043 Encounter for examination and observation following other accident: Secondary | ICD-10-CM | POA: Diagnosis not present

## 2012-07-05 DIAGNOSIS — E785 Hyperlipidemia, unspecified: Secondary | ICD-10-CM | POA: Diagnosis not present

## 2012-07-05 DIAGNOSIS — E119 Type 2 diabetes mellitus without complications: Secondary | ICD-10-CM | POA: Insufficient documentation

## 2012-07-05 DIAGNOSIS — W010XXA Fall on same level from slipping, tripping and stumbling without subsequent striking against object, initial encounter: Secondary | ICD-10-CM | POA: Insufficient documentation

## 2012-07-05 DIAGNOSIS — I2510 Atherosclerotic heart disease of native coronary artery without angina pectoris: Secondary | ICD-10-CM | POA: Insufficient documentation

## 2012-07-05 DIAGNOSIS — Z794 Long term (current) use of insulin: Secondary | ICD-10-CM | POA: Diagnosis not present

## 2012-07-05 DIAGNOSIS — S43016A Anterior dislocation of unspecified humerus, initial encounter: Secondary | ICD-10-CM | POA: Diagnosis not present

## 2012-07-05 MED ORDER — OXYCODONE-ACETAMINOPHEN 5-325 MG PO TABS: 0.5000 | ORAL_TABLET | Freq: Four times a day (QID) | ORAL | Status: AC | PRN

## 2012-07-05 NOTE — ED Notes (Signed)
Pt reports he fell Saturday and continues to have left hip and side pain.

## 2012-07-05 NOTE — ED Provider Notes (Signed)
History     CSN: [id number]  Arrival date & time 07/05/12  1552   First MD Initiated Contact with Patient 07/05/12 [phone]      Chief Complaint  Patient presents with  . Fall  . Hip Pain    (Consider location/radiation/quality/duration/timing/severity/associated sxs/prior treatment) HPI Pt reports he fell about four days ago, slipped on floor in kitchen landing on his left side, hit his head but did not have LOC. States he has had worsening pain on left side, particularly in lower ribs and shoulder since then. He is comfortable when lying still but pain increases with movement. He denies any hip pain to me and able to stand and ambulate without difficulty.   Past Medical History  Diagnosis Date  . Coronary artery disease   . Diabetes mellitus   . Hyperlipidemia   . Prostate cancer   . Osteoporosis     receiving reclast 5 mg IV yearly  . Urinary frequency     Past Surgical History  Procedure Date  . Cardiac catheterization 01/24/2007    EF 41%  . Cardiac catheterization 05/02/1999  . US echocardiography 07/07/2005    EF 55-60%  . Cardiovascular stress test 01/14/2007    EF 41%  . Coronary angioplasty with stent placement     LEFT ANTERIOR DESCENDING ARTERY AND DIAGONAL ARTERY    Family History  Problem Relation Age of Onset  . Coronary artery disease Father   . Heart failure Father     History  Substance Use Topics  . Smoking status: Never Smoker   . Smokeless tobacco: Not on file  . Alcohol Use: Yes     occassionally      Review of Systems All other systems reviewed and are negative except as noted in HPI.   Allergies  Actos and Sulfonamide derivatives  Home Medications   Current Outpatient Rx  Name Route Sig Dispense Refill  . ASPIRIN EC 81 MG PO TBEC Oral Take 81 mg by mouth daily.      . BD PEN NEEDLE SHORT U/F 31G X 8 MM MISC Injection Inject as directed daily.     Marland Kitchen CALCIUM CITRATE-VITAMIN D 315-200 MG-UNIT PO TABS Oral Take 1 tablet by mouth daily.       Marland Kitchen VITAMIN D 1000 UNITS PO TABS Oral Take 1,000 Units by mouth daily.      Marland Kitchen CINNAMON PO Oral Take 1,000 mg by mouth daily.      Marland Kitchen ESCITALOPRAM OXALATE 5 MG PO TABS Oral Take 5 mg by mouth daily.      . OMEGA-3 FATTY ACIDS 1000 MG PO CAPS Oral Take 1 g by mouth daily.      . IBUPROFEN 200 MG PO TABS Oral Take 200 mg by mouth every 6 (six) hours as needed. For pain.    . INSULIN ASPART 100 UNIT/ML [place] SOLN Subcutaneous Inject 11 Units into the skin 3 (three) times daily. Use sliding scale    . INSULIN GLARGINE 100 UNIT/ML [place] SOLN Subcutaneous Inject 35 Units into the skin 2 (two) times daily.     . ISOSORBIDE MONONITRATE ER 60 MG PO TB24  TAKE ONE-HALF TABLET EVERY MORNING 15 tablet 5  . METFORMIN HCL 1000 MG PO TABS Oral Take 1 tablet (1,000 mg total) by mouth 2 (two) times daily with a meal. 60 tablet 11  . ONE-DAILY MULTI VITAMINS PO TABS Oral Take 1 tablet by mouth daily.      Marland Kitchen ROSUVASTATIN CALCIUM 20 MG PO TABS  Oral Take 20 mg by mouth daily.      Marland Kitchen TAMSULOSIN HCL 0.4 MG PO CAPS Oral Take 0.4 mg by mouth daily.      Marland Kitchen VITAMIN B-12 500 MCG PO TABS Oral Take 500 mcg by mouth daily.        BP 116/53  Pulse 71  Temp 98 F (36.7 C) (Oral)  Resp 18  Ht 6\' 2"  (1.88 m)  Wt 214 lb (97.07 kg)  BMI 27.48 kg/m2  SpO2 92%  Physical Exam  Nursing note and vitals reviewed. Constitutional: He is oriented to person, place, and time. He appears well-developed and well-nourished.  HENT:  Head: Normocephalic and atraumatic.  Eyes: EOM are normal. Pupils are equal, round, and reactive to light.  Neck: Normal range of motion. Neck supple.  Cardiovascular: Normal rate, normal heart sounds and intact distal pulses.   Pulmonary/Chest: Effort normal and breath sounds normal. He exhibits tenderness.    Abdominal: Bowel sounds are normal. He exhibits no distension. There is no tenderness.  Musculoskeletal: Normal range of motion. He exhibits tenderness. He exhibits no edema.       Arms:       Tenderness over the L AC joint, otherwise normal extremities exam, no concern for hip or pelvic fracture  Neurological: He is alert and oriented to person, place, and time. He has normal strength. No cranial nerve deficit or sensory deficit.  Skin: Skin is warm and dry. No rash noted.  Psychiatric: He has a normal mood and affect.    ED Course  Procedures (including critical care time)  Labs Reviewed - No data to display Dg Ribs Unilateral W/chest Left  07/05/2012  *RADIOLOGY REPORT*  Clinical Data: Larey Seat today left lower anterior rib pain and chest pain  LEFT RIBS AND CHEST - 3+ VIEW  Comparison: Chest x-ray of 01/30/2012 and CT angio chest of 10/04/2011  Findings: Mild basilar atelectasis or scarring remains.  No active infiltrate or effusion is seen.  Mediastinal contours are stable. The heart is within upper limits of normal.  There is an acute fracture of the anterior left 11th rib and there may be fractures of the left anterior ninth and tenth ribs as well.  The ninth and tenth ribs are more difficult to assess and may these fractures be subacute or chronic.  No other acute abnormality is seen.  IMPRESSION:  1.  Acute fracture of the left anterior 11th rib and possibly the left anterior ninth and tenth ribs. 2.  Basilar linear atelectasis or scarring.  No pneumothorax.  Original Report Authenticated By: Juline Patch, M.D.   Dg Shoulder Left  07/05/2012  *RADIOLOGY REPORT*  Clinical Data: Larey Seat today with left lower chest and shoulder pain  LEFT SHOULDER - 2+ VIEW  Comparison: None.  Findings: There is subluxation of the left humeral head which may be chronic. Some deformity of the proximal left humerus may be due to prior fracture with healing.  There is significant degenerative joint disease of the left shoulder with loss of joint space, sclerosis, and spurring.  The left [facility] joint is normally aligned. The bones are osteopenic.  IMPRESSION:  1.  Subluxation of the left humeral head may be due to  prior trauma and subsequent degenerative change. 2.  Significant degenerative joint disease of the left shoulder. Osteopenia.  Original Report Authenticated By: Juline Patch, M.D.     No diagnosis found.    MDM  Xrays as above, pt has several lower rib fractures.  No acute shoulder injury. He will continue NSAIDs and add oxycodone as needed for breakthrough pain. Advised not to drive while taking pain medications.        Charles B. Bernette Mayers, MD 07/05/12 1610

## 2012-07-14 DIAGNOSIS — Z9181 History of falling: Secondary | ICD-10-CM | POA: Diagnosis not present

## 2012-07-14 DIAGNOSIS — R269 Unspecified abnormalities of gait and mobility: Secondary | ICD-10-CM | POA: Diagnosis not present

## 2012-07-18 DIAGNOSIS — Z9181 History of falling: Secondary | ICD-10-CM | POA: Diagnosis not present

## 2012-07-18 DIAGNOSIS — R269 Unspecified abnormalities of gait and mobility: Secondary | ICD-10-CM | POA: Diagnosis not present

## 2012-07-20 ENCOUNTER — Encounter (HOSPITAL_BASED_OUTPATIENT_CLINIC_OR_DEPARTMENT_OTHER): Payer: Self-pay | Admitting: *Deleted

## 2012-07-20 ENCOUNTER — Emergency Department (HOSPITAL_BASED_OUTPATIENT_CLINIC_OR_DEPARTMENT_OTHER)
Admission: EM | Admit: 2012-07-20 | Discharge: 2012-07-21 | Disposition: A | Discharge status: Home / Self Care | Payer: Medicare Other | Attending: Emergency Medicine | Admitting: Emergency Medicine

## 2012-07-20 DIAGNOSIS — M109 Gout, unspecified: Secondary | ICD-10-CM | POA: Diagnosis not present

## 2012-07-20 DIAGNOSIS — Z882 Allergy status to sulfonamides status: Secondary | ICD-10-CM | POA: Insufficient documentation

## 2012-07-20 DIAGNOSIS — Z794 Long term (current) use of insulin: Secondary | ICD-10-CM | POA: Insufficient documentation

## 2012-07-20 DIAGNOSIS — I2510 Atherosclerotic heart disease of native coronary artery without angina pectoris: Secondary | ICD-10-CM | POA: Insufficient documentation

## 2012-07-20 DIAGNOSIS — E119 Type 2 diabetes mellitus without complications: Secondary | ICD-10-CM | POA: Insufficient documentation

## 2012-07-20 DIAGNOSIS — Z8249 Family history of ischemic heart disease and other diseases of the circulatory system: Secondary | ICD-10-CM | POA: Insufficient documentation

## 2012-07-20 DIAGNOSIS — Z888 Allergy status to other drugs, medicaments and biological substances status: Secondary | ICD-10-CM | POA: Insufficient documentation

## 2012-07-20 DIAGNOSIS — Z8546 Personal history of malignant neoplasm of prostate: Secondary | ICD-10-CM | POA: Insufficient documentation

## 2012-07-20 NOTE — ED Notes (Signed)
Pt c/o right big toe pain 

## 2012-07-21 DIAGNOSIS — M109 Gout, unspecified: Secondary | ICD-10-CM | POA: Diagnosis not present

## 2012-07-21 DIAGNOSIS — Z888 Allergy status to other drugs, medicaments and biological substances status: Secondary | ICD-10-CM | POA: Diagnosis not present

## 2012-07-21 DIAGNOSIS — I10 Essential (primary) hypertension: Secondary | ICD-10-CM | POA: Diagnosis not present

## 2012-07-21 DIAGNOSIS — Z8546 Personal history of malignant neoplasm of prostate: Secondary | ICD-10-CM | POA: Diagnosis not present

## 2012-07-21 DIAGNOSIS — I2510 Atherosclerotic heart disease of native coronary artery without angina pectoris: Secondary | ICD-10-CM | POA: Diagnosis not present

## 2012-07-21 DIAGNOSIS — M79609 Pain in unspecified limb: Secondary | ICD-10-CM | POA: Diagnosis not present

## 2012-07-21 DIAGNOSIS — E119 Type 2 diabetes mellitus without complications: Secondary | ICD-10-CM | POA: Diagnosis not present

## 2012-07-21 DIAGNOSIS — Z8249 Family history of ischemic heart disease and other diseases of the circulatory system: Secondary | ICD-10-CM | POA: Diagnosis not present

## 2012-07-21 DIAGNOSIS — Z794 Long term (current) use of insulin: Secondary | ICD-10-CM | POA: Diagnosis not present

## 2012-07-21 DIAGNOSIS — Z882 Allergy status to sulfonamides status: Secondary | ICD-10-CM | POA: Diagnosis not present

## 2012-07-21 MED ORDER — NAPROXEN 250 MG PO TABS
500.0000 mg | ORAL_TABLET | Freq: Once | ORAL | Status: AC
  Administered 2012-07-21: 500 mg via ORAL
  Filled 2012-07-21: qty 2

## 2012-07-21 MED ORDER — NAPROXEN 500 MG PO TABS: ORAL_TABLET | ORAL | Status: DC

## 2012-07-21 NOTE — ED Provider Notes (Signed)
History     CSN: [id number]  Arrival date & time 07/20/12  2205   First MD Initiated Contact with Patient 07/21/12 0019      Chief Complaint  Patient presents with  . Toe Pain    (Consider location/radiation/quality/duration/timing/severity/associated sxs/prior treatment) HPI This is an 78 year old white male with who stubbed his right great toe about 10 days ago. There was a superficial wound to the medial aspect of the toe at about the interphalangeal joint. That wound has healed well without apparent sequelae. He now complains of moderate pain in the right first metatarsophalangeal joint. The pain is worse with ambulation or palpation of that joint. Range of motion at that joint is limited. He has no history of gout. He denies recent reinjury of that toe. He denies other acute complaint.  Past Medical History  Diagnosis Date  . Coronary artery disease   . Diabetes mellitus   . Hyperlipidemia   . Prostate cancer   . Osteoporosis     receiving reclast 5 mg IV yearly  . Urinary frequency     Past Surgical History  Procedure Date  . Cardiac catheterization 01/24/2007    EF 41%  . Cardiac catheterization 05/02/1999  . US echocardiography 07/07/2005    EF 55-60%  . Cardiovascular stress test 01/14/2007    EF 41%  . Coronary angioplasty with stent placement     LEFT ANTERIOR DESCENDING ARTERY AND DIAGONAL ARTERY    Family History  Problem Relation Age of Onset  . Coronary artery disease Father   . Heart failure Father     History  Substance Use Topics  . Smoking status: Never Smoker   . Smokeless tobacco: Not on file  . Alcohol Use: Yes     occassionally      Review of Systems  All other systems reviewed and are negative.    Allergies  Actos and Sulfonamide derivatives  Home Medications   Current Outpatient Rx  Name Route Sig Dispense Refill  . ASPIRIN EC 81 MG PO TBEC Oral Take 81 mg by mouth daily.      . BD PEN NEEDLE SHORT U/F 31G X 8 MM MISC Injection  Inject as directed daily.     Marland Kitchen CALCIUM CITRATE-VITAMIN D 315-200 MG-UNIT PO TABS Oral Take 1 tablet by mouth daily.      Marland Kitchen VITAMIN D 1000 UNITS PO TABS Oral Take 1,000 Units by mouth daily.      Marland Kitchen CINNAMON PO Oral Take 1,000 mg by mouth daily.      Marland Kitchen ESCITALOPRAM OXALATE 5 MG PO TABS Oral Take 5 mg by mouth daily.      . OMEGA-3 FATTY ACIDS 1000 MG PO CAPS Oral Take 1 g by mouth daily.      . INSULIN ASPART 100 UNIT/ML [place] SOLN Subcutaneous Inject 11 Units into the skin 3 (three) times daily. Use sliding scale    . INSULIN GLARGINE 100 UNIT/ML [place] SOLN Subcutaneous Inject 35 Units into the skin 2 (two) times daily.     . ISOSORBIDE MONONITRATE ER 60 MG PO TB24  TAKE ONE-HALF TABLET EVERY MORNING 15 tablet 5  . METFORMIN HCL 1000 MG PO TABS Oral Take 1 tablet (1,000 mg total) by mouth 2 (two) times daily with a meal. 60 tablet 11  . ONE-DAILY MULTI VITAMINS PO TABS Oral Take 1 tablet by mouth daily.      Marland Kitchen ROSUVASTATIN CALCIUM 20 MG PO TABS Oral Take 20 mg by mouth daily.      Marland Kitchen  TAMSULOSIN HCL 0.4 MG PO CAPS Oral Take 0.4 mg by mouth daily.      Marland Kitchen VITAMIN B-12 500 MCG PO TABS Oral Take 500 mcg by mouth daily.        BP 142/59  Pulse 56  Temp 98 F (36.7 C) (Oral)  Resp 18  SpO2 100%  Physical Exam General: Well-developed, well-nourished male in no acute distress; appearance consistent with age of record HENT: normocephalic, atraumatic Eyes: pupils equal round and reactive to light; extraocular muscles intact; lens implants Neck: supple Heart: regular rate and rhythm Lungs: clear to auscultation bilaterally Abdomen: soft; nondistended Extremities: Tenderness of the right first metatarsophalangeal joint with decreased range of motion, no warmth or significant edema; right second hammertoe Neurologic: Awake, alert and oriented; motor function intact in all extremities and symmetric; no facial droop Skin: Warm and dry; multiple seborrheic keratoses Psychiatric: Normal mood and  affect    ED Course  Procedures (including critical care time)     MDM  Symptoms suspicious for gout. We'll treat with an anti-inflammatory and have him follow up with his primary care physician.         Hanley Seamen, MD 07/21/12 [phone]

## 2012-07-21 NOTE — ED Notes (Signed)
MD at bedside. 

## 2012-07-22 DIAGNOSIS — Z9181 History of falling: Secondary | ICD-10-CM | POA: Diagnosis not present

## 2012-07-22 DIAGNOSIS — R269 Unspecified abnormalities of gait and mobility: Secondary | ICD-10-CM | POA: Diagnosis not present

## 2012-07-25 DIAGNOSIS — R269 Unspecified abnormalities of gait and mobility: Secondary | ICD-10-CM | POA: Diagnosis not present

## 2012-07-25 DIAGNOSIS — Z9181 History of falling: Secondary | ICD-10-CM | POA: Diagnosis not present

## 2012-07-26 DIAGNOSIS — L608 Other nail disorders: Secondary | ICD-10-CM | POA: Diagnosis not present

## 2012-07-26 DIAGNOSIS — E119 Type 2 diabetes mellitus without complications: Secondary | ICD-10-CM | POA: Diagnosis not present

## 2012-07-26 DIAGNOSIS — L84 Corns and callosities: Secondary | ICD-10-CM | POA: Diagnosis not present

## 2012-07-26 DIAGNOSIS — Q6689 Other  specified congenital deformities of feet: Secondary | ICD-10-CM | POA: Diagnosis not present

## 2012-07-27 DIAGNOSIS — E1159 Type 2 diabetes mellitus with other circulatory complications: Secondary | ICD-10-CM | POA: Diagnosis not present

## 2012-07-27 DIAGNOSIS — I10 Essential (primary) hypertension: Secondary | ICD-10-CM | POA: Diagnosis not present

## 2012-07-27 DIAGNOSIS — I2510 Atherosclerotic heart disease of native coronary artery without angina pectoris: Secondary | ICD-10-CM | POA: Diagnosis not present

## 2012-07-28 DIAGNOSIS — R269 Unspecified abnormalities of gait and mobility: Secondary | ICD-10-CM | POA: Diagnosis not present

## 2012-07-28 DIAGNOSIS — Z9181 History of falling: Secondary | ICD-10-CM | POA: Diagnosis not present

## 2012-08-03 DIAGNOSIS — R269 Unspecified abnormalities of gait and mobility: Secondary | ICD-10-CM | POA: Diagnosis not present

## 2012-08-03 DIAGNOSIS — Z9181 History of falling: Secondary | ICD-10-CM | POA: Diagnosis not present

## 2012-08-05 DIAGNOSIS — R269 Unspecified abnormalities of gait and mobility: Secondary | ICD-10-CM | POA: Diagnosis not present

## 2012-08-05 DIAGNOSIS — C61 Malignant neoplasm of prostate: Secondary | ICD-10-CM | POA: Diagnosis not present

## 2012-08-05 DIAGNOSIS — E291 Testicular hypofunction: Secondary | ICD-10-CM | POA: Diagnosis not present

## 2012-08-05 DIAGNOSIS — Z9181 History of falling: Secondary | ICD-10-CM | POA: Diagnosis not present

## 2012-08-08 DIAGNOSIS — Z9181 History of falling: Secondary | ICD-10-CM | POA: Diagnosis not present

## 2012-08-08 DIAGNOSIS — R269 Unspecified abnormalities of gait and mobility: Secondary | ICD-10-CM | POA: Diagnosis not present

## 2012-08-11 DIAGNOSIS — N401 Enlarged prostate with lower urinary tract symptoms: Secondary | ICD-10-CM | POA: Diagnosis not present

## 2012-08-11 DIAGNOSIS — C61 Malignant neoplasm of prostate: Secondary | ICD-10-CM | POA: Diagnosis not present

## 2012-08-12 DIAGNOSIS — Z9181 History of falling: Secondary | ICD-10-CM | POA: Diagnosis not present

## 2012-08-12 DIAGNOSIS — R269 Unspecified abnormalities of gait and mobility: Secondary | ICD-10-CM | POA: Diagnosis not present

## 2012-08-18 DIAGNOSIS — Z23 Encounter for immunization: Secondary | ICD-10-CM | POA: Diagnosis not present

## 2012-08-19 DIAGNOSIS — R269 Unspecified abnormalities of gait and mobility: Secondary | ICD-10-CM | POA: Diagnosis not present

## 2012-08-19 DIAGNOSIS — Z9181 History of falling: Secondary | ICD-10-CM | POA: Diagnosis not present

## 2012-08-20 DIAGNOSIS — Z9181 History of falling: Secondary | ICD-10-CM | POA: Diagnosis not present

## 2012-08-20 DIAGNOSIS — R269 Unspecified abnormalities of gait and mobility: Secondary | ICD-10-CM | POA: Diagnosis not present

## 2012-08-23 ENCOUNTER — Encounter: Payer: Self-pay | Admitting: Cardiovascular Disease

## 2012-08-23 DIAGNOSIS — Z9181 History of falling: Secondary | ICD-10-CM | POA: Diagnosis not present

## 2012-08-23 DIAGNOSIS — E1159 Type 2 diabetes mellitus with other circulatory complications: Secondary | ICD-10-CM | POA: Diagnosis not present

## 2012-08-23 DIAGNOSIS — I10 Essential (primary) hypertension: Secondary | ICD-10-CM | POA: Diagnosis not present

## 2012-08-23 DIAGNOSIS — Z125 Encounter for screening for malignant neoplasm of prostate: Secondary | ICD-10-CM | POA: Diagnosis not present

## 2012-08-23 DIAGNOSIS — D539 Nutritional anemia, unspecified: Secondary | ICD-10-CM | POA: Diagnosis not present

## 2012-08-23 DIAGNOSIS — R413 Other amnesia: Secondary | ICD-10-CM | POA: Diagnosis not present

## 2012-08-23 DIAGNOSIS — R269 Unspecified abnormalities of gait and mobility: Secondary | ICD-10-CM | POA: Diagnosis not present

## 2012-08-23 DIAGNOSIS — M810 Age-related osteoporosis without current pathological fracture: Secondary | ICD-10-CM | POA: Diagnosis not present

## 2012-08-23 DIAGNOSIS — E785 Hyperlipidemia, unspecified: Secondary | ICD-10-CM | POA: Diagnosis not present

## 2012-08-24 DIAGNOSIS — E1159 Type 2 diabetes mellitus with other circulatory complications: Secondary | ICD-10-CM | POA: Diagnosis not present

## 2012-08-25 DIAGNOSIS — Z9181 History of falling: Secondary | ICD-10-CM | POA: Diagnosis not present

## 2012-08-25 DIAGNOSIS — R269 Unspecified abnormalities of gait and mobility: Secondary | ICD-10-CM | POA: Diagnosis not present

## 2012-08-26 ENCOUNTER — Other Ambulatory Visit: Payer: Self-pay | Admitting: Internal Medicine

## 2012-08-26 DIAGNOSIS — I2510 Atherosclerotic heart disease of native coronary artery without angina pectoris: Secondary | ICD-10-CM | POA: Diagnosis not present

## 2012-08-26 DIAGNOSIS — R413 Other amnesia: Secondary | ICD-10-CM | POA: Diagnosis not present

## 2012-08-26 DIAGNOSIS — I10 Essential (primary) hypertension: Secondary | ICD-10-CM | POA: Diagnosis not present

## 2012-08-26 DIAGNOSIS — E1159 Type 2 diabetes mellitus with other circulatory complications: Secondary | ICD-10-CM | POA: Diagnosis not present

## 2012-08-30 DIAGNOSIS — E1159 Type 2 diabetes mellitus with other circulatory complications: Secondary | ICD-10-CM | POA: Diagnosis not present

## 2012-08-30 DIAGNOSIS — I10 Essential (primary) hypertension: Secondary | ICD-10-CM | POA: Diagnosis not present

## 2012-08-30 DIAGNOSIS — Z0000 Encounter for general adult medical examination without abnormal findings: Secondary | ICD-10-CM | POA: Diagnosis not present

## 2012-08-30 DIAGNOSIS — R269 Unspecified abnormalities of gait and mobility: Secondary | ICD-10-CM | POA: Diagnosis not present

## 2012-08-30 DIAGNOSIS — Z1331 Encounter for screening for depression: Secondary | ICD-10-CM | POA: Diagnosis not present

## 2012-08-30 DIAGNOSIS — I2510 Atherosclerotic heart disease of native coronary artery without angina pectoris: Secondary | ICD-10-CM | POA: Diagnosis not present

## 2012-08-30 DIAGNOSIS — Z9181 History of falling: Secondary | ICD-10-CM | POA: Diagnosis not present

## 2012-08-31 DIAGNOSIS — Z9181 History of falling: Secondary | ICD-10-CM | POA: Diagnosis not present

## 2012-08-31 DIAGNOSIS — R269 Unspecified abnormalities of gait and mobility: Secondary | ICD-10-CM | POA: Diagnosis not present

## 2012-08-31 DIAGNOSIS — IMO0002 Reserved for concepts with insufficient information to code with codable children: Secondary | ICD-10-CM | POA: Diagnosis not present

## 2012-09-02 ENCOUNTER — Other Ambulatory Visit: Payer: Medicare Other

## 2012-09-08 DIAGNOSIS — R269 Unspecified abnormalities of gait and mobility: Secondary | ICD-10-CM | POA: Diagnosis not present

## 2012-09-08 DIAGNOSIS — Z9181 History of falling: Secondary | ICD-10-CM | POA: Diagnosis not present

## 2012-09-12 DIAGNOSIS — R269 Unspecified abnormalities of gait and mobility: Secondary | ICD-10-CM | POA: Diagnosis not present

## 2012-09-12 DIAGNOSIS — Z9181 History of falling: Secondary | ICD-10-CM | POA: Diagnosis not present

## 2012-09-16 DIAGNOSIS — Z9181 History of falling: Secondary | ICD-10-CM | POA: Diagnosis not present

## 2012-09-16 DIAGNOSIS — R269 Unspecified abnormalities of gait and mobility: Secondary | ICD-10-CM | POA: Diagnosis not present

## 2012-09-19 DIAGNOSIS — Z9181 History of falling: Secondary | ICD-10-CM | POA: Diagnosis not present

## 2012-09-19 DIAGNOSIS — R269 Unspecified abnormalities of gait and mobility: Secondary | ICD-10-CM | POA: Diagnosis not present

## 2012-09-21 DIAGNOSIS — Z9181 History of falling: Secondary | ICD-10-CM | POA: Diagnosis not present

## 2012-09-21 DIAGNOSIS — R269 Unspecified abnormalities of gait and mobility: Secondary | ICD-10-CM | POA: Diagnosis not present

## 2012-09-22 DIAGNOSIS — I10 Essential (primary) hypertension: Secondary | ICD-10-CM | POA: Diagnosis not present

## 2012-09-22 DIAGNOSIS — R413 Other amnesia: Secondary | ICD-10-CM | POA: Diagnosis not present

## 2012-09-22 DIAGNOSIS — E1159 Type 2 diabetes mellitus with other circulatory complications: Secondary | ICD-10-CM | POA: Diagnosis not present

## 2012-09-27 DIAGNOSIS — L608 Other nail disorders: Secondary | ICD-10-CM | POA: Diagnosis not present

## 2012-09-27 DIAGNOSIS — R269 Unspecified abnormalities of gait and mobility: Secondary | ICD-10-CM | POA: Diagnosis not present

## 2012-09-27 DIAGNOSIS — L84 Corns and callosities: Secondary | ICD-10-CM | POA: Diagnosis not present

## 2012-09-27 DIAGNOSIS — E119 Type 2 diabetes mellitus without complications: Secondary | ICD-10-CM | POA: Diagnosis not present

## 2012-09-27 DIAGNOSIS — Z9181 History of falling: Secondary | ICD-10-CM | POA: Diagnosis not present

## 2012-09-29 DIAGNOSIS — Z9181 History of falling: Secondary | ICD-10-CM | POA: Diagnosis not present

## 2012-09-29 DIAGNOSIS — R269 Unspecified abnormalities of gait and mobility: Secondary | ICD-10-CM | POA: Diagnosis not present

## 2012-10-04 DIAGNOSIS — R269 Unspecified abnormalities of gait and mobility: Secondary | ICD-10-CM | POA: Diagnosis not present

## 2012-10-04 DIAGNOSIS — Z9181 History of falling: Secondary | ICD-10-CM | POA: Diagnosis not present

## 2012-10-06 DIAGNOSIS — Z9181 History of falling: Secondary | ICD-10-CM | POA: Diagnosis not present

## 2012-10-06 DIAGNOSIS — R269 Unspecified abnormalities of gait and mobility: Secondary | ICD-10-CM | POA: Diagnosis not present

## 2012-10-11 DIAGNOSIS — R269 Unspecified abnormalities of gait and mobility: Secondary | ICD-10-CM | POA: Diagnosis not present

## 2012-10-11 DIAGNOSIS — Z9181 History of falling: Secondary | ICD-10-CM | POA: Diagnosis not present

## 2012-10-13 DIAGNOSIS — R269 Unspecified abnormalities of gait and mobility: Secondary | ICD-10-CM | POA: Diagnosis not present

## 2012-10-13 DIAGNOSIS — Z9181 History of falling: Secondary | ICD-10-CM | POA: Diagnosis not present

## 2012-10-18 DIAGNOSIS — R269 Unspecified abnormalities of gait and mobility: Secondary | ICD-10-CM | POA: Diagnosis not present

## 2012-10-18 DIAGNOSIS — Z9181 History of falling: Secondary | ICD-10-CM | POA: Diagnosis not present

## 2012-10-26 DIAGNOSIS — R413 Other amnesia: Secondary | ICD-10-CM | POA: Diagnosis not present

## 2012-10-26 DIAGNOSIS — E1159 Type 2 diabetes mellitus with other circulatory complications: Secondary | ICD-10-CM | POA: Diagnosis not present

## 2012-10-26 DIAGNOSIS — I10 Essential (primary) hypertension: Secondary | ICD-10-CM | POA: Diagnosis not present

## 2012-12-01 DIAGNOSIS — L821 Other seborrheic keratosis: Secondary | ICD-10-CM | POA: Diagnosis not present

## 2012-12-01 DIAGNOSIS — L570 Actinic keratosis: Secondary | ICD-10-CM | POA: Diagnosis not present

## 2012-12-06 DIAGNOSIS — E119 Type 2 diabetes mellitus without complications: Secondary | ICD-10-CM | POA: Diagnosis not present

## 2012-12-06 DIAGNOSIS — L608 Other nail disorders: Secondary | ICD-10-CM | POA: Diagnosis not present

## 2012-12-13 DIAGNOSIS — R413 Other amnesia: Secondary | ICD-10-CM | POA: Diagnosis not present

## 2012-12-13 DIAGNOSIS — E1159 Type 2 diabetes mellitus with other circulatory complications: Secondary | ICD-10-CM | POA: Diagnosis not present

## 2012-12-13 DIAGNOSIS — I10 Essential (primary) hypertension: Secondary | ICD-10-CM | POA: Diagnosis not present

## 2012-12-21 DIAGNOSIS — Z7901 Long term (current) use of anticoagulants: Secondary | ICD-10-CM | POA: Diagnosis not present

## 2012-12-21 DIAGNOSIS — Z833 Family history of diabetes mellitus: Secondary | ICD-10-CM | POA: Diagnosis not present

## 2012-12-21 DIAGNOSIS — Z961 Presence of intraocular lens: Secondary | ICD-10-CM | POA: Diagnosis not present

## 2012-12-21 DIAGNOSIS — S0520XA Ocular laceration and rupture with prolapse or loss of intraocular tissue, unspecified eye, initial encounter: Secondary | ICD-10-CM | POA: Diagnosis not present

## 2012-12-21 DIAGNOSIS — Z9861 Coronary angioplasty status: Secondary | ICD-10-CM | POA: Diagnosis not present

## 2012-12-21 DIAGNOSIS — E119 Type 2 diabetes mellitus without complications: Secondary | ICD-10-CM | POA: Diagnosis not present

## 2012-12-21 DIAGNOSIS — Z882 Allergy status to sulfonamides status: Secondary | ICD-10-CM | POA: Diagnosis not present

## 2012-12-21 DIAGNOSIS — Z9889 Other specified postprocedural states: Secondary | ICD-10-CM | POA: Diagnosis not present

## 2012-12-21 DIAGNOSIS — S0530XA Ocular laceration without prolapse or loss of intraocular tissue, unspecified eye, initial encounter: Secondary | ICD-10-CM | POA: Diagnosis not present

## 2012-12-21 DIAGNOSIS — H33029 Retinal detachment with multiple breaks, unspecified eye: Secondary | ICD-10-CM | POA: Diagnosis not present

## 2012-12-21 DIAGNOSIS — Z794 Long term (current) use of insulin: Secondary | ICD-10-CM | POA: Diagnosis not present

## 2012-12-21 DIAGNOSIS — Z9849 Cataract extraction status, unspecified eye: Secondary | ICD-10-CM | POA: Diagnosis not present

## 2012-12-21 DIAGNOSIS — Z7982 Long term (current) use of aspirin: Secondary | ICD-10-CM | POA: Diagnosis not present

## 2012-12-21 DIAGNOSIS — I10 Essential (primary) hypertension: Secondary | ICD-10-CM | POA: Diagnosis not present

## 2012-12-21 DIAGNOSIS — I2510 Atherosclerotic heart disease of native coronary artery without angina pectoris: Secondary | ICD-10-CM | POA: Diagnosis not present

## 2012-12-21 DIAGNOSIS — Z87891 Personal history of nicotine dependence: Secondary | ICD-10-CM | POA: Diagnosis not present

## 2012-12-21 DIAGNOSIS — Z8546 Personal history of malignant neoplasm of prostate: Secondary | ICD-10-CM | POA: Diagnosis not present

## 2012-12-30 DIAGNOSIS — S0530XA Ocular laceration without prolapse or loss of intraocular tissue, unspecified eye, initial encounter: Secondary | ICD-10-CM | POA: Diagnosis not present

## 2013-01-03 DIAGNOSIS — S0530XA Ocular laceration without prolapse or loss of intraocular tissue, unspecified eye, initial encounter: Secondary | ICD-10-CM | POA: Diagnosis not present

## 2013-01-03 DIAGNOSIS — Z9889 Other specified postprocedural states: Secondary | ICD-10-CM | POA: Diagnosis not present

## 2013-01-03 DIAGNOSIS — H31409 Unspecified choroidal detachment, unspecified eye: Secondary | ICD-10-CM | POA: Diagnosis not present

## 2013-01-03 DIAGNOSIS — S0520XA Ocular laceration and rupture with prolapse or loss of intraocular tissue, unspecified eye, initial encounter: Secondary | ICD-10-CM | POA: Diagnosis not present

## 2013-01-09 DIAGNOSIS — Z881 Allergy status to other antibiotic agents status: Secondary | ICD-10-CM | POA: Diagnosis not present

## 2013-01-09 DIAGNOSIS — I2510 Atherosclerotic heart disease of native coronary artery without angina pectoris: Secondary | ICD-10-CM | POA: Diagnosis not present

## 2013-01-09 DIAGNOSIS — E119 Type 2 diabetes mellitus without complications: Secondary | ICD-10-CM | POA: Diagnosis not present

## 2013-01-09 DIAGNOSIS — Z7982 Long term (current) use of aspirin: Secondary | ICD-10-CM | POA: Diagnosis not present

## 2013-01-09 DIAGNOSIS — S0530XA Ocular laceration without prolapse or loss of intraocular tissue, unspecified eye, initial encounter: Secondary | ICD-10-CM | POA: Diagnosis not present

## 2013-01-09 DIAGNOSIS — H31409 Unspecified choroidal detachment, unspecified eye: Secondary | ICD-10-CM | POA: Diagnosis not present

## 2013-01-09 DIAGNOSIS — Z794 Long term (current) use of insulin: Secondary | ICD-10-CM | POA: Diagnosis not present

## 2013-01-09 DIAGNOSIS — Z7902 Long term (current) use of antithrombotics/antiplatelets: Secondary | ICD-10-CM | POA: Diagnosis not present

## 2013-01-09 DIAGNOSIS — H33009 Unspecified retinal detachment with retinal break, unspecified eye: Secondary | ICD-10-CM | POA: Diagnosis not present

## 2013-01-09 DIAGNOSIS — H338 Other retinal detachments: Secondary | ICD-10-CM | POA: Diagnosis not present

## 2013-01-10 DIAGNOSIS — Z794 Long term (current) use of insulin: Secondary | ICD-10-CM | POA: Diagnosis not present

## 2013-01-10 DIAGNOSIS — I2510 Atherosclerotic heart disease of native coronary artery without angina pectoris: Secondary | ICD-10-CM | POA: Diagnosis not present

## 2013-01-10 DIAGNOSIS — H31409 Unspecified choroidal detachment, unspecified eye: Secondary | ICD-10-CM | POA: Diagnosis not present

## 2013-01-10 DIAGNOSIS — H33009 Unspecified retinal detachment with retinal break, unspecified eye: Secondary | ICD-10-CM | POA: Diagnosis not present

## 2013-01-10 DIAGNOSIS — Z7902 Long term (current) use of antithrombotics/antiplatelets: Secondary | ICD-10-CM | POA: Diagnosis not present

## 2013-01-10 DIAGNOSIS — E119 Type 2 diabetes mellitus without complications: Secondary | ICD-10-CM | POA: Diagnosis not present

## 2013-02-03 DIAGNOSIS — Z9181 History of falling: Secondary | ICD-10-CM | POA: Diagnosis not present

## 2013-02-03 DIAGNOSIS — Z48810 Encounter for surgical aftercare following surgery on the sense organs: Secondary | ICD-10-CM | POA: Diagnosis not present

## 2013-02-03 DIAGNOSIS — R269 Unspecified abnormalities of gait and mobility: Secondary | ICD-10-CM | POA: Diagnosis not present

## 2013-02-06 DIAGNOSIS — R269 Unspecified abnormalities of gait and mobility: Secondary | ICD-10-CM | POA: Diagnosis not present

## 2013-02-06 DIAGNOSIS — Z48810 Encounter for surgical aftercare following surgery on the sense organs: Secondary | ICD-10-CM | POA: Diagnosis not present

## 2013-02-06 DIAGNOSIS — Z9181 History of falling: Secondary | ICD-10-CM | POA: Diagnosis not present

## 2013-02-06 DIAGNOSIS — C61 Malignant neoplasm of prostate: Secondary | ICD-10-CM | POA: Diagnosis not present

## 2013-02-08 DIAGNOSIS — R269 Unspecified abnormalities of gait and mobility: Secondary | ICD-10-CM | POA: Diagnosis not present

## 2013-02-08 DIAGNOSIS — Z48810 Encounter for surgical aftercare following surgery on the sense organs: Secondary | ICD-10-CM | POA: Diagnosis not present

## 2013-02-08 DIAGNOSIS — Z9181 History of falling: Secondary | ICD-10-CM | POA: Diagnosis not present

## 2013-02-10 DIAGNOSIS — Z48810 Encounter for surgical aftercare following surgery on the sense organs: Secondary | ICD-10-CM | POA: Diagnosis not present

## 2013-02-10 DIAGNOSIS — R269 Unspecified abnormalities of gait and mobility: Secondary | ICD-10-CM | POA: Diagnosis not present

## 2013-02-10 DIAGNOSIS — Z9181 History of falling: Secondary | ICD-10-CM | POA: Diagnosis not present

## 2013-02-13 DIAGNOSIS — R269 Unspecified abnormalities of gait and mobility: Secondary | ICD-10-CM | POA: Diagnosis not present

## 2013-02-13 DIAGNOSIS — E291 Testicular hypofunction: Secondary | ICD-10-CM | POA: Diagnosis not present

## 2013-02-13 DIAGNOSIS — C61 Malignant neoplasm of prostate: Secondary | ICD-10-CM | POA: Diagnosis not present

## 2013-02-13 DIAGNOSIS — Z48810 Encounter for surgical aftercare following surgery on the sense organs: Secondary | ICD-10-CM | POA: Diagnosis not present

## 2013-02-13 DIAGNOSIS — Z9181 History of falling: Secondary | ICD-10-CM | POA: Diagnosis not present

## 2013-02-14 DIAGNOSIS — Z9181 History of falling: Secondary | ICD-10-CM | POA: Diagnosis not present

## 2013-02-14 DIAGNOSIS — Z48810 Encounter for surgical aftercare following surgery on the sense organs: Secondary | ICD-10-CM | POA: Diagnosis not present

## 2013-02-14 DIAGNOSIS — R269 Unspecified abnormalities of gait and mobility: Secondary | ICD-10-CM | POA: Diagnosis not present

## 2013-02-14 DIAGNOSIS — R5381 Other malaise: Secondary | ICD-10-CM | POA: Diagnosis not present

## 2013-02-15 DIAGNOSIS — Z9181 History of falling: Secondary | ICD-10-CM | POA: Diagnosis not present

## 2013-02-15 DIAGNOSIS — R269 Unspecified abnormalities of gait and mobility: Secondary | ICD-10-CM | POA: Diagnosis not present

## 2013-02-15 DIAGNOSIS — Z48810 Encounter for surgical aftercare following surgery on the sense organs: Secondary | ICD-10-CM | POA: Diagnosis not present

## 2013-02-16 DIAGNOSIS — R269 Unspecified abnormalities of gait and mobility: Secondary | ICD-10-CM | POA: Diagnosis not present

## 2013-02-16 DIAGNOSIS — Z48810 Encounter for surgical aftercare following surgery on the sense organs: Secondary | ICD-10-CM | POA: Diagnosis not present

## 2013-02-16 DIAGNOSIS — Z9181 History of falling: Secondary | ICD-10-CM | POA: Diagnosis not present

## 2013-02-17 DIAGNOSIS — R269 Unspecified abnormalities of gait and mobility: Secondary | ICD-10-CM | POA: Diagnosis not present

## 2013-02-17 DIAGNOSIS — Z48810 Encounter for surgical aftercare following surgery on the sense organs: Secondary | ICD-10-CM | POA: Diagnosis not present

## 2013-02-17 DIAGNOSIS — Z9181 History of falling: Secondary | ICD-10-CM | POA: Diagnosis not present

## 2013-02-20 DIAGNOSIS — Z9181 History of falling: Secondary | ICD-10-CM | POA: Diagnosis not present

## 2013-02-20 DIAGNOSIS — R269 Unspecified abnormalities of gait and mobility: Secondary | ICD-10-CM | POA: Diagnosis not present

## 2013-02-20 DIAGNOSIS — Z48810 Encounter for surgical aftercare following surgery on the sense organs: Secondary | ICD-10-CM | POA: Diagnosis not present

## 2013-02-21 DIAGNOSIS — Z9181 History of falling: Secondary | ICD-10-CM | POA: Diagnosis not present

## 2013-02-21 DIAGNOSIS — H5450 Low vision, one eye, unspecified eye: Secondary | ICD-10-CM | POA: Diagnosis not present

## 2013-02-21 DIAGNOSIS — R269 Unspecified abnormalities of gait and mobility: Secondary | ICD-10-CM | POA: Diagnosis not present

## 2013-02-21 DIAGNOSIS — Z48810 Encounter for surgical aftercare following surgery on the sense organs: Secondary | ICD-10-CM | POA: Diagnosis not present

## 2013-02-22 DIAGNOSIS — Z48810 Encounter for surgical aftercare following surgery on the sense organs: Secondary | ICD-10-CM | POA: Diagnosis not present

## 2013-02-22 DIAGNOSIS — Z9181 History of falling: Secondary | ICD-10-CM | POA: Diagnosis not present

## 2013-02-22 DIAGNOSIS — H5450 Low vision, one eye, unspecified eye: Secondary | ICD-10-CM | POA: Diagnosis not present

## 2013-02-22 DIAGNOSIS — R269 Unspecified abnormalities of gait and mobility: Secondary | ICD-10-CM | POA: Diagnosis not present

## 2013-02-23 DIAGNOSIS — Z48810 Encounter for surgical aftercare following surgery on the sense organs: Secondary | ICD-10-CM | POA: Diagnosis not present

## 2013-02-23 DIAGNOSIS — Z9181 History of falling: Secondary | ICD-10-CM | POA: Diagnosis not present

## 2013-02-23 DIAGNOSIS — R269 Unspecified abnormalities of gait and mobility: Secondary | ICD-10-CM | POA: Diagnosis not present

## 2013-02-23 DIAGNOSIS — H5450 Low vision, one eye, unspecified eye: Secondary | ICD-10-CM | POA: Diagnosis not present

## 2013-02-24 DIAGNOSIS — H5450 Low vision, one eye, unspecified eye: Secondary | ICD-10-CM | POA: Diagnosis not present

## 2013-02-24 DIAGNOSIS — Z48810 Encounter for surgical aftercare following surgery on the sense organs: Secondary | ICD-10-CM | POA: Diagnosis not present

## 2013-02-24 DIAGNOSIS — R269 Unspecified abnormalities of gait and mobility: Secondary | ICD-10-CM | POA: Diagnosis not present

## 2013-02-24 DIAGNOSIS — Z9181 History of falling: Secondary | ICD-10-CM | POA: Diagnosis not present

## 2013-02-27 DIAGNOSIS — Z9181 History of falling: Secondary | ICD-10-CM | POA: Diagnosis not present

## 2013-02-27 DIAGNOSIS — R269 Unspecified abnormalities of gait and mobility: Secondary | ICD-10-CM | POA: Diagnosis not present

## 2013-02-27 DIAGNOSIS — H5450 Low vision, one eye, unspecified eye: Secondary | ICD-10-CM | POA: Diagnosis not present

## 2013-02-27 DIAGNOSIS — Z48810 Encounter for surgical aftercare following surgery on the sense organs: Secondary | ICD-10-CM | POA: Diagnosis not present

## 2013-02-28 DIAGNOSIS — Z48810 Encounter for surgical aftercare following surgery on the sense organs: Secondary | ICD-10-CM | POA: Diagnosis not present

## 2013-02-28 DIAGNOSIS — E039 Hypothyroidism, unspecified: Secondary | ICD-10-CM | POA: Diagnosis not present

## 2013-02-28 DIAGNOSIS — Z9181 History of falling: Secondary | ICD-10-CM | POA: Diagnosis not present

## 2013-02-28 DIAGNOSIS — R269 Unspecified abnormalities of gait and mobility: Secondary | ICD-10-CM | POA: Diagnosis not present

## 2013-02-28 DIAGNOSIS — H5450 Low vision, one eye, unspecified eye: Secondary | ICD-10-CM | POA: Diagnosis not present

## 2013-03-01 DIAGNOSIS — R269 Unspecified abnormalities of gait and mobility: Secondary | ICD-10-CM | POA: Diagnosis not present

## 2013-03-01 DIAGNOSIS — H5450 Low vision, one eye, unspecified eye: Secondary | ICD-10-CM | POA: Diagnosis not present

## 2013-03-01 DIAGNOSIS — Z9181 History of falling: Secondary | ICD-10-CM | POA: Diagnosis not present

## 2013-03-01 DIAGNOSIS — Z48810 Encounter for surgical aftercare following surgery on the sense organs: Secondary | ICD-10-CM | POA: Diagnosis not present

## 2013-03-01 IMAGING — CR DG SHOULDER 2+V*L*
3 series · 3 of 3 positions shown · non-contrast
Comparison: None.

CLINICAL DATA: Fell today with left lower chest and shoulder pain

LEFT SHOULDER - 2+ VIEW

[w shoulder ap internal left]
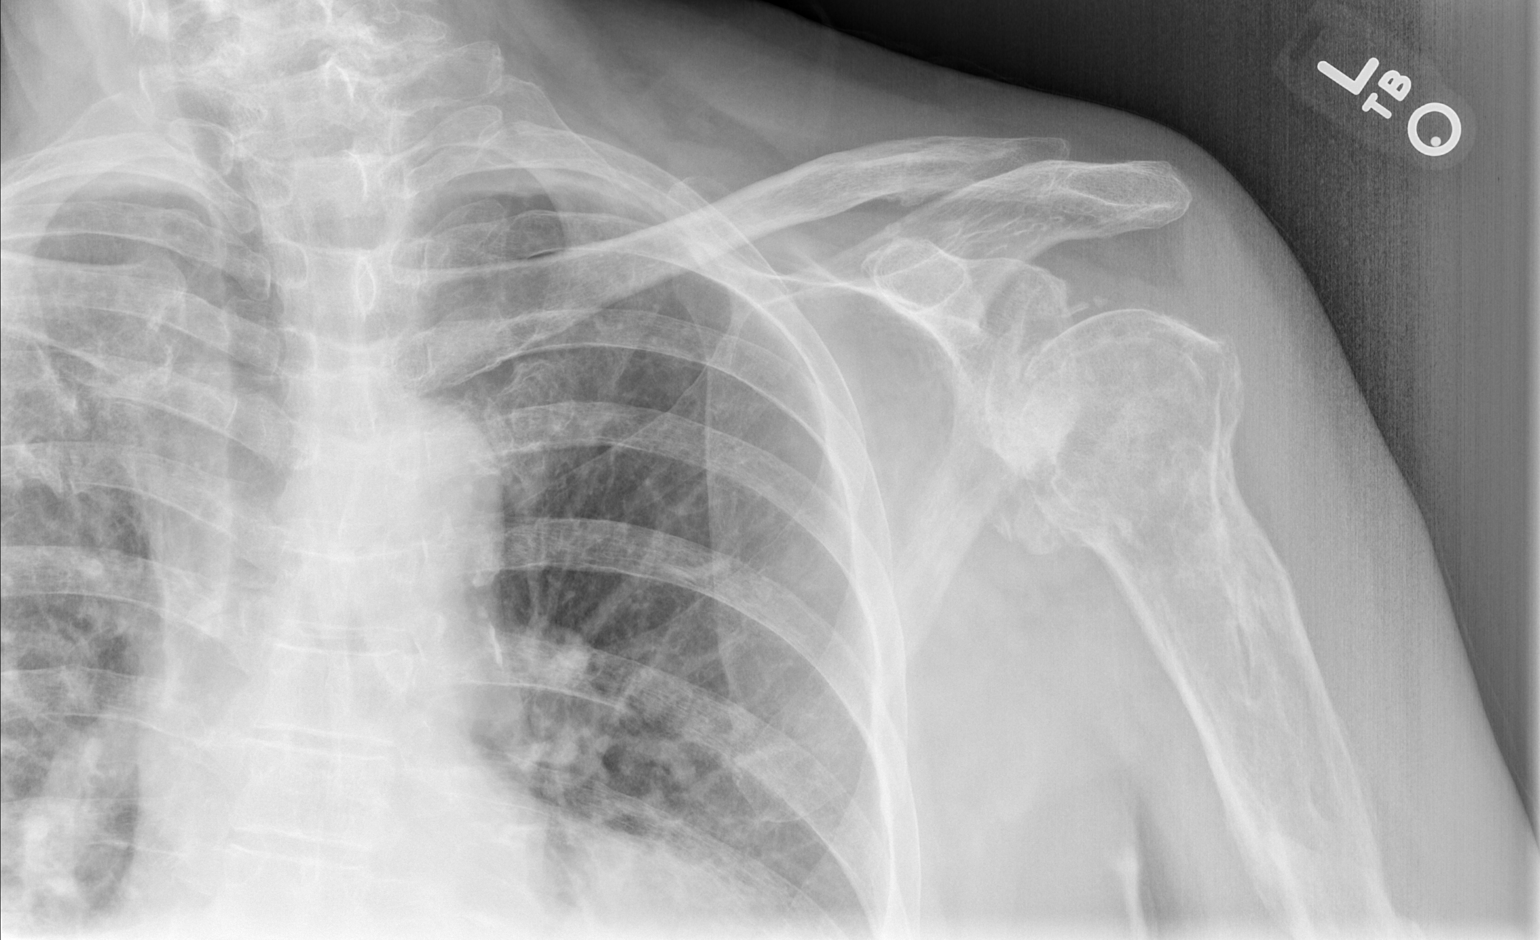

[w shoulder ap external left]
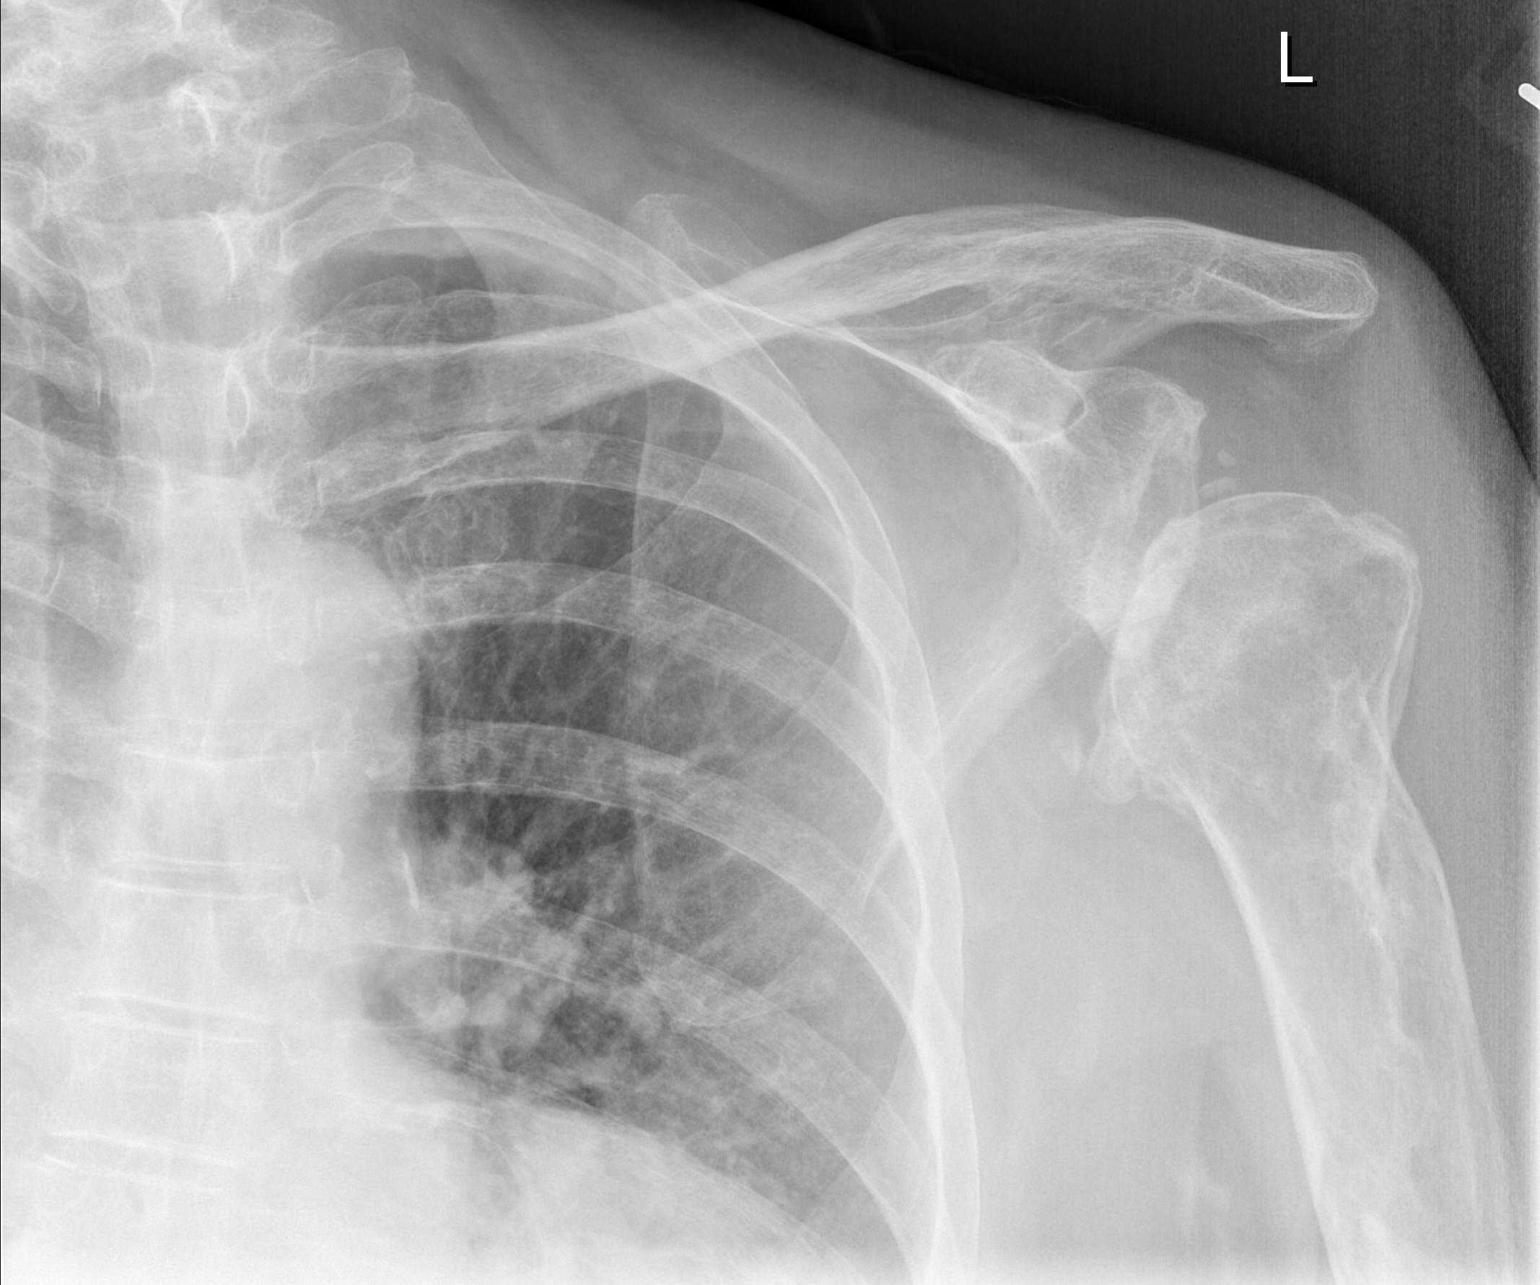

[w shoulder y view left]
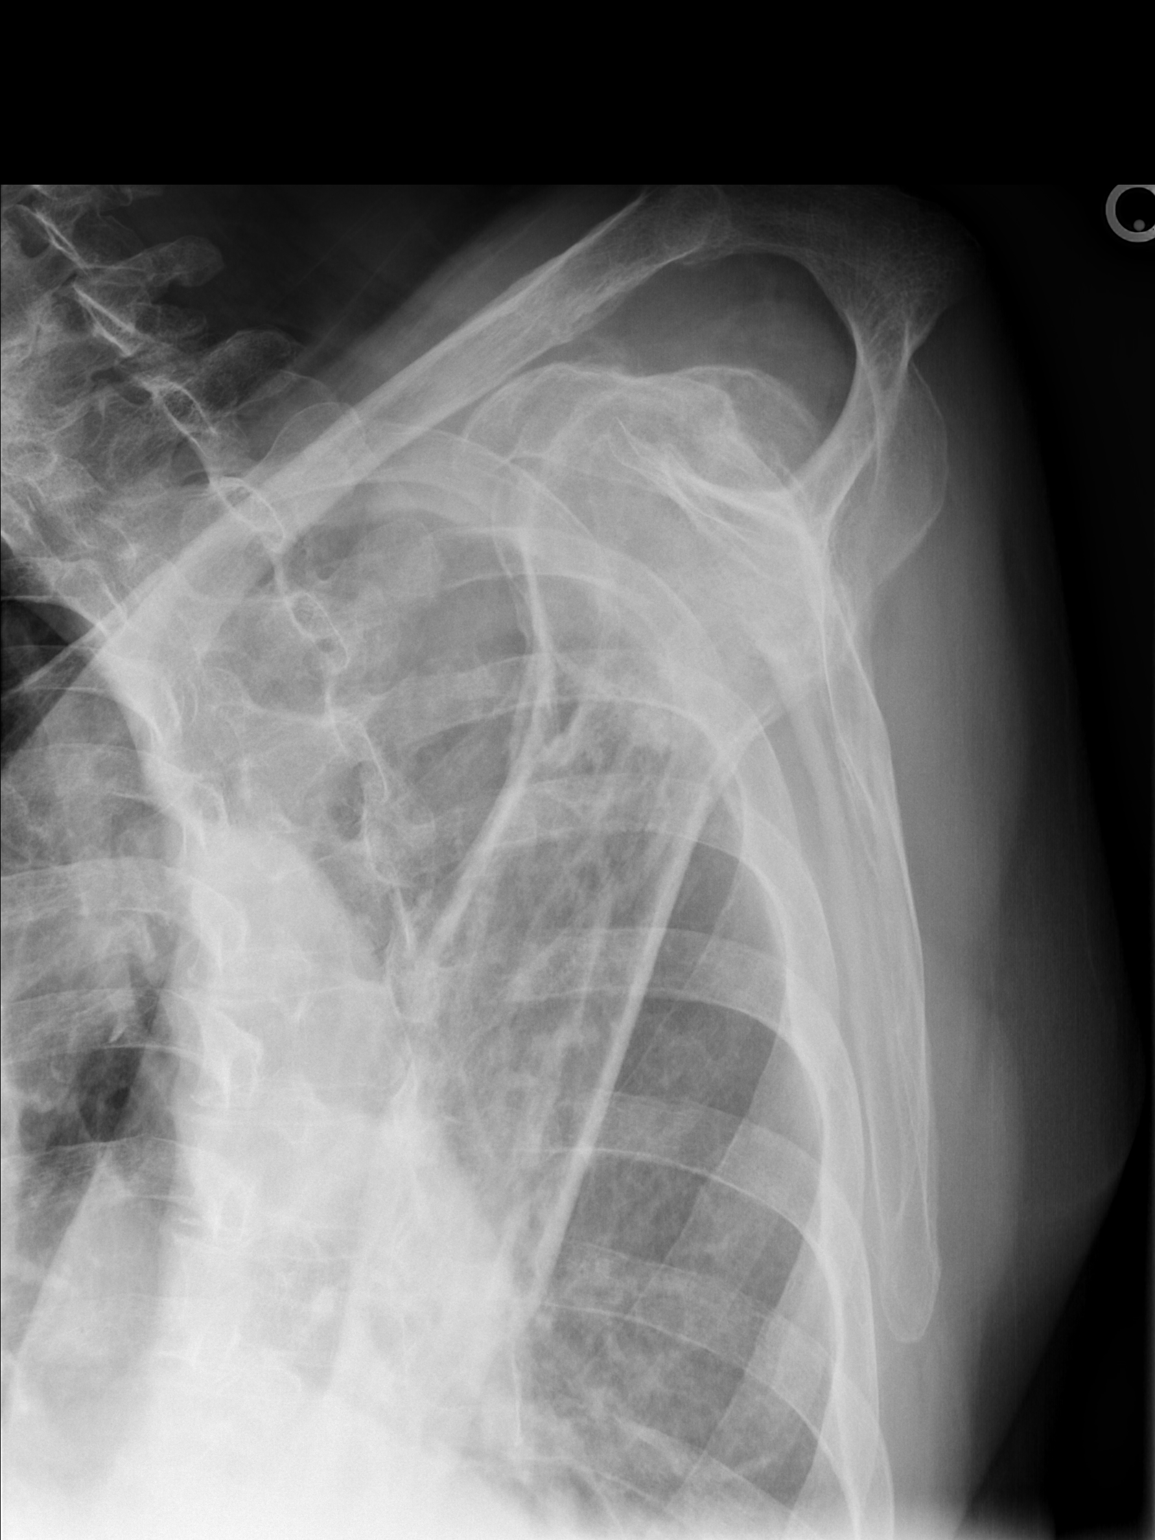

[3 of 3 positions shown; findings below may reference images not displayed]

FINDINGS: There is subluxation of the left humeral head which may
be chronic. Some deformity of the proximal left humerus may be due
to prior fracture with healing.  There is significant degenerative
joint disease of the left shoulder with loss of joint space,
sclerosis, and spurring.  The left AC joint is normally aligned.
The bones are osteopenic.
IMPRESSION: 1.  Subluxation of the left humeral head may be due to prior trauma
and subsequent degenerative change.
2.  Significant degenerative joint disease of the left shoulder.
Osteopenia.

## 2013-03-02 DIAGNOSIS — Z9181 History of falling: Secondary | ICD-10-CM | POA: Diagnosis not present

## 2013-03-02 DIAGNOSIS — H5450 Low vision, one eye, unspecified eye: Secondary | ICD-10-CM | POA: Diagnosis not present

## 2013-03-02 DIAGNOSIS — R269 Unspecified abnormalities of gait and mobility: Secondary | ICD-10-CM | POA: Diagnosis not present

## 2013-03-02 DIAGNOSIS — Z48810 Encounter for surgical aftercare following surgery on the sense organs: Secondary | ICD-10-CM | POA: Diagnosis not present

## 2013-03-03 DIAGNOSIS — Z9181 History of falling: Secondary | ICD-10-CM | POA: Diagnosis not present

## 2013-03-03 DIAGNOSIS — R269 Unspecified abnormalities of gait and mobility: Secondary | ICD-10-CM | POA: Diagnosis not present

## 2013-03-03 DIAGNOSIS — H5450 Low vision, one eye, unspecified eye: Secondary | ICD-10-CM | POA: Diagnosis not present

## 2013-03-03 DIAGNOSIS — Z48810 Encounter for surgical aftercare following surgery on the sense organs: Secondary | ICD-10-CM | POA: Diagnosis not present

## 2013-03-09 DIAGNOSIS — Z9181 History of falling: Secondary | ICD-10-CM | POA: Diagnosis not present

## 2013-03-09 DIAGNOSIS — I10 Essential (primary) hypertension: Secondary | ICD-10-CM | POA: Diagnosis not present

## 2013-03-09 DIAGNOSIS — I2510 Atherosclerotic heart disease of native coronary artery without angina pectoris: Secondary | ICD-10-CM | POA: Diagnosis not present

## 2013-03-09 DIAGNOSIS — E1159 Type 2 diabetes mellitus with other circulatory complications: Secondary | ICD-10-CM | POA: Diagnosis not present

## 2013-03-09 DIAGNOSIS — R269 Unspecified abnormalities of gait and mobility: Secondary | ICD-10-CM | POA: Diagnosis not present

## 2013-03-09 DIAGNOSIS — Z48810 Encounter for surgical aftercare following surgery on the sense organs: Secondary | ICD-10-CM | POA: Diagnosis not present

## 2013-03-09 DIAGNOSIS — H5450 Low vision, one eye, unspecified eye: Secondary | ICD-10-CM | POA: Diagnosis not present

## 2013-03-10 DIAGNOSIS — Z48810 Encounter for surgical aftercare following surgery on the sense organs: Secondary | ICD-10-CM | POA: Diagnosis not present

## 2013-03-10 DIAGNOSIS — R269 Unspecified abnormalities of gait and mobility: Secondary | ICD-10-CM | POA: Diagnosis not present

## 2013-03-10 DIAGNOSIS — Z9181 History of falling: Secondary | ICD-10-CM | POA: Diagnosis not present

## 2013-03-10 DIAGNOSIS — H5450 Low vision, one eye, unspecified eye: Secondary | ICD-10-CM | POA: Diagnosis not present

## 2013-03-14 DIAGNOSIS — Z9181 History of falling: Secondary | ICD-10-CM | POA: Diagnosis not present

## 2013-03-14 DIAGNOSIS — H5450 Low vision, one eye, unspecified eye: Secondary | ICD-10-CM | POA: Diagnosis not present

## 2013-03-14 DIAGNOSIS — R269 Unspecified abnormalities of gait and mobility: Secondary | ICD-10-CM | POA: Diagnosis not present

## 2013-03-14 DIAGNOSIS — Z48810 Encounter for surgical aftercare following surgery on the sense organs: Secondary | ICD-10-CM | POA: Diagnosis not present

## 2013-03-16 DIAGNOSIS — R269 Unspecified abnormalities of gait and mobility: Secondary | ICD-10-CM | POA: Diagnosis not present

## 2013-03-16 DIAGNOSIS — Z9181 History of falling: Secondary | ICD-10-CM | POA: Diagnosis not present

## 2013-03-16 DIAGNOSIS — H5450 Low vision, one eye, unspecified eye: Secondary | ICD-10-CM | POA: Diagnosis not present

## 2013-03-16 DIAGNOSIS — Z48810 Encounter for surgical aftercare following surgery on the sense organs: Secondary | ICD-10-CM | POA: Diagnosis not present

## 2013-03-28 DIAGNOSIS — L608 Other nail disorders: Secondary | ICD-10-CM | POA: Diagnosis not present

## 2013-03-28 DIAGNOSIS — E119 Type 2 diabetes mellitus without complications: Secondary | ICD-10-CM | POA: Diagnosis not present

## 2013-05-30 DIAGNOSIS — L608 Other nail disorders: Secondary | ICD-10-CM | POA: Diagnosis not present

## 2013-05-30 DIAGNOSIS — E119 Type 2 diabetes mellitus without complications: Secondary | ICD-10-CM | POA: Diagnosis not present

## 2013-06-08 DIAGNOSIS — E1159 Type 2 diabetes mellitus with other circulatory complications: Secondary | ICD-10-CM | POA: Diagnosis not present

## 2013-06-08 DIAGNOSIS — I10 Essential (primary) hypertension: Secondary | ICD-10-CM | POA: Diagnosis not present

## 2013-06-21 DIAGNOSIS — L259 Unspecified contact dermatitis, unspecified cause: Secondary | ICD-10-CM | POA: Diagnosis not present

## 2013-06-21 DIAGNOSIS — D485 Neoplasm of uncertain behavior of skin: Secondary | ICD-10-CM | POA: Diagnosis not present

## 2013-06-21 DIAGNOSIS — L570 Actinic keratosis: Secondary | ICD-10-CM | POA: Diagnosis not present

## 2013-06-22 DIAGNOSIS — H33059 Total retinal detachment, unspecified eye: Secondary | ICD-10-CM | POA: Diagnosis not present

## 2013-06-22 DIAGNOSIS — Z5189 Encounter for other specified aftercare: Secondary | ICD-10-CM | POA: Diagnosis not present

## 2013-06-22 DIAGNOSIS — H43819 Vitreous degeneration, unspecified eye: Secondary | ICD-10-CM | POA: Diagnosis not present

## 2013-06-22 DIAGNOSIS — Z961 Presence of intraocular lens: Secondary | ICD-10-CM | POA: Diagnosis not present

## 2013-06-22 DIAGNOSIS — H33029 Retinal detachment with multiple breaks, unspecified eye: Secondary | ICD-10-CM | POA: Diagnosis not present

## 2013-08-01 DIAGNOSIS — L84 Corns and callosities: Secondary | ICD-10-CM | POA: Diagnosis not present

## 2013-08-01 DIAGNOSIS — E119 Type 2 diabetes mellitus without complications: Secondary | ICD-10-CM | POA: Diagnosis not present

## 2013-08-01 DIAGNOSIS — L608 Other nail disorders: Secondary | ICD-10-CM | POA: Diagnosis not present

## 2013-08-10 DIAGNOSIS — R197 Diarrhea, unspecified: Secondary | ICD-10-CM | POA: Diagnosis not present

## 2013-08-10 DIAGNOSIS — Z23 Encounter for immunization: Secondary | ICD-10-CM | POA: Diagnosis not present

## 2013-08-10 DIAGNOSIS — E1159 Type 2 diabetes mellitus with other circulatory complications: Secondary | ICD-10-CM | POA: Diagnosis not present

## 2013-08-10 DIAGNOSIS — I10 Essential (primary) hypertension: Secondary | ICD-10-CM | POA: Diagnosis not present

## 2013-08-10 DIAGNOSIS — R413 Other amnesia: Secondary | ICD-10-CM | POA: Diagnosis not present

## 2013-08-15 DIAGNOSIS — C61 Malignant neoplasm of prostate: Secondary | ICD-10-CM | POA: Diagnosis not present

## 2013-08-22 DIAGNOSIS — N318 Other neuromuscular dysfunction of bladder: Secondary | ICD-10-CM | POA: Diagnosis not present

## 2013-08-22 DIAGNOSIS — C61 Malignant neoplasm of prostate: Secondary | ICD-10-CM | POA: Diagnosis not present

## 2013-08-22 DIAGNOSIS — E291 Testicular hypofunction: Secondary | ICD-10-CM | POA: Diagnosis not present

## 2013-09-26 DIAGNOSIS — E785 Hyperlipidemia, unspecified: Secondary | ICD-10-CM | POA: Diagnosis not present

## 2013-09-26 DIAGNOSIS — Z125 Encounter for screening for malignant neoplasm of prostate: Secondary | ICD-10-CM | POA: Diagnosis not present

## 2013-09-26 DIAGNOSIS — Z1212 Encounter for screening for malignant neoplasm of rectum: Secondary | ICD-10-CM | POA: Diagnosis not present

## 2013-09-26 DIAGNOSIS — I2510 Atherosclerotic heart disease of native coronary artery without angina pectoris: Secondary | ICD-10-CM | POA: Diagnosis not present

## 2013-09-26 DIAGNOSIS — M810 Age-related osteoporosis without current pathological fracture: Secondary | ICD-10-CM | POA: Diagnosis not present

## 2013-09-26 DIAGNOSIS — E1159 Type 2 diabetes mellitus with other circulatory complications: Secondary | ICD-10-CM | POA: Diagnosis not present

## 2013-10-04 DIAGNOSIS — E1159 Type 2 diabetes mellitus with other circulatory complications: Secondary | ICD-10-CM | POA: Diagnosis not present

## 2013-10-04 DIAGNOSIS — Z23 Encounter for immunization: Secondary | ICD-10-CM | POA: Diagnosis not present

## 2013-10-04 DIAGNOSIS — R413 Other amnesia: Secondary | ICD-10-CM | POA: Diagnosis not present

## 2013-10-04 DIAGNOSIS — R809 Proteinuria, unspecified: Secondary | ICD-10-CM | POA: Diagnosis not present

## 2013-10-04 DIAGNOSIS — E785 Hyperlipidemia, unspecified: Secondary | ICD-10-CM | POA: Diagnosis not present

## 2013-10-04 DIAGNOSIS — Z0000 Encounter for general adult medical examination without abnormal findings: Secondary | ICD-10-CM | POA: Diagnosis not present

## 2013-10-04 DIAGNOSIS — I2510 Atherosclerotic heart disease of native coronary artery without angina pectoris: Secondary | ICD-10-CM | POA: Diagnosis not present

## 2013-10-04 DIAGNOSIS — K520 Gastroenteritis and colitis due to radiation: Secondary | ICD-10-CM | POA: Diagnosis not present

## 2013-10-04 DIAGNOSIS — M810 Age-related osteoporosis without current pathological fracture: Secondary | ICD-10-CM | POA: Diagnosis not present

## 2013-10-04 DIAGNOSIS — Z1331 Encounter for screening for depression: Secondary | ICD-10-CM | POA: Diagnosis not present

## 2013-10-23 ENCOUNTER — Ambulatory Visit (HOSPITAL_COMMUNITY)
Admission: RE | Admit: 2013-10-23 | Discharge: 2013-10-23 | Disposition: A | Discharge status: Home / Self Care | Payer: Medicare Other | Source: Ambulatory Visit | Attending: Internal Medicine | Admitting: Internal Medicine

## 2013-10-23 ENCOUNTER — Other Ambulatory Visit (HOSPITAL_COMMUNITY): Payer: Self-pay | Admitting: Internal Medicine

## 2013-10-23 DIAGNOSIS — M810 Age-related osteoporosis without current pathological fracture: Secondary | ICD-10-CM | POA: Insufficient documentation

## 2013-10-23 MED ORDER — ZOLEDRONIC ACID 5 MG/100ML IV SOLN
5.0000 mg | Freq: Once | INTRAVENOUS | Status: AC
  Administered 2013-10-23: 5 mg via INTRAVENOUS
  Filled 2013-10-23: qty 100

## 2013-10-23 MED ORDER — SODIUM CHLORIDE 0.9 % IV SOLN
Freq: Once | INTRAVENOUS | Status: AC
  Administered 2013-10-23: 10:00:00 via INTRAVENOUS

## 2013-10-24 DIAGNOSIS — L608 Other nail disorders: Secondary | ICD-10-CM | POA: Diagnosis not present

## 2013-10-24 DIAGNOSIS — L84 Corns and callosities: Secondary | ICD-10-CM | POA: Diagnosis not present

## 2013-10-24 DIAGNOSIS — E119 Type 2 diabetes mellitus without complications: Secondary | ICD-10-CM | POA: Diagnosis not present

## 2013-11-07 DIAGNOSIS — E1159 Type 2 diabetes mellitus with other circulatory complications: Secondary | ICD-10-CM | POA: Diagnosis not present

## 2013-11-07 DIAGNOSIS — I10 Essential (primary) hypertension: Secondary | ICD-10-CM | POA: Diagnosis not present

## 2013-12-20 DIAGNOSIS — C44319 Basal cell carcinoma of skin of other parts of face: Secondary | ICD-10-CM | POA: Diagnosis not present

## 2013-12-20 DIAGNOSIS — L570 Actinic keratosis: Secondary | ICD-10-CM | POA: Diagnosis not present

## 2013-12-20 DIAGNOSIS — L821 Other seborrheic keratosis: Secondary | ICD-10-CM | POA: Diagnosis not present

## 2013-12-20 DIAGNOSIS — L820 Inflamed seborrheic keratosis: Secondary | ICD-10-CM | POA: Diagnosis not present

## 2013-12-20 DIAGNOSIS — D235 Other benign neoplasm of skin of trunk: Secondary | ICD-10-CM | POA: Diagnosis not present

## 2013-12-26 DIAGNOSIS — E119 Type 2 diabetes mellitus without complications: Secondary | ICD-10-CM | POA: Diagnosis not present

## 2013-12-26 DIAGNOSIS — L608 Other nail disorders: Secondary | ICD-10-CM | POA: Diagnosis not present

## 2014-01-04 DIAGNOSIS — Z6830 Body mass index (BMI) 30.0-30.9, adult: Secondary | ICD-10-CM | POA: Diagnosis not present

## 2014-01-04 DIAGNOSIS — D72820 Lymphocytosis (symptomatic): Secondary | ICD-10-CM | POA: Diagnosis not present

## 2014-01-04 DIAGNOSIS — H9190 Unspecified hearing loss, unspecified ear: Secondary | ICD-10-CM | POA: Diagnosis not present

## 2014-01-04 DIAGNOSIS — I2510 Atherosclerotic heart disease of native coronary artery without angina pectoris: Secondary | ICD-10-CM | POA: Diagnosis not present

## 2014-01-04 DIAGNOSIS — E1159 Type 2 diabetes mellitus with other circulatory complications: Secondary | ICD-10-CM | POA: Diagnosis not present

## 2014-01-04 DIAGNOSIS — R413 Other amnesia: Secondary | ICD-10-CM | POA: Diagnosis not present

## 2014-01-29 DIAGNOSIS — H6500 Acute serous otitis media, unspecified ear: Secondary | ICD-10-CM | POA: Diagnosis not present

## 2014-01-29 DIAGNOSIS — H902 Conductive hearing loss, unspecified: Secondary | ICD-10-CM | POA: Diagnosis not present

## 2014-01-31 DIAGNOSIS — Z85828 Personal history of other malignant neoplasm of skin: Secondary | ICD-10-CM | POA: Diagnosis not present

## 2014-01-31 DIAGNOSIS — L570 Actinic keratosis: Secondary | ICD-10-CM | POA: Diagnosis not present

## 2014-02-07 DIAGNOSIS — I10 Essential (primary) hypertension: Secondary | ICD-10-CM | POA: Diagnosis not present

## 2014-02-07 DIAGNOSIS — E1159 Type 2 diabetes mellitus with other circulatory complications: Secondary | ICD-10-CM | POA: Diagnosis not present

## 2014-02-07 DIAGNOSIS — Z6830 Body mass index (BMI) 30.0-30.9, adult: Secondary | ICD-10-CM | POA: Diagnosis not present

## 2014-02-12 DIAGNOSIS — C61 Malignant neoplasm of prostate: Secondary | ICD-10-CM | POA: Diagnosis not present

## 2014-02-27 DIAGNOSIS — E119 Type 2 diabetes mellitus without complications: Secondary | ICD-10-CM | POA: Diagnosis not present

## 2014-02-27 DIAGNOSIS — L608 Other nail disorders: Secondary | ICD-10-CM | POA: Diagnosis not present

## 2014-02-28 DIAGNOSIS — M810 Age-related osteoporosis without current pathological fracture: Secondary | ICD-10-CM | POA: Diagnosis not present

## 2014-02-28 DIAGNOSIS — R293 Abnormal posture: Secondary | ICD-10-CM | POA: Diagnosis not present

## 2014-02-28 DIAGNOSIS — R269 Unspecified abnormalities of gait and mobility: Secondary | ICD-10-CM | POA: Diagnosis not present

## 2014-02-28 DIAGNOSIS — R488 Other symbolic dysfunctions: Secondary | ICD-10-CM | POA: Diagnosis not present

## 2014-03-02 DIAGNOSIS — N318 Other neuromuscular dysfunction of bladder: Secondary | ICD-10-CM | POA: Diagnosis not present

## 2014-03-02 DIAGNOSIS — C61 Malignant neoplasm of prostate: Secondary | ICD-10-CM | POA: Diagnosis not present

## 2014-03-22 DIAGNOSIS — S0530XA Ocular laceration without prolapse or loss of intraocular tissue, unspecified eye, initial encounter: Secondary | ICD-10-CM | POA: Diagnosis not present

## 2014-03-22 DIAGNOSIS — H33029 Retinal detachment with multiple breaks, unspecified eye: Secondary | ICD-10-CM | POA: Diagnosis not present

## 2014-03-22 DIAGNOSIS — Z5189 Encounter for other specified aftercare: Secondary | ICD-10-CM | POA: Diagnosis not present

## 2014-03-22 DIAGNOSIS — Z961 Presence of intraocular lens: Secondary | ICD-10-CM | POA: Diagnosis not present

## 2014-03-22 DIAGNOSIS — H33059 Total retinal detachment, unspecified eye: Secondary | ICD-10-CM | POA: Diagnosis not present

## 2014-03-22 DIAGNOSIS — H43819 Vitreous degeneration, unspecified eye: Secondary | ICD-10-CM | POA: Diagnosis not present

## 2014-03-23 DIAGNOSIS — M810 Age-related osteoporosis without current pathological fracture: Secondary | ICD-10-CM | POA: Diagnosis not present

## 2014-03-23 DIAGNOSIS — R488 Other symbolic dysfunctions: Secondary | ICD-10-CM | POA: Diagnosis not present

## 2014-03-23 DIAGNOSIS — R293 Abnormal posture: Secondary | ICD-10-CM | POA: Diagnosis not present

## 2014-03-23 DIAGNOSIS — R269 Unspecified abnormalities of gait and mobility: Secondary | ICD-10-CM | POA: Diagnosis not present

## 2014-03-26 DIAGNOSIS — R269 Unspecified abnormalities of gait and mobility: Secondary | ICD-10-CM | POA: Diagnosis not present

## 2014-03-26 DIAGNOSIS — R488 Other symbolic dysfunctions: Secondary | ICD-10-CM | POA: Diagnosis not present

## 2014-03-26 DIAGNOSIS — R293 Abnormal posture: Secondary | ICD-10-CM | POA: Diagnosis not present

## 2014-03-26 DIAGNOSIS — M810 Age-related osteoporosis without current pathological fracture: Secondary | ICD-10-CM | POA: Diagnosis not present

## 2014-03-27 DIAGNOSIS — R293 Abnormal posture: Secondary | ICD-10-CM | POA: Diagnosis not present

## 2014-03-27 DIAGNOSIS — M810 Age-related osteoporosis without current pathological fracture: Secondary | ICD-10-CM | POA: Diagnosis not present

## 2014-03-27 DIAGNOSIS — R269 Unspecified abnormalities of gait and mobility: Secondary | ICD-10-CM | POA: Diagnosis not present

## 2014-03-27 DIAGNOSIS — R488 Other symbolic dysfunctions: Secondary | ICD-10-CM | POA: Diagnosis not present

## 2014-03-28 DIAGNOSIS — M810 Age-related osteoporosis without current pathological fracture: Secondary | ICD-10-CM | POA: Diagnosis not present

## 2014-03-28 DIAGNOSIS — R488 Other symbolic dysfunctions: Secondary | ICD-10-CM | POA: Diagnosis not present

## 2014-03-28 DIAGNOSIS — R293 Abnormal posture: Secondary | ICD-10-CM | POA: Diagnosis not present

## 2014-03-28 DIAGNOSIS — R269 Unspecified abnormalities of gait and mobility: Secondary | ICD-10-CM | POA: Diagnosis not present

## 2014-03-29 DIAGNOSIS — R269 Unspecified abnormalities of gait and mobility: Secondary | ICD-10-CM | POA: Diagnosis not present

## 2014-03-29 DIAGNOSIS — R293 Abnormal posture: Secondary | ICD-10-CM | POA: Diagnosis not present

## 2014-03-29 DIAGNOSIS — M810 Age-related osteoporosis without current pathological fracture: Secondary | ICD-10-CM | POA: Diagnosis not present

## 2014-03-29 DIAGNOSIS — R488 Other symbolic dysfunctions: Secondary | ICD-10-CM | POA: Diagnosis not present

## 2014-04-10 DIAGNOSIS — Z79899 Other long term (current) drug therapy: Secondary | ICD-10-CM | POA: Diagnosis not present

## 2014-04-10 DIAGNOSIS — I2510 Atherosclerotic heart disease of native coronary artery without angina pectoris: Secondary | ICD-10-CM | POA: Diagnosis not present

## 2014-04-10 DIAGNOSIS — R269 Unspecified abnormalities of gait and mobility: Secondary | ICD-10-CM | POA: Diagnosis not present

## 2014-04-10 DIAGNOSIS — Z6830 Body mass index (BMI) 30.0-30.9, adult: Secondary | ICD-10-CM | POA: Diagnosis not present

## 2014-04-10 DIAGNOSIS — D72820 Lymphocytosis (symptomatic): Secondary | ICD-10-CM | POA: Diagnosis not present

## 2014-04-10 DIAGNOSIS — F39 Unspecified mood [affective] disorder: Secondary | ICD-10-CM | POA: Diagnosis not present

## 2014-04-10 DIAGNOSIS — R413 Other amnesia: Secondary | ICD-10-CM | POA: Diagnosis not present

## 2014-04-10 DIAGNOSIS — M810 Age-related osteoporosis without current pathological fracture: Secondary | ICD-10-CM | POA: Diagnosis not present

## 2014-04-10 DIAGNOSIS — E1159 Type 2 diabetes mellitus with other circulatory complications: Secondary | ICD-10-CM | POA: Diagnosis not present

## 2014-05-01 DIAGNOSIS — L608 Other nail disorders: Secondary | ICD-10-CM | POA: Diagnosis not present

## 2014-05-01 DIAGNOSIS — E119 Type 2 diabetes mellitus without complications: Secondary | ICD-10-CM | POA: Diagnosis not present

## 2014-06-13 DIAGNOSIS — I10 Essential (primary) hypertension: Secondary | ICD-10-CM | POA: Diagnosis not present

## 2014-06-13 DIAGNOSIS — E1159 Type 2 diabetes mellitus with other circulatory complications: Secondary | ICD-10-CM | POA: Diagnosis not present

## 2014-06-13 DIAGNOSIS — Z6831 Body mass index (BMI) 31.0-31.9, adult: Secondary | ICD-10-CM | POA: Diagnosis not present

## 2014-06-13 DIAGNOSIS — R269 Unspecified abnormalities of gait and mobility: Secondary | ICD-10-CM | POA: Diagnosis not present

## 2014-06-13 DIAGNOSIS — M810 Age-related osteoporosis without current pathological fracture: Secondary | ICD-10-CM | POA: Diagnosis not present

## 2014-07-11 DIAGNOSIS — E119 Type 2 diabetes mellitus without complications: Secondary | ICD-10-CM | POA: Diagnosis not present

## 2014-07-11 DIAGNOSIS — L608 Other nail disorders: Secondary | ICD-10-CM | POA: Diagnosis not present

## 2014-08-01 DIAGNOSIS — L570 Actinic keratosis: Secondary | ICD-10-CM | POA: Diagnosis not present

## 2014-08-01 DIAGNOSIS — Z85828 Personal history of other malignant neoplasm of skin: Secondary | ICD-10-CM | POA: Diagnosis not present

## 2014-08-01 DIAGNOSIS — L821 Other seborrheic keratosis: Secondary | ICD-10-CM | POA: Diagnosis not present

## 2014-09-11 DIAGNOSIS — N400 Enlarged prostate without lower urinary tract symptoms: Secondary | ICD-10-CM | POA: Diagnosis not present

## 2014-09-12 DIAGNOSIS — E1151 Type 2 diabetes mellitus with diabetic peripheral angiopathy without gangrene: Secondary | ICD-10-CM | POA: Diagnosis not present

## 2014-09-12 DIAGNOSIS — I10 Essential (primary) hypertension: Secondary | ICD-10-CM | POA: Diagnosis not present

## 2014-09-18 DIAGNOSIS — Z23 Encounter for immunization: Secondary | ICD-10-CM | POA: Diagnosis not present

## 2014-09-18 DIAGNOSIS — C61 Malignant neoplasm of prostate: Secondary | ICD-10-CM | POA: Diagnosis not present

## 2014-09-20 DIAGNOSIS — L602 Onychogryphosis: Secondary | ICD-10-CM | POA: Diagnosis not present

## 2014-09-20 DIAGNOSIS — E119 Type 2 diabetes mellitus without complications: Secondary | ICD-10-CM | POA: Diagnosis not present

## 2014-10-09 DIAGNOSIS — R2681 Unsteadiness on feet: Secondary | ICD-10-CM | POA: Diagnosis not present

## 2014-10-09 DIAGNOSIS — F0390 Unspecified dementia without behavioral disturbance: Secondary | ICD-10-CM | POA: Diagnosis not present

## 2014-10-12 DIAGNOSIS — F0390 Unspecified dementia without behavioral disturbance: Secondary | ICD-10-CM | POA: Diagnosis not present

## 2014-10-12 DIAGNOSIS — R2681 Unsteadiness on feet: Secondary | ICD-10-CM | POA: Diagnosis not present

## 2014-10-15 DIAGNOSIS — F0390 Unspecified dementia without behavioral disturbance: Secondary | ICD-10-CM | POA: Diagnosis not present

## 2014-10-15 DIAGNOSIS — R2681 Unsteadiness on feet: Secondary | ICD-10-CM | POA: Diagnosis not present

## 2014-10-16 DIAGNOSIS — R2681 Unsteadiness on feet: Secondary | ICD-10-CM | POA: Diagnosis not present

## 2014-10-16 DIAGNOSIS — F0390 Unspecified dementia without behavioral disturbance: Secondary | ICD-10-CM | POA: Diagnosis not present

## 2014-10-19 DIAGNOSIS — F0390 Unspecified dementia without behavioral disturbance: Secondary | ICD-10-CM | POA: Diagnosis not present

## 2014-10-19 DIAGNOSIS — R2681 Unsteadiness on feet: Secondary | ICD-10-CM | POA: Diagnosis not present

## 2014-10-22 DIAGNOSIS — R2681 Unsteadiness on feet: Secondary | ICD-10-CM | POA: Diagnosis not present

## 2014-10-22 DIAGNOSIS — F0390 Unspecified dementia without behavioral disturbance: Secondary | ICD-10-CM | POA: Diagnosis not present

## 2014-10-24 DIAGNOSIS — F0390 Unspecified dementia without behavioral disturbance: Secondary | ICD-10-CM | POA: Diagnosis not present

## 2014-10-24 DIAGNOSIS — R2681 Unsteadiness on feet: Secondary | ICD-10-CM | POA: Diagnosis not present

## 2014-10-25 DIAGNOSIS — F0390 Unspecified dementia without behavioral disturbance: Secondary | ICD-10-CM | POA: Diagnosis not present

## 2014-10-25 DIAGNOSIS — R2681 Unsteadiness on feet: Secondary | ICD-10-CM | POA: Diagnosis not present

## 2014-10-26 DIAGNOSIS — F0390 Unspecified dementia without behavioral disturbance: Secondary | ICD-10-CM | POA: Diagnosis not present

## 2014-10-26 DIAGNOSIS — R2681 Unsteadiness on feet: Secondary | ICD-10-CM | POA: Diagnosis not present

## 2014-10-29 DIAGNOSIS — F0390 Unspecified dementia without behavioral disturbance: Secondary | ICD-10-CM | POA: Diagnosis not present

## 2014-10-29 DIAGNOSIS — R2681 Unsteadiness on feet: Secondary | ICD-10-CM | POA: Diagnosis not present

## 2014-10-31 DIAGNOSIS — R2681 Unsteadiness on feet: Secondary | ICD-10-CM | POA: Diagnosis not present

## 2014-10-31 DIAGNOSIS — N400 Enlarged prostate without lower urinary tract symptoms: Secondary | ICD-10-CM | POA: Diagnosis not present

## 2014-10-31 DIAGNOSIS — E119 Type 2 diabetes mellitus without complications: Secondary | ICD-10-CM | POA: Diagnosis not present

## 2014-10-31 DIAGNOSIS — D649 Anemia, unspecified: Secondary | ICD-10-CM | POA: Diagnosis not present

## 2014-10-31 DIAGNOSIS — F0390 Unspecified dementia without behavioral disturbance: Secondary | ICD-10-CM | POA: Diagnosis not present

## 2014-10-31 DIAGNOSIS — E559 Vitamin D deficiency, unspecified: Secondary | ICD-10-CM | POA: Diagnosis not present

## 2014-10-31 DIAGNOSIS — E785 Hyperlipidemia, unspecified: Secondary | ICD-10-CM | POA: Diagnosis not present

## 2014-11-02 DIAGNOSIS — F0390 Unspecified dementia without behavioral disturbance: Secondary | ICD-10-CM | POA: Diagnosis not present

## 2014-11-02 DIAGNOSIS — R2681 Unsteadiness on feet: Secondary | ICD-10-CM | POA: Diagnosis not present

## 2014-11-07 DIAGNOSIS — E1151 Type 2 diabetes mellitus with diabetic peripheral angiopathy without gangrene: Secondary | ICD-10-CM | POA: Diagnosis not present

## 2014-11-07 DIAGNOSIS — Z1389 Encounter for screening for other disorder: Secondary | ICD-10-CM | POA: Diagnosis not present

## 2014-11-07 DIAGNOSIS — F39 Unspecified mood [affective] disorder: Secondary | ICD-10-CM | POA: Diagnosis not present

## 2014-11-07 DIAGNOSIS — H9190 Unspecified hearing loss, unspecified ear: Secondary | ICD-10-CM | POA: Diagnosis not present

## 2014-11-07 DIAGNOSIS — K627 Radiation proctitis: Secondary | ICD-10-CM | POA: Diagnosis not present

## 2014-11-07 DIAGNOSIS — E038 Other specified hypothyroidism: Secondary | ICD-10-CM | POA: Diagnosis not present

## 2014-11-07 DIAGNOSIS — R413 Other amnesia: Secondary | ICD-10-CM | POA: Diagnosis not present

## 2014-11-07 DIAGNOSIS — D72820 Lymphocytosis (symptomatic): Secondary | ICD-10-CM | POA: Diagnosis not present

## 2014-11-07 DIAGNOSIS — Z0000 Encounter for general adult medical examination without abnormal findings: Secondary | ICD-10-CM | POA: Diagnosis not present

## 2014-11-07 DIAGNOSIS — R809 Proteinuria, unspecified: Secondary | ICD-10-CM | POA: Diagnosis not present

## 2014-11-09 DIAGNOSIS — R2681 Unsteadiness on feet: Secondary | ICD-10-CM | POA: Diagnosis not present

## 2014-11-09 DIAGNOSIS — F0390 Unspecified dementia without behavioral disturbance: Secondary | ICD-10-CM | POA: Diagnosis not present

## 2014-11-22 DIAGNOSIS — L602 Onychogryphosis: Secondary | ICD-10-CM | POA: Diagnosis not present

## 2014-11-22 DIAGNOSIS — E1151 Type 2 diabetes mellitus with diabetic peripheral angiopathy without gangrene: Secondary | ICD-10-CM | POA: Diagnosis not present

## 2014-12-18 DIAGNOSIS — C44221 Squamous cell carcinoma of skin of unspecified ear and external auricular canal: Secondary | ICD-10-CM | POA: Diagnosis not present

## 2014-12-18 DIAGNOSIS — L820 Inflamed seborrheic keratosis: Secondary | ICD-10-CM | POA: Diagnosis not present

## 2014-12-18 DIAGNOSIS — C44319 Basal cell carcinoma of skin of other parts of face: Secondary | ICD-10-CM | POA: Diagnosis not present

## 2014-12-18 DIAGNOSIS — C44229 Squamous cell carcinoma of skin of left ear and external auricular canal: Secondary | ICD-10-CM | POA: Diagnosis not present

## 2014-12-19 DIAGNOSIS — Z6831 Body mass index (BMI) 31.0-31.9, adult: Secondary | ICD-10-CM | POA: Diagnosis not present

## 2014-12-19 DIAGNOSIS — E1159 Type 2 diabetes mellitus with other circulatory complications: Secondary | ICD-10-CM | POA: Diagnosis not present

## 2014-12-19 DIAGNOSIS — I10 Essential (primary) hypertension: Secondary | ICD-10-CM | POA: Diagnosis not present

## 2014-12-26 DIAGNOSIS — E039 Hypothyroidism, unspecified: Secondary | ICD-10-CM | POA: Diagnosis not present

## 2014-12-27 DIAGNOSIS — H33051 Total retinal detachment, right eye: Secondary | ICD-10-CM | POA: Diagnosis not present

## 2014-12-27 DIAGNOSIS — H43813 Vitreous degeneration, bilateral: Secondary | ICD-10-CM | POA: Diagnosis not present

## 2014-12-27 DIAGNOSIS — H33023 Retinal detachment with multiple breaks, bilateral: Secondary | ICD-10-CM | POA: Diagnosis not present

## 2014-12-27 DIAGNOSIS — Z961 Presence of intraocular lens: Secondary | ICD-10-CM | POA: Diagnosis not present

## 2014-12-27 DIAGNOSIS — S0531XD Ocular laceration without prolapse or loss of intraocular tissue, right eye, subsequent encounter: Secondary | ICD-10-CM | POA: Diagnosis not present

## 2015-01-15 DIAGNOSIS — B351 Tinea unguium: Secondary | ICD-10-CM | POA: Diagnosis not present

## 2015-02-14 DIAGNOSIS — Z85828 Personal history of other malignant neoplasm of skin: Secondary | ICD-10-CM | POA: Diagnosis not present

## 2015-02-14 DIAGNOSIS — Z08 Encounter for follow-up examination after completed treatment for malignant neoplasm: Secondary | ICD-10-CM | POA: Diagnosis not present

## 2015-03-12 DIAGNOSIS — E1159 Type 2 diabetes mellitus with other circulatory complications: Secondary | ICD-10-CM | POA: Diagnosis not present

## 2015-03-12 DIAGNOSIS — I2510 Atherosclerotic heart disease of native coronary artery without angina pectoris: Secondary | ICD-10-CM | POA: Diagnosis not present

## 2015-03-12 DIAGNOSIS — M810 Age-related osteoporosis without current pathological fracture: Secondary | ICD-10-CM | POA: Diagnosis not present

## 2015-03-12 DIAGNOSIS — F329 Major depressive disorder, single episode, unspecified: Secondary | ICD-10-CM | POA: Diagnosis not present

## 2015-03-12 DIAGNOSIS — R269 Unspecified abnormalities of gait and mobility: Secondary | ICD-10-CM | POA: Diagnosis not present

## 2015-03-12 DIAGNOSIS — E039 Hypothyroidism, unspecified: Secondary | ICD-10-CM | POA: Diagnosis not present

## 2015-03-12 DIAGNOSIS — Z6832 Body mass index (BMI) 32.0-32.9, adult: Secondary | ICD-10-CM | POA: Diagnosis not present

## 2015-03-12 DIAGNOSIS — B351 Tinea unguium: Secondary | ICD-10-CM | POA: Diagnosis not present

## 2015-03-18 DIAGNOSIS — C61 Malignant neoplasm of prostate: Secondary | ICD-10-CM | POA: Diagnosis not present

## 2015-03-28 DIAGNOSIS — M6281 Muscle weakness (generalized): Secondary | ICD-10-CM | POA: Diagnosis not present

## 2015-03-28 DIAGNOSIS — R2689 Other abnormalities of gait and mobility: Secondary | ICD-10-CM | POA: Diagnosis not present

## 2015-03-28 DIAGNOSIS — F0390 Unspecified dementia without behavioral disturbance: Secondary | ICD-10-CM | POA: Diagnosis not present

## 2015-03-28 DIAGNOSIS — Z9181 History of falling: Secondary | ICD-10-CM | POA: Diagnosis not present

## 2015-04-01 DIAGNOSIS — Z9181 History of falling: Secondary | ICD-10-CM | POA: Diagnosis not present

## 2015-04-01 DIAGNOSIS — R2689 Other abnormalities of gait and mobility: Secondary | ICD-10-CM | POA: Diagnosis not present

## 2015-04-01 DIAGNOSIS — F0390 Unspecified dementia without behavioral disturbance: Secondary | ICD-10-CM | POA: Diagnosis not present

## 2015-04-01 DIAGNOSIS — M6281 Muscle weakness (generalized): Secondary | ICD-10-CM | POA: Diagnosis not present

## 2015-04-02 DIAGNOSIS — F0390 Unspecified dementia without behavioral disturbance: Secondary | ICD-10-CM | POA: Diagnosis not present

## 2015-04-02 DIAGNOSIS — Z9181 History of falling: Secondary | ICD-10-CM | POA: Diagnosis not present

## 2015-04-02 DIAGNOSIS — R2689 Other abnormalities of gait and mobility: Secondary | ICD-10-CM | POA: Diagnosis not present

## 2015-04-02 DIAGNOSIS — M6281 Muscle weakness (generalized): Secondary | ICD-10-CM | POA: Diagnosis not present

## 2015-04-03 DIAGNOSIS — Z9181 History of falling: Secondary | ICD-10-CM | POA: Diagnosis not present

## 2015-04-03 DIAGNOSIS — M6281 Muscle weakness (generalized): Secondary | ICD-10-CM | POA: Diagnosis not present

## 2015-04-03 DIAGNOSIS — F0390 Unspecified dementia without behavioral disturbance: Secondary | ICD-10-CM | POA: Diagnosis not present

## 2015-04-03 DIAGNOSIS — R2689 Other abnormalities of gait and mobility: Secondary | ICD-10-CM | POA: Diagnosis not present

## 2015-04-04 DIAGNOSIS — M6281 Muscle weakness (generalized): Secondary | ICD-10-CM | POA: Diagnosis not present

## 2015-04-04 DIAGNOSIS — R2689 Other abnormalities of gait and mobility: Secondary | ICD-10-CM | POA: Diagnosis not present

## 2015-04-04 DIAGNOSIS — Z9181 History of falling: Secondary | ICD-10-CM | POA: Diagnosis not present

## 2015-04-04 DIAGNOSIS — F0390 Unspecified dementia without behavioral disturbance: Secondary | ICD-10-CM | POA: Diagnosis not present

## 2015-04-05 ENCOUNTER — Ambulatory Visit (HOSPITAL_COMMUNITY): Admission: RE | Admit: 2015-04-05 | Payer: Medicare Other | Source: Ambulatory Visit

## 2015-04-05 ENCOUNTER — Other Ambulatory Visit (HOSPITAL_COMMUNITY): Payer: Self-pay | Admitting: Internal Medicine

## 2015-04-08 DIAGNOSIS — M6281 Muscle weakness (generalized): Secondary | ICD-10-CM | POA: Diagnosis not present

## 2015-04-08 DIAGNOSIS — F0390 Unspecified dementia without behavioral disturbance: Secondary | ICD-10-CM | POA: Diagnosis not present

## 2015-04-08 DIAGNOSIS — R2689 Other abnormalities of gait and mobility: Secondary | ICD-10-CM | POA: Diagnosis not present

## 2015-04-08 DIAGNOSIS — Z9181 History of falling: Secondary | ICD-10-CM | POA: Diagnosis not present

## 2015-04-09 DIAGNOSIS — M6281 Muscle weakness (generalized): Secondary | ICD-10-CM | POA: Diagnosis not present

## 2015-04-09 DIAGNOSIS — R2689 Other abnormalities of gait and mobility: Secondary | ICD-10-CM | POA: Diagnosis not present

## 2015-04-09 DIAGNOSIS — Z9181 History of falling: Secondary | ICD-10-CM | POA: Diagnosis not present

## 2015-04-09 DIAGNOSIS — F0390 Unspecified dementia without behavioral disturbance: Secondary | ICD-10-CM | POA: Diagnosis not present

## 2015-04-10 DIAGNOSIS — R2689 Other abnormalities of gait and mobility: Secondary | ICD-10-CM | POA: Diagnosis not present

## 2015-04-10 DIAGNOSIS — M6281 Muscle weakness (generalized): Secondary | ICD-10-CM | POA: Diagnosis not present

## 2015-04-10 DIAGNOSIS — F0390 Unspecified dementia without behavioral disturbance: Secondary | ICD-10-CM | POA: Diagnosis not present

## 2015-04-10 DIAGNOSIS — Z9181 History of falling: Secondary | ICD-10-CM | POA: Diagnosis not present

## 2015-04-11 DIAGNOSIS — Z9181 History of falling: Secondary | ICD-10-CM | POA: Diagnosis not present

## 2015-04-11 DIAGNOSIS — R2689 Other abnormalities of gait and mobility: Secondary | ICD-10-CM | POA: Diagnosis not present

## 2015-04-11 DIAGNOSIS — F0390 Unspecified dementia without behavioral disturbance: Secondary | ICD-10-CM | POA: Diagnosis not present

## 2015-04-11 DIAGNOSIS — M6281 Muscle weakness (generalized): Secondary | ICD-10-CM | POA: Diagnosis not present

## 2015-04-12 DIAGNOSIS — F0390 Unspecified dementia without behavioral disturbance: Secondary | ICD-10-CM | POA: Diagnosis not present

## 2015-04-12 DIAGNOSIS — R2689 Other abnormalities of gait and mobility: Secondary | ICD-10-CM | POA: Diagnosis not present

## 2015-04-12 DIAGNOSIS — Z9181 History of falling: Secondary | ICD-10-CM | POA: Diagnosis not present

## 2015-04-12 DIAGNOSIS — M6281 Muscle weakness (generalized): Secondary | ICD-10-CM | POA: Diagnosis not present

## 2015-04-15 DIAGNOSIS — F0390 Unspecified dementia without behavioral disturbance: Secondary | ICD-10-CM | POA: Diagnosis not present

## 2015-04-15 DIAGNOSIS — R2689 Other abnormalities of gait and mobility: Secondary | ICD-10-CM | POA: Diagnosis not present

## 2015-04-15 DIAGNOSIS — M6281 Muscle weakness (generalized): Secondary | ICD-10-CM | POA: Diagnosis not present

## 2015-04-15 DIAGNOSIS — Z9181 History of falling: Secondary | ICD-10-CM | POA: Diagnosis not present

## 2015-04-16 DIAGNOSIS — F0390 Unspecified dementia without behavioral disturbance: Secondary | ICD-10-CM | POA: Diagnosis not present

## 2015-04-16 DIAGNOSIS — Z9181 History of falling: Secondary | ICD-10-CM | POA: Diagnosis not present

## 2015-04-16 DIAGNOSIS — R2689 Other abnormalities of gait and mobility: Secondary | ICD-10-CM | POA: Diagnosis not present

## 2015-04-16 DIAGNOSIS — M6281 Muscle weakness (generalized): Secondary | ICD-10-CM | POA: Diagnosis not present

## 2015-04-17 DIAGNOSIS — Z9181 History of falling: Secondary | ICD-10-CM | POA: Diagnosis not present

## 2015-04-17 DIAGNOSIS — F0390 Unspecified dementia without behavioral disturbance: Secondary | ICD-10-CM | POA: Diagnosis not present

## 2015-04-17 DIAGNOSIS — M6281 Muscle weakness (generalized): Secondary | ICD-10-CM | POA: Diagnosis not present

## 2015-04-17 DIAGNOSIS — R2689 Other abnormalities of gait and mobility: Secondary | ICD-10-CM | POA: Diagnosis not present

## 2015-04-18 DIAGNOSIS — Z9181 History of falling: Secondary | ICD-10-CM | POA: Diagnosis not present

## 2015-04-18 DIAGNOSIS — R2689 Other abnormalities of gait and mobility: Secondary | ICD-10-CM | POA: Diagnosis not present

## 2015-04-18 DIAGNOSIS — M6281 Muscle weakness (generalized): Secondary | ICD-10-CM | POA: Diagnosis not present

## 2015-04-18 DIAGNOSIS — F0390 Unspecified dementia without behavioral disturbance: Secondary | ICD-10-CM | POA: Diagnosis not present

## 2015-04-19 DIAGNOSIS — M6281 Muscle weakness (generalized): Secondary | ICD-10-CM | POA: Diagnosis not present

## 2015-04-19 DIAGNOSIS — F0390 Unspecified dementia without behavioral disturbance: Secondary | ICD-10-CM | POA: Diagnosis not present

## 2015-04-19 DIAGNOSIS — Z9181 History of falling: Secondary | ICD-10-CM | POA: Diagnosis not present

## 2015-04-19 DIAGNOSIS — R2689 Other abnormalities of gait and mobility: Secondary | ICD-10-CM | POA: Diagnosis not present

## 2015-04-23 DIAGNOSIS — Z9181 History of falling: Secondary | ICD-10-CM | POA: Diagnosis not present

## 2015-04-23 DIAGNOSIS — F0390 Unspecified dementia without behavioral disturbance: Secondary | ICD-10-CM | POA: Diagnosis not present

## 2015-04-23 DIAGNOSIS — M6281 Muscle weakness (generalized): Secondary | ICD-10-CM | POA: Diagnosis not present

## 2015-04-23 DIAGNOSIS — R2689 Other abnormalities of gait and mobility: Secondary | ICD-10-CM | POA: Diagnosis not present

## 2015-04-24 DIAGNOSIS — Z9181 History of falling: Secondary | ICD-10-CM | POA: Diagnosis not present

## 2015-04-24 DIAGNOSIS — M6281 Muscle weakness (generalized): Secondary | ICD-10-CM | POA: Diagnosis not present

## 2015-04-24 DIAGNOSIS — F0390 Unspecified dementia without behavioral disturbance: Secondary | ICD-10-CM | POA: Diagnosis not present

## 2015-04-24 DIAGNOSIS — R2689 Other abnormalities of gait and mobility: Secondary | ICD-10-CM | POA: Diagnosis not present

## 2015-04-25 DIAGNOSIS — M6281 Muscle weakness (generalized): Secondary | ICD-10-CM | POA: Diagnosis not present

## 2015-04-25 DIAGNOSIS — Z9181 History of falling: Secondary | ICD-10-CM | POA: Diagnosis not present

## 2015-04-25 DIAGNOSIS — F0390 Unspecified dementia without behavioral disturbance: Secondary | ICD-10-CM | POA: Diagnosis not present

## 2015-04-25 DIAGNOSIS — R2689 Other abnormalities of gait and mobility: Secondary | ICD-10-CM | POA: Diagnosis not present

## 2015-04-30 DIAGNOSIS — M6281 Muscle weakness (generalized): Secondary | ICD-10-CM | POA: Diagnosis not present

## 2015-04-30 DIAGNOSIS — F0390 Unspecified dementia without behavioral disturbance: Secondary | ICD-10-CM | POA: Diagnosis not present

## 2015-04-30 DIAGNOSIS — Z9181 History of falling: Secondary | ICD-10-CM | POA: Diagnosis not present

## 2015-04-30 DIAGNOSIS — R2689 Other abnormalities of gait and mobility: Secondary | ICD-10-CM | POA: Diagnosis not present

## 2015-05-02 DIAGNOSIS — R2689 Other abnormalities of gait and mobility: Secondary | ICD-10-CM | POA: Diagnosis not present

## 2015-05-02 DIAGNOSIS — M6281 Muscle weakness (generalized): Secondary | ICD-10-CM | POA: Diagnosis not present

## 2015-05-02 DIAGNOSIS — Z9181 History of falling: Secondary | ICD-10-CM | POA: Diagnosis not present

## 2015-05-02 DIAGNOSIS — F0390 Unspecified dementia without behavioral disturbance: Secondary | ICD-10-CM | POA: Diagnosis not present

## 2015-05-07 DIAGNOSIS — I10 Essential (primary) hypertension: Secondary | ICD-10-CM | POA: Diagnosis not present

## 2015-05-07 DIAGNOSIS — Z6831 Body mass index (BMI) 31.0-31.9, adult: Secondary | ICD-10-CM | POA: Diagnosis not present

## 2015-05-07 DIAGNOSIS — E1159 Type 2 diabetes mellitus with other circulatory complications: Secondary | ICD-10-CM | POA: Diagnosis not present

## 2015-05-08 DIAGNOSIS — Z9181 History of falling: Secondary | ICD-10-CM | POA: Diagnosis not present

## 2015-05-08 DIAGNOSIS — M6281 Muscle weakness (generalized): Secondary | ICD-10-CM | POA: Diagnosis not present

## 2015-05-08 DIAGNOSIS — R2689 Other abnormalities of gait and mobility: Secondary | ICD-10-CM | POA: Diagnosis not present

## 2015-05-08 DIAGNOSIS — F0390 Unspecified dementia without behavioral disturbance: Secondary | ICD-10-CM | POA: Diagnosis not present

## 2015-05-09 DIAGNOSIS — Z9181 History of falling: Secondary | ICD-10-CM | POA: Diagnosis not present

## 2015-05-09 DIAGNOSIS — M6281 Muscle weakness (generalized): Secondary | ICD-10-CM | POA: Diagnosis not present

## 2015-05-09 DIAGNOSIS — R2689 Other abnormalities of gait and mobility: Secondary | ICD-10-CM | POA: Diagnosis not present

## 2015-05-09 DIAGNOSIS — F0390 Unspecified dementia without behavioral disturbance: Secondary | ICD-10-CM | POA: Diagnosis not present

## 2015-05-13 DIAGNOSIS — F0390 Unspecified dementia without behavioral disturbance: Secondary | ICD-10-CM | POA: Diagnosis not present

## 2015-05-13 DIAGNOSIS — M6281 Muscle weakness (generalized): Secondary | ICD-10-CM | POA: Diagnosis not present

## 2015-05-13 DIAGNOSIS — Z9181 History of falling: Secondary | ICD-10-CM | POA: Diagnosis not present

## 2015-05-13 DIAGNOSIS — R2689 Other abnormalities of gait and mobility: Secondary | ICD-10-CM | POA: Diagnosis not present

## 2015-05-16 DIAGNOSIS — Z85828 Personal history of other malignant neoplasm of skin: Secondary | ICD-10-CM | POA: Diagnosis not present

## 2015-05-16 DIAGNOSIS — M6281 Muscle weakness (generalized): Secondary | ICD-10-CM | POA: Diagnosis not present

## 2015-05-16 DIAGNOSIS — Z08 Encounter for follow-up examination after completed treatment for malignant neoplasm: Secondary | ICD-10-CM | POA: Diagnosis not present

## 2015-05-16 DIAGNOSIS — F0390 Unspecified dementia without behavioral disturbance: Secondary | ICD-10-CM | POA: Diagnosis not present

## 2015-05-16 DIAGNOSIS — Z9181 History of falling: Secondary | ICD-10-CM | POA: Diagnosis not present

## 2015-05-16 DIAGNOSIS — L309 Dermatitis, unspecified: Secondary | ICD-10-CM | POA: Diagnosis not present

## 2015-05-16 DIAGNOSIS — R2689 Other abnormalities of gait and mobility: Secondary | ICD-10-CM | POA: Diagnosis not present

## 2015-05-20 DIAGNOSIS — E1159 Type 2 diabetes mellitus with other circulatory complications: Secondary | ICD-10-CM | POA: Diagnosis not present

## 2015-05-20 DIAGNOSIS — I10 Essential (primary) hypertension: Secondary | ICD-10-CM | POA: Diagnosis not present

## 2015-05-20 DIAGNOSIS — R0600 Dyspnea, unspecified: Secondary | ICD-10-CM | POA: Diagnosis not present

## 2015-05-20 DIAGNOSIS — I8310 Varicose veins of unspecified lower extremity with inflammation: Secondary | ICD-10-CM | POA: Diagnosis not present

## 2015-05-20 DIAGNOSIS — Z6831 Body mass index (BMI) 31.0-31.9, adult: Secondary | ICD-10-CM | POA: Diagnosis not present

## 2015-05-21 DIAGNOSIS — Z9181 History of falling: Secondary | ICD-10-CM | POA: Diagnosis not present

## 2015-05-21 DIAGNOSIS — R0600 Dyspnea, unspecified: Secondary | ICD-10-CM | POA: Diagnosis not present

## 2015-05-21 DIAGNOSIS — F0390 Unspecified dementia without behavioral disturbance: Secondary | ICD-10-CM | POA: Diagnosis not present

## 2015-05-21 DIAGNOSIS — R2689 Other abnormalities of gait and mobility: Secondary | ICD-10-CM | POA: Diagnosis not present

## 2015-05-21 DIAGNOSIS — M6281 Muscle weakness (generalized): Secondary | ICD-10-CM | POA: Diagnosis not present

## 2015-05-22 ENCOUNTER — Ambulatory Visit: Payer: BLUE CROSS/BLUE SHIELD | Admitting: Nurse Practitioner

## 2015-05-23 DIAGNOSIS — F0390 Unspecified dementia without behavioral disturbance: Secondary | ICD-10-CM | POA: Diagnosis not present

## 2015-05-23 DIAGNOSIS — Z9181 History of falling: Secondary | ICD-10-CM | POA: Diagnosis not present

## 2015-05-23 DIAGNOSIS — R2689 Other abnormalities of gait and mobility: Secondary | ICD-10-CM | POA: Diagnosis not present

## 2015-05-23 DIAGNOSIS — M6281 Muscle weakness (generalized): Secondary | ICD-10-CM | POA: Diagnosis not present

## 2015-05-23 NOTE — Progress Notes (Signed)
05/24/2015 [name]   [birth year]  [id number]  Primary Physician Jerlyn Ly, MD Primary Cardiologist: Dr. Acie Fredrickson   Reason for Visit/CC: LEE (referral by PCP)  HPI:  The patient is a 81 y/o male, last seen by Dr. Acie Fredrickson in 2013, with a history of coronary artery disease. He is status post PTCA and stenting of the left anterior descending artery and diagonal artery in 2008. He was also noted to have a small stenosis in the left circumflex artery at that time, however decision was made to treat medically.  He also has a history of hyperlipidemia, diabetes mellitus and prostate CA.  He presents to clinic today for evaluation for LEE and concerns for new HF. He was seen recently by his PCP and complained of LEE. He was started on low dose Lasix. According to the patient and his daughter, he also had a CXR that was concerning for CHF (I don't have access to this). He denies dyspnea, orthopnea, PND and chest pain. His PCP referred him back to our clinic for further evaluation.    Current Outpatient Prescriptions  Medication Sig Dispense Refill  . bismuth subsalicylate (KAOPECTATE) 262 MG/15ML suspension Take 30 mLs by mouth every 6 (six) hours as needed.    Marland Kitchen CINNAMON PO Take 1,000 mg by mouth daily.      . Insulin Lispro, Human, (HUMALOG [place]) Inject into the skin.    Marland Kitchen aspirin EC 81 MG tablet Take 81 mg by mouth daily.      . B-D ULTRAFINE III SHORT PEN 31G X 8 MM MISC Inject as directed daily.     . calcium citrate-vitamin D (CITRACAL+D) 315-200 MG-UNIT per tablet Take 1 tablet by mouth daily.      . cholecalciferol (VITAMIN D) 1000 UNITS tablet Take 1,000 Units by mouth daily.      Marland Kitchen escitalopram (LEXAPRO) 5 MG tablet Take 5 mg by mouth daily.      . fish oil-omega-3 fatty acids 1000 MG capsule Take 1 g by mouth daily.      . insulin glargine (LANTUS) 100 UNIT/ML injection Inject 35 Units into the skin 2 (two) times daily.     . isosorbide mononitrate (IMDUR) 60 MG 24 hr tablet TAKE  ONE-HALF TABLET EVERY MORNING 15 tablet 5  . metFORMIN (GLUCOPHAGE) 1000 MG tablet Take 1 tablet (1,000 mg total) by mouth 2 (two) times daily with a meal. 60 tablet 11  . Multiple Vitamin (MULTIVITAMIN) tablet Take 1 tablet by mouth daily.      . naproxen (NAPROSYN) 500 MG tablet Take 1 every 12 hours as needed for toe pain. Best taken with a meal. 30 tablet 0  . rosuvastatin (CRESTOR) 20 MG tablet Take 20 mg by mouth daily.      . Tamsulosin HCl (FLOMAX) 0.4 MG CAPS Take 0.4 mg by mouth daily.      . vitamin B-12 (CYANOCOBALAMIN) 500 MCG tablet Take 500 mcg by mouth daily.       No current facility-administered medications for this visit.    Allergies  Allergen Reactions  . Actos [Pioglitazone Hydrochloride]     swelling around lips  . Sulfonamide Derivatives     Red streaks on legs    History   Social History  . Marital Status: Widowed    Spouse Name: N/A  . Number of Children: N/A  . Years of Education: N/A   Occupational History  . Not on file.   Social History Main Topics  . Smoking  status: Never Smoker   . Smokeless tobacco: Not on file  . Alcohol Use: Yes     Comment: occassionally  . Drug Use: No  . Sexual Activity: Not on file   Other Topics Concern  . Not on file   Social History Narrative     Review of Systems: General: negative for chills, fever, night sweats or weight changes.  Cardiovascular: negative for chest pain, dyspnea on exertion, edema, orthopnea, palpitations, paroxysmal nocturnal dyspnea or shortness of breath Dermatological: negative for rash Respiratory: negative for cough or wheezing Urologic: negative for hematuria Abdominal: negative for nausea, vomiting, diarrhea, bright red blood per rectum, melena, or hematemesis Neurologic: negative for visual changes, syncope, or dizziness All other systems reviewed and are otherwise negative except as noted above.    Blood pressure 116/54, pulse 66, height 6\' 2"  (1.88 m), weight 235 lb  (106.595 kg).  General appearance: alert, cooperative and no distress Neck: no carotid bruit and no JVD Lungs: clear to auscultation bilaterally Heart: regular rate and rhythm, S1, S2 normal, no murmur, click, rub or gallop Extremities: + bilateral LEE Pulses: 2+ and symmetric Skin: warm and dry Neurologic: Grossly normal  EKG SR with PACs. HR 66 bpm  ASSESSMENT AND PLAN:   1. CAD: s/p PCI to  left anterior descending artery and diagonal artery in 2008 with plans for medical therapy regarding a stenosis of a small LCX artery. Denies recurrent CP. Given concerns for new onset HF, will check a 2D echo to assess LV systolic function and wall motion.   2. Acute Diastolic CHF: bilateral LEE noted on exam. Given advanced age and h/o HTN, would not be surprised if he has some diastolic dysfunction. We will check a 2D echo to evaluate systolic function. Will also order a BNP today. Continue daily lasix. We discussed importance of daily weights and low sodium diet.   3. HLD: Well controlled. Followed by his PCP.   4. T2DM: followed by his PCP.   5. Medication Monitoring: Lasix was started last week by PCP. He has not had f/u labs. Will order a BMP today to assess renal function and electrolytes.    PLAN  He was lost to f/u and hasn't seen Dr. Acie Fredrickson since 2013. I've recommended that he f/u with him for reassessment and to review echo findings in 2-3 weeks  Sheritta Deeg PA-C 05/24/2015 1:44 PM

## 2015-05-24 ENCOUNTER — Ambulatory Visit (INDEPENDENT_AMBULATORY_CARE_PROVIDER_SITE_OTHER): Payer: Medicare Other | Admitting: Cardiology

## 2015-05-24 ENCOUNTER — Encounter: Payer: Self-pay | Admitting: Cardiology

## 2015-05-24 VITALS — BP 116/54 | HR 66 | Ht 74.0 in | Wt 235.0 lb

## 2015-05-24 DIAGNOSIS — R0602 Shortness of breath: Secondary | ICD-10-CM

## 2015-05-24 DIAGNOSIS — I509 Heart failure, unspecified: Secondary | ICD-10-CM | POA: Diagnosis not present

## 2015-05-24 LAB — BASIC METABOLIC PANEL
BUN: 13 mg/dL (ref 6–23)
CHLORIDE: 98 meq/L (ref 96–112)
CO2: 29 mEq/L (ref 19–32)
Calcium: 8.9 mg/dL (ref 8.4–10.5)
Creat: 0.92 mg/dL (ref 0.50–1.35)
Glucose, Bld: 265 mg/dL — ABNORMAL HIGH (ref 70–99)
Potassium: 4.7 mEq/L (ref 3.5–5.3)
Sodium: 140 mEq/L (ref 135–145)

## 2015-05-24 NOTE — Patient Instructions (Signed)
Your physician has requested that you have an echocardiogram. Echocardiography is a painless test that uses sound waves to create images of your heart. It provides your doctor with information about the size and shape of your heart and how well your heart's chambers and valves are working. This procedure takes approximately one hour. There are no restrictions for this procedure. This will be scheduled at our [address] office.  Your physician recommends that you return for lab work today. Low-Sodium Eating Plan Sodium raises blood pressure and causes water to be held in the body. Getting less sodium from food will help lower your blood pressure, reduce any swelling, and protect your heart, liver, and kidneys. We get sodium by adding salt (sodium chloride) to food. Most of our sodium comes from canned, boxed, and frozen foods. Restaurant foods, fast foods, and pizza are also very high in sodium. Even if you take medicine to lower your blood pressure or to reduce fluid in your body, getting less sodium from your food is important. WHAT IS MY PLAN? Most people should limit their sodium intake to 2,300 mg a day. Your health care provider recommends that you limit your sodium intake to __________ a day.  WHAT DO I NEED TO KNOW ABOUT THIS EATING PLAN? For the low-sodium eating plan, you will follow these general guidelines:  Choose foods with a % Daily Value for sodium of less than 5% (as listed on the food label).   Use salt-free seasonings or herbs instead of table salt or sea salt.   Check with your health care provider or pharmacist before using salt substitutes.   Eat fresh foods.  Eat more vegetables and fruits.  Limit canned vegetables. If you do use them, rinse them well to decrease the sodium.   Limit cheese to 1 oz (28 g) per day.   Eat lower-sodium products, often labeled as "lower sodium" or "no salt added."  Avoid foods that contain monosodium glutamate (MSG). MSG is  sometimes added to [place] food and some canned foods.  Check food labels (Nutrition Facts labels) on foods to learn how much sodium is in one serving.  Eat more home-cooked food and less restaurant, buffet, and fast food.  When eating at a restaurant, ask that your food be prepared with less salt or none, if possible.  HOW DO I READ FOOD LABELS FOR SODIUM INFORMATION? The Nutrition Facts label lists the amount of sodium in one serving of the food. If you eat more than one serving, you must multiply the listed amount of sodium by the number of servings. Food labels may also identify foods as:  Sodium free--Less than 5 mg in a serving.  Very low sodium--35 mg or less in a serving.  Low sodium--140 mg or less in a serving.  Light in sodium--50% less sodium in a serving. For example, if a food that usually has 300 mg of sodium is changed to become light in sodium, it will have 150 mg of sodium.  Reduced sodium--25% less sodium in a serving. For example, if a food that usually has 400 mg of sodium is changed to reduced sodium, it will have 300 mg of sodium. WHAT FOODS CAN I EAT? Grains Low-sodium cereals, including oats, puffed wheat and rice, and shredded wheat cereals. Low-sodium crackers. Unsalted rice and pasta. Lower-sodium bread.  Vegetables Frozen or fresh vegetables. Low-sodium or reduced-sodium canned vegetables. Low-sodium or reduced-sodium tomato sauce and paste. Low-sodium or reduced-sodium tomato and vegetable juices.  Fruits Fresh, frozen,  and canned fruit. Fruit juice.  Meat and Other Protein Products Low-sodium canned tuna and salmon. Fresh or frozen meat, poultry, seafood, and fish. Lamb. Unsalted nuts. Dried beans, peas, and lentils without added salt. Unsalted canned beans. Homemade soups without salt. Eggs.  Dairy Milk. Soy milk. Ricotta cheese. Low-sodium or reduced-sodium cheeses. Yogurt.  Condiments Fresh and dried herbs and spices. Salt-free seasonings.  Onion and garlic powders. Low-sodium varieties of mustard and ketchup. Lemon juice.  Fats and Oils Reduced-sodium salad dressings. Unsalted butter.  Other Unsalted popcorn and pretzels.  The items listed above may not be a complete list of recommended foods or beverages. Contact your dietitian for more options. WHAT FOODS ARE NOT RECOMMENDED? Grains Instant hot cereals. Bread stuffing, pancake, and biscuit mixes. Croutons. Seasoned rice or pasta mixes. Noodle soup cups. Boxed or frozen macaroni and cheese. Self-rising flour. Regular salted crackers. Vegetables Regular canned vegetables. Regular canned tomato sauce and paste. Regular tomato and vegetable juices. Frozen vegetables in sauces. Salted french fries. Olives. Angie Fava. Relishes. Sauerkraut. Salsa. Meat and Other Protein Products Salted, canned, smoked, spiced, or pickled meats, seafood, or fish. Bacon, ham, sausage, hot dogs, corned beef, chipped beef, and packaged luncheon meats. Salt pork. Jerky. Pickled herring. Anchovies, regular canned tuna, and sardines. Salted nuts. Dairy Processed cheese and cheese spreads. Cheese curds. Blue cheese and cottage cheese. Buttermilk.  Condiments Onion and garlic salt, seasoned salt, table salt, and sea salt. Canned and packaged gravies. Worcestershire sauce. Tartar sauce. Barbecue sauce. Teriyaki sauce. Soy sauce, including reduced sodium. Steak sauce. Fish sauce. Oyster sauce. Cocktail sauce. Horseradish. Regular ketchup and mustard. Meat flavorings and tenderizers. Bouillon cubes. Hot sauce. Tabasco sauce. Marinades. Taco seasonings. Relishes. Fats and Oils Regular salad dressings. Salted butter. Margarine. Ghee. Bacon fat.  Other Potato and tortilla chips. Corn chips and puffs. Salted popcorn and pretzels. Canned or dried soups. Pizza. Frozen entrees and pot pies.  The items listed above may not be a complete list of foods and beverages to avoid. Contact your dietitian for more  information. Document Released: 05/01/2002 Document Revised: 11/14/2013 Document Reviewed: 09/13/2013 [facility] Patient Information 2015 [place], [place]. This information is not intended to replace advice given to you by your health care provider. Make sure you discuss any questions you have with your health care provider.   Weigh daily Call [phone] if weight climbs more than 3 pounds in a day or 5 pounds in a week. No salt to very little salt in your diet.  No more than 2000 mg in a day. Call if increased shortness of breath or increased swelling.   Your physician recommends that you schedule a follow-up appointment in: 2-3 weeks with Dr. Cathie Olden or extender on a day that Dr. Cathie Olden is in the office.

## 2015-05-25 LAB — BRAIN NATRIURETIC PEPTIDE: BRAIN NATRIURETIC PEPTIDE: 31.2 pg/mL (ref 0.0–100.0)

## 2015-05-28 DIAGNOSIS — F0390 Unspecified dementia without behavioral disturbance: Secondary | ICD-10-CM | POA: Diagnosis not present

## 2015-05-28 DIAGNOSIS — Z9181 History of falling: Secondary | ICD-10-CM | POA: Diagnosis not present

## 2015-05-28 DIAGNOSIS — R41840 Attention and concentration deficit: Secondary | ICD-10-CM | POA: Diagnosis not present

## 2015-05-28 DIAGNOSIS — R609 Edema, unspecified: Secondary | ICD-10-CM | POA: Diagnosis not present

## 2015-05-28 DIAGNOSIS — M6281 Muscle weakness (generalized): Secondary | ICD-10-CM | POA: Diagnosis not present

## 2015-05-28 DIAGNOSIS — R41841 Cognitive communication deficit: Secondary | ICD-10-CM | POA: Diagnosis not present

## 2015-05-30 DIAGNOSIS — R41840 Attention and concentration deficit: Secondary | ICD-10-CM | POA: Diagnosis not present

## 2015-05-30 DIAGNOSIS — M6281 Muscle weakness (generalized): Secondary | ICD-10-CM | POA: Diagnosis not present

## 2015-05-30 DIAGNOSIS — R41841 Cognitive communication deficit: Secondary | ICD-10-CM | POA: Diagnosis not present

## 2015-05-30 DIAGNOSIS — F0390 Unspecified dementia without behavioral disturbance: Secondary | ICD-10-CM | POA: Diagnosis not present

## 2015-05-30 DIAGNOSIS — Z9181 History of falling: Secondary | ICD-10-CM | POA: Diagnosis not present

## 2015-05-31 ENCOUNTER — Ambulatory Visit (HOSPITAL_COMMUNITY)
Admission: RE | Admit: 2015-05-31 | Discharge: 2015-05-31 | Disposition: A | Discharge status: Home / Self Care | Payer: Medicare Other | Source: Ambulatory Visit | Attending: Cardiology | Admitting: Cardiology

## 2015-05-31 DIAGNOSIS — I509 Heart failure, unspecified: Secondary | ICD-10-CM

## 2015-05-31 DIAGNOSIS — R0602 Shortness of breath: Secondary | ICD-10-CM

## 2015-05-31 NOTE — Progress Notes (Signed)
2D Echocardiogram Complete.  05/31/2015   Lacorey Brusca, RDCS   This was a very technically difficult study.  [name] was unable to roll onto his left side, or move his left arm due to a shoulder injury.  He was also unable to cooperate much with breath holds.

## 2015-06-04 DIAGNOSIS — R41841 Cognitive communication deficit: Secondary | ICD-10-CM | POA: Diagnosis not present

## 2015-06-04 DIAGNOSIS — M6281 Muscle weakness (generalized): Secondary | ICD-10-CM | POA: Diagnosis not present

## 2015-06-04 DIAGNOSIS — Z9181 History of falling: Secondary | ICD-10-CM | POA: Diagnosis not present

## 2015-06-04 DIAGNOSIS — F0390 Unspecified dementia without behavioral disturbance: Secondary | ICD-10-CM | POA: Diagnosis not present

## 2015-06-04 DIAGNOSIS — R41840 Attention and concentration deficit: Secondary | ICD-10-CM | POA: Diagnosis not present

## 2015-06-06 DIAGNOSIS — R41841 Cognitive communication deficit: Secondary | ICD-10-CM | POA: Diagnosis not present

## 2015-06-06 DIAGNOSIS — R41840 Attention and concentration deficit: Secondary | ICD-10-CM | POA: Diagnosis not present

## 2015-06-06 DIAGNOSIS — Z9181 History of falling: Secondary | ICD-10-CM | POA: Diagnosis not present

## 2015-06-06 DIAGNOSIS — F0390 Unspecified dementia without behavioral disturbance: Secondary | ICD-10-CM | POA: Diagnosis not present

## 2015-06-06 DIAGNOSIS — M6281 Muscle weakness (generalized): Secondary | ICD-10-CM | POA: Diagnosis not present

## 2015-06-10 DIAGNOSIS — R41840 Attention and concentration deficit: Secondary | ICD-10-CM | POA: Diagnosis not present

## 2015-06-10 DIAGNOSIS — M6281 Muscle weakness (generalized): Secondary | ICD-10-CM | POA: Diagnosis not present

## 2015-06-10 DIAGNOSIS — Z9181 History of falling: Secondary | ICD-10-CM | POA: Diagnosis not present

## 2015-06-10 DIAGNOSIS — F0390 Unspecified dementia without behavioral disturbance: Secondary | ICD-10-CM | POA: Diagnosis not present

## 2015-06-10 DIAGNOSIS — R41841 Cognitive communication deficit: Secondary | ICD-10-CM | POA: Diagnosis not present

## 2015-06-11 DIAGNOSIS — F0390 Unspecified dementia without behavioral disturbance: Secondary | ICD-10-CM | POA: Diagnosis not present

## 2015-06-11 DIAGNOSIS — R41840 Attention and concentration deficit: Secondary | ICD-10-CM | POA: Diagnosis not present

## 2015-06-11 DIAGNOSIS — Z9181 History of falling: Secondary | ICD-10-CM | POA: Diagnosis not present

## 2015-06-11 DIAGNOSIS — M6281 Muscle weakness (generalized): Secondary | ICD-10-CM | POA: Diagnosis not present

## 2015-06-11 DIAGNOSIS — R41841 Cognitive communication deficit: Secondary | ICD-10-CM | POA: Diagnosis not present

## 2015-06-12 DIAGNOSIS — M6281 Muscle weakness (generalized): Secondary | ICD-10-CM | POA: Diagnosis not present

## 2015-06-12 DIAGNOSIS — R41841 Cognitive communication deficit: Secondary | ICD-10-CM | POA: Diagnosis not present

## 2015-06-12 DIAGNOSIS — R41840 Attention and concentration deficit: Secondary | ICD-10-CM | POA: Diagnosis not present

## 2015-06-12 DIAGNOSIS — F0390 Unspecified dementia without behavioral disturbance: Secondary | ICD-10-CM | POA: Diagnosis not present

## 2015-06-12 DIAGNOSIS — Z9181 History of falling: Secondary | ICD-10-CM | POA: Diagnosis not present

## 2015-06-13 DIAGNOSIS — F0390 Unspecified dementia without behavioral disturbance: Secondary | ICD-10-CM | POA: Diagnosis not present

## 2015-06-13 DIAGNOSIS — Z9181 History of falling: Secondary | ICD-10-CM | POA: Diagnosis not present

## 2015-06-13 DIAGNOSIS — R41840 Attention and concentration deficit: Secondary | ICD-10-CM | POA: Diagnosis not present

## 2015-06-13 DIAGNOSIS — M6281 Muscle weakness (generalized): Secondary | ICD-10-CM | POA: Diagnosis not present

## 2015-06-13 DIAGNOSIS — R41841 Cognitive communication deficit: Secondary | ICD-10-CM | POA: Diagnosis not present

## 2015-06-14 DIAGNOSIS — R41840 Attention and concentration deficit: Secondary | ICD-10-CM | POA: Diagnosis not present

## 2015-06-14 DIAGNOSIS — R41841 Cognitive communication deficit: Secondary | ICD-10-CM | POA: Diagnosis not present

## 2015-06-14 DIAGNOSIS — Z9181 History of falling: Secondary | ICD-10-CM | POA: Diagnosis not present

## 2015-06-14 DIAGNOSIS — M6281 Muscle weakness (generalized): Secondary | ICD-10-CM | POA: Diagnosis not present

## 2015-06-14 DIAGNOSIS — F0390 Unspecified dementia without behavioral disturbance: Secondary | ICD-10-CM | POA: Diagnosis not present

## 2015-06-17 DIAGNOSIS — F0390 Unspecified dementia without behavioral disturbance: Secondary | ICD-10-CM | POA: Diagnosis not present

## 2015-06-17 DIAGNOSIS — R41841 Cognitive communication deficit: Secondary | ICD-10-CM | POA: Diagnosis not present

## 2015-06-17 DIAGNOSIS — M6281 Muscle weakness (generalized): Secondary | ICD-10-CM | POA: Diagnosis not present

## 2015-06-17 DIAGNOSIS — Z9181 History of falling: Secondary | ICD-10-CM | POA: Diagnosis not present

## 2015-06-17 DIAGNOSIS — R41840 Attention and concentration deficit: Secondary | ICD-10-CM | POA: Diagnosis not present

## 2015-06-18 DIAGNOSIS — Z9181 History of falling: Secondary | ICD-10-CM | POA: Diagnosis not present

## 2015-06-18 DIAGNOSIS — M6281 Muscle weakness (generalized): Secondary | ICD-10-CM | POA: Diagnosis not present

## 2015-06-18 DIAGNOSIS — R41840 Attention and concentration deficit: Secondary | ICD-10-CM | POA: Diagnosis not present

## 2015-06-18 DIAGNOSIS — F0390 Unspecified dementia without behavioral disturbance: Secondary | ICD-10-CM | POA: Diagnosis not present

## 2015-06-18 DIAGNOSIS — R41841 Cognitive communication deficit: Secondary | ICD-10-CM | POA: Diagnosis not present

## 2015-06-19 DIAGNOSIS — R41840 Attention and concentration deficit: Secondary | ICD-10-CM | POA: Diagnosis not present

## 2015-06-19 DIAGNOSIS — R41841 Cognitive communication deficit: Secondary | ICD-10-CM | POA: Diagnosis not present

## 2015-06-19 DIAGNOSIS — M6281 Muscle weakness (generalized): Secondary | ICD-10-CM | POA: Diagnosis not present

## 2015-06-19 DIAGNOSIS — F0390 Unspecified dementia without behavioral disturbance: Secondary | ICD-10-CM | POA: Diagnosis not present

## 2015-06-19 DIAGNOSIS — Z9181 History of falling: Secondary | ICD-10-CM | POA: Diagnosis not present

## 2015-06-20 DIAGNOSIS — M6281 Muscle weakness (generalized): Secondary | ICD-10-CM | POA: Diagnosis not present

## 2015-06-20 DIAGNOSIS — Z9181 History of falling: Secondary | ICD-10-CM | POA: Diagnosis not present

## 2015-06-20 DIAGNOSIS — R41841 Cognitive communication deficit: Secondary | ICD-10-CM | POA: Diagnosis not present

## 2015-06-20 DIAGNOSIS — R41840 Attention and concentration deficit: Secondary | ICD-10-CM | POA: Diagnosis not present

## 2015-06-20 DIAGNOSIS — F0390 Unspecified dementia without behavioral disturbance: Secondary | ICD-10-CM | POA: Diagnosis not present

## 2015-06-21 DIAGNOSIS — R41840 Attention and concentration deficit: Secondary | ICD-10-CM | POA: Diagnosis not present

## 2015-06-21 DIAGNOSIS — R41841 Cognitive communication deficit: Secondary | ICD-10-CM | POA: Diagnosis not present

## 2015-06-21 DIAGNOSIS — E039 Hypothyroidism, unspecified: Secondary | ICD-10-CM | POA: Diagnosis not present

## 2015-06-21 DIAGNOSIS — F0390 Unspecified dementia without behavioral disturbance: Secondary | ICD-10-CM | POA: Diagnosis not present

## 2015-06-21 DIAGNOSIS — Z9181 History of falling: Secondary | ICD-10-CM | POA: Diagnosis not present

## 2015-06-21 DIAGNOSIS — M6281 Muscle weakness (generalized): Secondary | ICD-10-CM | POA: Diagnosis not present

## 2015-06-24 DIAGNOSIS — R41840 Attention and concentration deficit: Secondary | ICD-10-CM | POA: Diagnosis not present

## 2015-06-24 DIAGNOSIS — F0390 Unspecified dementia without behavioral disturbance: Secondary | ICD-10-CM | POA: Diagnosis not present

## 2015-06-24 DIAGNOSIS — R41841 Cognitive communication deficit: Secondary | ICD-10-CM | POA: Diagnosis not present

## 2015-06-24 DIAGNOSIS — Z9181 History of falling: Secondary | ICD-10-CM | POA: Diagnosis not present

## 2015-06-24 DIAGNOSIS — M6281 Muscle weakness (generalized): Secondary | ICD-10-CM | POA: Diagnosis not present

## 2015-06-25 DIAGNOSIS — Z9181 History of falling: Secondary | ICD-10-CM | POA: Diagnosis not present

## 2015-06-25 DIAGNOSIS — R41841 Cognitive communication deficit: Secondary | ICD-10-CM | POA: Diagnosis not present

## 2015-06-25 DIAGNOSIS — M6281 Muscle weakness (generalized): Secondary | ICD-10-CM | POA: Diagnosis not present

## 2015-06-25 DIAGNOSIS — R41840 Attention and concentration deficit: Secondary | ICD-10-CM | POA: Diagnosis not present

## 2015-06-25 DIAGNOSIS — F0390 Unspecified dementia without behavioral disturbance: Secondary | ICD-10-CM | POA: Diagnosis not present

## 2015-06-26 DIAGNOSIS — F0390 Unspecified dementia without behavioral disturbance: Secondary | ICD-10-CM | POA: Diagnosis not present

## 2015-06-26 DIAGNOSIS — Z9181 History of falling: Secondary | ICD-10-CM | POA: Diagnosis not present

## 2015-06-26 DIAGNOSIS — R41840 Attention and concentration deficit: Secondary | ICD-10-CM | POA: Diagnosis not present

## 2015-06-26 DIAGNOSIS — M6281 Muscle weakness (generalized): Secondary | ICD-10-CM | POA: Diagnosis not present

## 2015-06-26 DIAGNOSIS — R41841 Cognitive communication deficit: Secondary | ICD-10-CM | POA: Diagnosis not present

## 2015-06-27 DIAGNOSIS — R41841 Cognitive communication deficit: Secondary | ICD-10-CM | POA: Diagnosis not present

## 2015-06-27 DIAGNOSIS — R41840 Attention and concentration deficit: Secondary | ICD-10-CM | POA: Diagnosis not present

## 2015-06-27 DIAGNOSIS — Z9181 History of falling: Secondary | ICD-10-CM | POA: Diagnosis not present

## 2015-06-27 DIAGNOSIS — F0390 Unspecified dementia without behavioral disturbance: Secondary | ICD-10-CM | POA: Diagnosis not present

## 2015-06-27 DIAGNOSIS — M6281 Muscle weakness (generalized): Secondary | ICD-10-CM | POA: Diagnosis not present

## 2015-06-28 DIAGNOSIS — F0390 Unspecified dementia without behavioral disturbance: Secondary | ICD-10-CM | POA: Diagnosis not present

## 2015-06-28 DIAGNOSIS — R41840 Attention and concentration deficit: Secondary | ICD-10-CM | POA: Diagnosis not present

## 2015-06-28 DIAGNOSIS — M6281 Muscle weakness (generalized): Secondary | ICD-10-CM | POA: Diagnosis not present

## 2015-06-28 DIAGNOSIS — R41841 Cognitive communication deficit: Secondary | ICD-10-CM | POA: Diagnosis not present

## 2015-06-28 DIAGNOSIS — Z9181 History of falling: Secondary | ICD-10-CM | POA: Diagnosis not present

## 2015-07-01 DIAGNOSIS — R41841 Cognitive communication deficit: Secondary | ICD-10-CM | POA: Diagnosis not present

## 2015-07-01 DIAGNOSIS — Z9181 History of falling: Secondary | ICD-10-CM | POA: Diagnosis not present

## 2015-07-01 DIAGNOSIS — M6281 Muscle weakness (generalized): Secondary | ICD-10-CM | POA: Diagnosis not present

## 2015-07-01 DIAGNOSIS — F0390 Unspecified dementia without behavioral disturbance: Secondary | ICD-10-CM | POA: Diagnosis not present

## 2015-07-01 DIAGNOSIS — R41840 Attention and concentration deficit: Secondary | ICD-10-CM | POA: Diagnosis not present

## 2015-07-02 DIAGNOSIS — Z9181 History of falling: Secondary | ICD-10-CM | POA: Diagnosis not present

## 2015-07-02 DIAGNOSIS — M6281 Muscle weakness (generalized): Secondary | ICD-10-CM | POA: Diagnosis not present

## 2015-07-02 DIAGNOSIS — R41841 Cognitive communication deficit: Secondary | ICD-10-CM | POA: Diagnosis not present

## 2015-07-02 DIAGNOSIS — R41840 Attention and concentration deficit: Secondary | ICD-10-CM | POA: Diagnosis not present

## 2015-07-02 DIAGNOSIS — F0390 Unspecified dementia without behavioral disturbance: Secondary | ICD-10-CM | POA: Diagnosis not present

## 2015-07-02 NOTE — Progress Notes (Signed)
Cardiology Office Note   Date:  07/03/2015   ID:  [name], DOB 1927-11-21, MRN [id number]  PCP:  Jerlyn Ly, MD  Cardiologist:  Dr. Liam Rogers   Electrophysiologist:  n/a  Chief Complaint  Patient presents with  . Congestive Heart Failure     History of Present Illness: [name] is a 81 y.o. male with a hx of CAD status post prior stenting to the LAD, prostate CA status post radiation, diabetes, HL. LHC in 01/2007 demonstrated patent stent in the LAD and moderately severe stenosis in a small LCx. This is treated medically. Last seen by Dr. Acie Fredrickson in 2013  Patient was seen by Lyda Jester, PA-C 05/23/15 with lower extremity edema. He had a Chest x-ray with his PCP that was suggestive of congestive heart failure. His primary care physician and placed him on Lasix. Echocardiogram was obtained. This demonstrated normal LV function with mild diastolic dysfunction. BNP was noted to be normal. He returns for follow-up.  He lives at [facility].  Here with his daughter.  He is in a wheelchair.  He no longer walks due to balance issues.  He was falling quite a bit.  He has been working with PT.  Denies any SOB.  Denies orthopnea, PND.  It is not clear if his edema is any better with Lasix.  Denies significant cough or wheezing.  No syncope.  No chest pain.     Studies/Reports Reviewed Today:  Echo 05/31/15 EF 55-60%, normal wall motion, grade 1 diastolic dysfunction  LHC 01/2007 LM: Minor luminal irregularities LAD: Proximal stent patent with 20-25% ISR, D1 irregularities,  LCx:  80-90% stenosis at bifurcation of OM1, distal OM 2-2.25 mm RCA: 20-25% EF: 45-50% CONCLUSIONS: Coronary artery disease involving the left anterior descending artery and now the circumflex artery. He has a good long-term patency of the LAD stent. He has no worsening of his left circumflex stenosis. We will discuss these issues with the patient. The vessel is somewhat on the small side but would  discuss the possibility of a long 2.2 or 2.5 mm stent.   Past Medical History  Diagnosis Date  . Coronary artery disease   . Diabetes mellitus   . Hyperlipidemia   . Prostate cancer   . Osteoporosis     receiving reclast 5 mg IV yearly  . Urinary frequency     Past Surgical History  Procedure Laterality Date  . Cardiac catheterization  01/24/2007    EF 41%  . Cardiac catheterization  05/02/1999  . US echocardiography  07/07/2005    EF 55-60%  . Cardiovascular stress test  01/14/2007    EF 41%  . Coronary angioplasty with stent placement      LEFT ANTERIOR DESCENDING ARTERY AND DIAGONAL ARTERY     Current Outpatient Prescriptions  Medication Sig Dispense Refill  . acetaminophen (TYLENOL) 500 MG tablet Take 500 mg by mouth every 6 (six) hours as needed.    Marland Kitchen aspirin EC 81 MG tablet Take 81 mg by mouth daily.      Marland Kitchen bismuth subsalicylate (KAOPECTATE) 262 MG/15ML suspension Take 30 mLs by mouth every 6 (six) hours as needed.    . calcium citrate-vitamin D (CITRACAL+D) 315-200 MG-UNIT per tablet Take 1 tablet by mouth daily.      . cholecalciferol (VITAMIN D) 1000 UNITS tablet Take 1,000 Units by mouth daily.      Marland Kitchen CINNAMON PO Take 1,000 mg by mouth daily.      Marland Kitchen  clopidogrel (PLAVIX) 75 MG tablet Take 75 mg by mouth daily.    . clotrimazole (LOTRIMIN) 1 % cream Apply 1 application topically 2 (two) times daily.    . Cyanocobalamin (VITAMIN B 12) 100 MCG LOZG Take 1 tablet by mouth daily.    . divalproex (DEPAKOTE) 250 MG DR tablet Take 250 mg by mouth 2 (two) times daily.    Marland Kitchen escitalopram (LEXAPRO) 20 MG tablet Take 20 mg by mouth daily.    . fish oil-omega-3 fatty acids 1000 MG capsule Take 1 g by mouth daily.      . furosemide (LASIX) 20 MG tablet Take 20 mg by mouth daily.    Marland Kitchen galantamine (RAZADYNE ER) 16 MG 24 hr capsule Take 16 mg by mouth daily with breakfast.    . guaiFENesin (MUCINEX) 600 MG 12 hr tablet Take by mouth daily.    Marland Kitchen HUMALOG 100 UNIT/ML injection Inject 100  Units into the skin.   10  . hydrocortisone (ANUSOL-HC) 25 MG suppository Place 25 mg rectally as needed for hemorrhoids or itching.    . Insulin Glargine (LANTUS SOLOSTAR) 100 UNIT/ML Solostar Pen Inject 60 Units into the skin 2 (two) times daily.    . Insulin Lispro, Human, (HUMALOG [place]) Inject 18 Units into the skin 3 (three) times daily.     . isosorbide mononitrate (IMDUR) 30 MG 24 hr tablet Take 30 mg by mouth daily.    Marland Kitchen levothyroxine (SYNTHROID, LEVOTHROID) 75 MCG tablet Take 75 mcg by mouth daily before breakfast.    . metFORMIN (GLUCOPHAGE) 1000 MG tablet Take 1,000 mg by mouth 2 (two) times daily with a meal.    . mirabegron ER (MYRBETRIQ) 25 MG TB24 tablet Take 25 mg by mouth daily.    . Multiple Vitamin (MULTIVITAMIN) tablet Take 1 tablet by mouth daily.      . potassium chloride (MICRO-K) 10 MEQ CR capsule Take 10 mEq by mouth daily.    . rosuvastatin (CRESTOR) 20 MG tablet Take 20 mg by mouth daily.      . Tamsulosin HCl (FLOMAX) 0.4 MG CAPS Take 0.4 mg by mouth daily.      . traZODone (DESYREL) 50 MG tablet Take 50 mg by mouth at bedtime.     No current facility-administered medications for this visit.    Allergies:   Actos and Sulfonamide derivatives    Social History:  The patient  reports that he has never smoked. He does not have any smokeless tobacco history on file. He reports that he drinks alcohol. He reports that he does not use illicit drugs.   Family History:  The patient's family history includes Coronary artery disease in his father; Heart failure in his father.    ROS:   Please see the history of present illness.   Review of Systems  Neurological: Positive for loss of balance.  All other systems reviewed and are negative.     PHYSICAL EXAM: VS:  BP 108/54 mmHg  Pulse 57  Ht 6\' 2"  (1.88 m)  Wt 235 lb (106.595 kg)  BMI 30.16 kg/m2  SpO2 93%    Wt Readings from Last 3 Encounters:  07/03/15 235 lb (106.595 kg)  05/24/15 235 lb (106.595 kg)    07/05/12 214 lb (97.07 kg)     GEN: Well nourished, well developed, in no acute distress HEENT: normal Neck: no JVD at 90 degrees, no masses Cardiac:  Normal S1/S2, RRR; no murmur ,  no rubs or gallops, trace bilateral LE edema  Respiratory:  clear to auscultation bilaterally, no wheezing, rhonchi or rales. GI: soft, nontender, nondistended, + BS MS: no deformity or atrophy Skin: warm and dry  Neuro:  CNs II-XII intact, Strength and sensation are intact Psych: Normal affect   EKG:  EKG is not ordered today.  It demonstrates:   n/a   Recent Labs: 05/24/2015: BUN 13; Creat 0.92; Potassium 4.7; Sodium 140, 05/24/15: BNP 31.2   Lipid Panel No results found for: CHOL, TRIG, HDL, CHOLHDL, VLDL, LDLCALC, LDLDIRECT    ASSESSMENT AND PLAN:   1.  Edema:  Edema is likely more related to being sedentary. We do not have his CXR results.  He does have mild diastolic dysfunction on his Echo.  He may have an element of diastolic HF.  His BNP was normal when he saw Brittainy.  Creatinine was with Lasix 20 mg QD.  He can continue current Rx.  He was also seen by Dr. Liam Rogers.  We recommended leg elevation and compression stockings if he can tolerate.    2.  CAD:  No angina.  Continue ASA, Plavix, statin, nitrates.   3.  HTN:  Controlled.   4.  Hyperlipidemia:  Continue statin.     Medication Changes: Current medicines are reviewed at length with the patient today.  Concerns regarding medicines are as outlined above.  The following changes have been made:   Discontinued Medications   No medications on file   Modified Medications   No medications on file   New Prescriptions   No medications on file    Labs/ tests ordered today include:   No orders of the defined types were placed in this encounter.     Disposition:   FU with Dr. Liam Rogers 6 mos.    Signed, Versie Starks, MHS 07/03/2015 3:14 PM    [facility] [address], [place], [place]   [address] Phone: [phone]; Fax: [phone]

## 2015-07-03 ENCOUNTER — Ambulatory Visit (INDEPENDENT_AMBULATORY_CARE_PROVIDER_SITE_OTHER): Payer: Medicare Other | Admitting: Physician Assistant

## 2015-07-03 ENCOUNTER — Encounter: Payer: Self-pay | Admitting: Physician Assistant

## 2015-07-03 VITALS — BP 108/54 | HR 57 | Ht 74.0 in | Wt 235.0 lb

## 2015-07-03 DIAGNOSIS — I2510 Atherosclerotic heart disease of native coronary artery without angina pectoris: Secondary | ICD-10-CM

## 2015-07-03 DIAGNOSIS — R609 Edema, unspecified: Secondary | ICD-10-CM

## 2015-07-03 DIAGNOSIS — E785 Hyperlipidemia, unspecified: Secondary | ICD-10-CM

## 2015-07-03 DIAGNOSIS — I10 Essential (primary) hypertension: Secondary | ICD-10-CM

## 2015-07-03 NOTE — Patient Instructions (Signed)
Medication Instructions:  Your physician recommends that you continue on your current medications as directed. Please refer to the Current Medication list given to you today.   Labwork: NONE  Testing/Procedures: NONE  Follow-Up: Your physician wants you to follow-up in: [place] Acie Fredrickson. You will receive a reminder letter in the mail two months in advance. If you don't receive a letter, please call our office to schedule the follow-up appointment.   Any Other Special Instructions Will Be Listed Below (If Applicable).

## 2015-07-09 DIAGNOSIS — M6281 Muscle weakness (generalized): Secondary | ICD-10-CM | POA: Diagnosis not present

## 2015-07-09 DIAGNOSIS — F0390 Unspecified dementia without behavioral disturbance: Secondary | ICD-10-CM | POA: Diagnosis not present

## 2015-07-09 DIAGNOSIS — R4189 Other symptoms and signs involving cognitive functions and awareness: Secondary | ICD-10-CM | POA: Diagnosis not present

## 2015-07-09 DIAGNOSIS — R2681 Unsteadiness on feet: Secondary | ICD-10-CM | POA: Diagnosis not present

## 2015-07-09 DIAGNOSIS — Z9181 History of falling: Secondary | ICD-10-CM | POA: Diagnosis not present

## 2015-07-09 DIAGNOSIS — R2689 Other abnormalities of gait and mobility: Secondary | ICD-10-CM | POA: Diagnosis not present

## 2015-07-10 DIAGNOSIS — M6281 Muscle weakness (generalized): Secondary | ICD-10-CM | POA: Diagnosis not present

## 2015-07-10 DIAGNOSIS — R2689 Other abnormalities of gait and mobility: Secondary | ICD-10-CM | POA: Diagnosis not present

## 2015-07-10 DIAGNOSIS — R4189 Other symptoms and signs involving cognitive functions and awareness: Secondary | ICD-10-CM | POA: Diagnosis not present

## 2015-07-10 DIAGNOSIS — F0390 Unspecified dementia without behavioral disturbance: Secondary | ICD-10-CM | POA: Diagnosis not present

## 2015-07-10 DIAGNOSIS — R2681 Unsteadiness on feet: Secondary | ICD-10-CM | POA: Diagnosis not present

## 2015-07-10 DIAGNOSIS — Z9181 History of falling: Secondary | ICD-10-CM | POA: Diagnosis not present

## 2015-07-11 DIAGNOSIS — F0390 Unspecified dementia without behavioral disturbance: Secondary | ICD-10-CM | POA: Diagnosis not present

## 2015-07-11 DIAGNOSIS — Z9181 History of falling: Secondary | ICD-10-CM | POA: Diagnosis not present

## 2015-07-11 DIAGNOSIS — R2681 Unsteadiness on feet: Secondary | ICD-10-CM | POA: Diagnosis not present

## 2015-07-11 DIAGNOSIS — R2689 Other abnormalities of gait and mobility: Secondary | ICD-10-CM | POA: Diagnosis not present

## 2015-07-11 DIAGNOSIS — M6281 Muscle weakness (generalized): Secondary | ICD-10-CM | POA: Diagnosis not present

## 2015-07-11 DIAGNOSIS — R4189 Other symptoms and signs involving cognitive functions and awareness: Secondary | ICD-10-CM | POA: Diagnosis not present

## 2015-07-12 DIAGNOSIS — F0390 Unspecified dementia without behavioral disturbance: Secondary | ICD-10-CM | POA: Diagnosis not present

## 2015-07-12 DIAGNOSIS — R2689 Other abnormalities of gait and mobility: Secondary | ICD-10-CM | POA: Diagnosis not present

## 2015-07-12 DIAGNOSIS — R2681 Unsteadiness on feet: Secondary | ICD-10-CM | POA: Diagnosis not present

## 2015-07-12 DIAGNOSIS — Z9181 History of falling: Secondary | ICD-10-CM | POA: Diagnosis not present

## 2015-07-12 DIAGNOSIS — M6281 Muscle weakness (generalized): Secondary | ICD-10-CM | POA: Diagnosis not present

## 2015-07-12 DIAGNOSIS — R4189 Other symptoms and signs involving cognitive functions and awareness: Secondary | ICD-10-CM | POA: Diagnosis not present

## 2015-07-15 DIAGNOSIS — R2689 Other abnormalities of gait and mobility: Secondary | ICD-10-CM | POA: Diagnosis not present

## 2015-07-15 DIAGNOSIS — R2681 Unsteadiness on feet: Secondary | ICD-10-CM | POA: Diagnosis not present

## 2015-07-15 DIAGNOSIS — F0390 Unspecified dementia without behavioral disturbance: Secondary | ICD-10-CM | POA: Diagnosis not present

## 2015-07-15 DIAGNOSIS — R4189 Other symptoms and signs involving cognitive functions and awareness: Secondary | ICD-10-CM | POA: Diagnosis not present

## 2015-07-15 DIAGNOSIS — Z9181 History of falling: Secondary | ICD-10-CM | POA: Diagnosis not present

## 2015-07-15 DIAGNOSIS — M6281 Muscle weakness (generalized): Secondary | ICD-10-CM | POA: Diagnosis not present

## 2015-07-16 DIAGNOSIS — R2689 Other abnormalities of gait and mobility: Secondary | ICD-10-CM | POA: Diagnosis not present

## 2015-07-16 DIAGNOSIS — M6281 Muscle weakness (generalized): Secondary | ICD-10-CM | POA: Diagnosis not present

## 2015-07-16 DIAGNOSIS — Z9181 History of falling: Secondary | ICD-10-CM | POA: Diagnosis not present

## 2015-07-16 DIAGNOSIS — R4189 Other symptoms and signs involving cognitive functions and awareness: Secondary | ICD-10-CM | POA: Diagnosis not present

## 2015-07-16 DIAGNOSIS — F0390 Unspecified dementia without behavioral disturbance: Secondary | ICD-10-CM | POA: Diagnosis not present

## 2015-07-16 DIAGNOSIS — R2681 Unsteadiness on feet: Secondary | ICD-10-CM | POA: Diagnosis not present

## 2015-07-17 DIAGNOSIS — M6281 Muscle weakness (generalized): Secondary | ICD-10-CM | POA: Diagnosis not present

## 2015-07-17 DIAGNOSIS — R2689 Other abnormalities of gait and mobility: Secondary | ICD-10-CM | POA: Diagnosis not present

## 2015-07-17 DIAGNOSIS — R2681 Unsteadiness on feet: Secondary | ICD-10-CM | POA: Diagnosis not present

## 2015-07-17 DIAGNOSIS — R4189 Other symptoms and signs involving cognitive functions and awareness: Secondary | ICD-10-CM | POA: Diagnosis not present

## 2015-07-17 DIAGNOSIS — F0390 Unspecified dementia without behavioral disturbance: Secondary | ICD-10-CM | POA: Diagnosis not present

## 2015-07-17 DIAGNOSIS — Z9181 History of falling: Secondary | ICD-10-CM | POA: Diagnosis not present

## 2015-07-18 DIAGNOSIS — F0390 Unspecified dementia without behavioral disturbance: Secondary | ICD-10-CM | POA: Diagnosis not present

## 2015-07-18 DIAGNOSIS — R2689 Other abnormalities of gait and mobility: Secondary | ICD-10-CM | POA: Diagnosis not present

## 2015-07-18 DIAGNOSIS — R2681 Unsteadiness on feet: Secondary | ICD-10-CM | POA: Diagnosis not present

## 2015-07-18 DIAGNOSIS — R4189 Other symptoms and signs involving cognitive functions and awareness: Secondary | ICD-10-CM | POA: Diagnosis not present

## 2015-07-18 DIAGNOSIS — M6281 Muscle weakness (generalized): Secondary | ICD-10-CM | POA: Diagnosis not present

## 2015-07-18 DIAGNOSIS — Z9181 History of falling: Secondary | ICD-10-CM | POA: Diagnosis not present

## 2015-07-19 DIAGNOSIS — R2681 Unsteadiness on feet: Secondary | ICD-10-CM | POA: Diagnosis not present

## 2015-07-19 DIAGNOSIS — F0390 Unspecified dementia without behavioral disturbance: Secondary | ICD-10-CM | POA: Diagnosis not present

## 2015-07-19 DIAGNOSIS — M6281 Muscle weakness (generalized): Secondary | ICD-10-CM | POA: Diagnosis not present

## 2015-07-19 DIAGNOSIS — R2689 Other abnormalities of gait and mobility: Secondary | ICD-10-CM | POA: Diagnosis not present

## 2015-07-19 DIAGNOSIS — Z9181 History of falling: Secondary | ICD-10-CM | POA: Diagnosis not present

## 2015-07-19 DIAGNOSIS — R4189 Other symptoms and signs involving cognitive functions and awareness: Secondary | ICD-10-CM | POA: Diagnosis not present

## 2015-07-22 DIAGNOSIS — R4189 Other symptoms and signs involving cognitive functions and awareness: Secondary | ICD-10-CM | POA: Diagnosis not present

## 2015-07-22 DIAGNOSIS — R2681 Unsteadiness on feet: Secondary | ICD-10-CM | POA: Diagnosis not present

## 2015-07-22 DIAGNOSIS — F0390 Unspecified dementia without behavioral disturbance: Secondary | ICD-10-CM | POA: Diagnosis not present

## 2015-07-22 DIAGNOSIS — Z9181 History of falling: Secondary | ICD-10-CM | POA: Diagnosis not present

## 2015-07-22 DIAGNOSIS — M6281 Muscle weakness (generalized): Secondary | ICD-10-CM | POA: Diagnosis not present

## 2015-07-22 DIAGNOSIS — R2689 Other abnormalities of gait and mobility: Secondary | ICD-10-CM | POA: Diagnosis not present

## 2015-07-23 DIAGNOSIS — R2689 Other abnormalities of gait and mobility: Secondary | ICD-10-CM | POA: Diagnosis not present

## 2015-07-23 DIAGNOSIS — Z9181 History of falling: Secondary | ICD-10-CM | POA: Diagnosis not present

## 2015-07-23 DIAGNOSIS — R2681 Unsteadiness on feet: Secondary | ICD-10-CM | POA: Diagnosis not present

## 2015-07-23 DIAGNOSIS — M6281 Muscle weakness (generalized): Secondary | ICD-10-CM | POA: Diagnosis not present

## 2015-07-23 DIAGNOSIS — F0390 Unspecified dementia without behavioral disturbance: Secondary | ICD-10-CM | POA: Diagnosis not present

## 2015-07-23 DIAGNOSIS — R4189 Other symptoms and signs involving cognitive functions and awareness: Secondary | ICD-10-CM | POA: Diagnosis not present

## 2015-07-24 DIAGNOSIS — R2681 Unsteadiness on feet: Secondary | ICD-10-CM | POA: Diagnosis not present

## 2015-07-24 DIAGNOSIS — M6281 Muscle weakness (generalized): Secondary | ICD-10-CM | POA: Diagnosis not present

## 2015-07-24 DIAGNOSIS — F0390 Unspecified dementia without behavioral disturbance: Secondary | ICD-10-CM | POA: Diagnosis not present

## 2015-07-24 DIAGNOSIS — Z9181 History of falling: Secondary | ICD-10-CM | POA: Diagnosis not present

## 2015-07-24 DIAGNOSIS — R2689 Other abnormalities of gait and mobility: Secondary | ICD-10-CM | POA: Diagnosis not present

## 2015-07-24 DIAGNOSIS — R4189 Other symptoms and signs involving cognitive functions and awareness: Secondary | ICD-10-CM | POA: Diagnosis not present

## 2015-07-25 DIAGNOSIS — F0390 Unspecified dementia without behavioral disturbance: Secondary | ICD-10-CM | POA: Diagnosis not present

## 2015-07-25 DIAGNOSIS — M6281 Muscle weakness (generalized): Secondary | ICD-10-CM | POA: Diagnosis not present

## 2015-07-25 DIAGNOSIS — Z9181 History of falling: Secondary | ICD-10-CM | POA: Diagnosis not present

## 2015-07-25 DIAGNOSIS — R2689 Other abnormalities of gait and mobility: Secondary | ICD-10-CM | POA: Diagnosis not present

## 2015-07-25 DIAGNOSIS — R2681 Unsteadiness on feet: Secondary | ICD-10-CM | POA: Diagnosis not present

## 2015-07-25 DIAGNOSIS — R4189 Other symptoms and signs involving cognitive functions and awareness: Secondary | ICD-10-CM | POA: Diagnosis not present

## 2015-07-26 DIAGNOSIS — R2681 Unsteadiness on feet: Secondary | ICD-10-CM | POA: Diagnosis not present

## 2015-07-26 DIAGNOSIS — M6281 Muscle weakness (generalized): Secondary | ICD-10-CM | POA: Diagnosis not present

## 2015-07-26 DIAGNOSIS — R2689 Other abnormalities of gait and mobility: Secondary | ICD-10-CM | POA: Diagnosis not present

## 2015-07-26 DIAGNOSIS — R4189 Other symptoms and signs involving cognitive functions and awareness: Secondary | ICD-10-CM | POA: Diagnosis not present

## 2015-07-26 DIAGNOSIS — Z9181 History of falling: Secondary | ICD-10-CM | POA: Diagnosis not present

## 2015-07-26 DIAGNOSIS — F0390 Unspecified dementia without behavioral disturbance: Secondary | ICD-10-CM | POA: Diagnosis not present

## 2015-07-29 DIAGNOSIS — R2689 Other abnormalities of gait and mobility: Secondary | ICD-10-CM | POA: Diagnosis not present

## 2015-07-29 DIAGNOSIS — R4189 Other symptoms and signs involving cognitive functions and awareness: Secondary | ICD-10-CM | POA: Diagnosis not present

## 2015-07-29 DIAGNOSIS — R2681 Unsteadiness on feet: Secondary | ICD-10-CM | POA: Diagnosis not present

## 2015-07-29 DIAGNOSIS — F0390 Unspecified dementia without behavioral disturbance: Secondary | ICD-10-CM | POA: Diagnosis not present

## 2015-07-29 DIAGNOSIS — M6281 Muscle weakness (generalized): Secondary | ICD-10-CM | POA: Diagnosis not present

## 2015-07-29 DIAGNOSIS — Z9181 History of falling: Secondary | ICD-10-CM | POA: Diagnosis not present

## 2015-07-30 DIAGNOSIS — M6281 Muscle weakness (generalized): Secondary | ICD-10-CM | POA: Diagnosis not present

## 2015-07-30 DIAGNOSIS — R4189 Other symptoms and signs involving cognitive functions and awareness: Secondary | ICD-10-CM | POA: Diagnosis not present

## 2015-07-30 DIAGNOSIS — F0390 Unspecified dementia without behavioral disturbance: Secondary | ICD-10-CM | POA: Diagnosis not present

## 2015-07-30 DIAGNOSIS — R2681 Unsteadiness on feet: Secondary | ICD-10-CM | POA: Diagnosis not present

## 2015-07-30 DIAGNOSIS — Z9181 History of falling: Secondary | ICD-10-CM | POA: Diagnosis not present

## 2015-07-30 DIAGNOSIS — R2689 Other abnormalities of gait and mobility: Secondary | ICD-10-CM | POA: Diagnosis not present

## 2015-07-31 DIAGNOSIS — R2681 Unsteadiness on feet: Secondary | ICD-10-CM | POA: Diagnosis not present

## 2015-07-31 DIAGNOSIS — Z9181 History of falling: Secondary | ICD-10-CM | POA: Diagnosis not present

## 2015-07-31 DIAGNOSIS — R2689 Other abnormalities of gait and mobility: Secondary | ICD-10-CM | POA: Diagnosis not present

## 2015-07-31 DIAGNOSIS — M6281 Muscle weakness (generalized): Secondary | ICD-10-CM | POA: Diagnosis not present

## 2015-07-31 DIAGNOSIS — F0390 Unspecified dementia without behavioral disturbance: Secondary | ICD-10-CM | POA: Diagnosis not present

## 2015-07-31 DIAGNOSIS — R4189 Other symptoms and signs involving cognitive functions and awareness: Secondary | ICD-10-CM | POA: Diagnosis not present

## 2015-08-01 DIAGNOSIS — R4189 Other symptoms and signs involving cognitive functions and awareness: Secondary | ICD-10-CM | POA: Diagnosis not present

## 2015-08-01 DIAGNOSIS — Z9181 History of falling: Secondary | ICD-10-CM | POA: Diagnosis not present

## 2015-08-01 DIAGNOSIS — R2681 Unsteadiness on feet: Secondary | ICD-10-CM | POA: Diagnosis not present

## 2015-08-01 DIAGNOSIS — F0390 Unspecified dementia without behavioral disturbance: Secondary | ICD-10-CM | POA: Diagnosis not present

## 2015-08-01 DIAGNOSIS — M6281 Muscle weakness (generalized): Secondary | ICD-10-CM | POA: Diagnosis not present

## 2015-08-01 DIAGNOSIS — R2689 Other abnormalities of gait and mobility: Secondary | ICD-10-CM | POA: Diagnosis not present

## 2015-08-02 DIAGNOSIS — M6281 Muscle weakness (generalized): Secondary | ICD-10-CM | POA: Diagnosis not present

## 2015-08-02 DIAGNOSIS — R4189 Other symptoms and signs involving cognitive functions and awareness: Secondary | ICD-10-CM | POA: Diagnosis not present

## 2015-08-02 DIAGNOSIS — F0390 Unspecified dementia without behavioral disturbance: Secondary | ICD-10-CM | POA: Diagnosis not present

## 2015-08-02 DIAGNOSIS — R2689 Other abnormalities of gait and mobility: Secondary | ICD-10-CM | POA: Diagnosis not present

## 2015-08-02 DIAGNOSIS — R2681 Unsteadiness on feet: Secondary | ICD-10-CM | POA: Diagnosis not present

## 2015-08-02 DIAGNOSIS — Z9181 History of falling: Secondary | ICD-10-CM | POA: Diagnosis not present

## 2015-08-28 DIAGNOSIS — E119 Type 2 diabetes mellitus without complications: Secondary | ICD-10-CM | POA: Diagnosis not present

## 2015-08-28 DIAGNOSIS — I2510 Atherosclerotic heart disease of native coronary artery without angina pectoris: Secondary | ICD-10-CM | POA: Diagnosis not present

## 2015-08-28 DIAGNOSIS — F0390 Unspecified dementia without behavioral disturbance: Secondary | ICD-10-CM | POA: Diagnosis not present

## 2015-08-28 DIAGNOSIS — I10 Essential (primary) hypertension: Secondary | ICD-10-CM | POA: Diagnosis not present

## 2015-08-28 DIAGNOSIS — E559 Vitamin D deficiency, unspecified: Secondary | ICD-10-CM | POA: Diagnosis not present

## 2015-08-29 DIAGNOSIS — I10 Essential (primary) hypertension: Secondary | ICD-10-CM | POA: Diagnosis not present

## 2015-08-29 DIAGNOSIS — D72829 Elevated white blood cell count, unspecified: Secondary | ICD-10-CM | POA: Diagnosis not present

## 2015-08-29 DIAGNOSIS — I2510 Atherosclerotic heart disease of native coronary artery without angina pectoris: Secondary | ICD-10-CM | POA: Diagnosis not present

## 2015-09-06 DIAGNOSIS — R609 Edema, unspecified: Secondary | ICD-10-CM | POA: Diagnosis not present

## 2015-09-06 DIAGNOSIS — D72829 Elevated white blood cell count, unspecified: Secondary | ICD-10-CM | POA: Diagnosis not present

## 2015-09-16 DIAGNOSIS — N3281 Overactive bladder: Secondary | ICD-10-CM | POA: Diagnosis not present

## 2015-09-16 DIAGNOSIS — C61 Malignant neoplasm of prostate: Secondary | ICD-10-CM | POA: Diagnosis not present

## 2015-09-23 DIAGNOSIS — Z23 Encounter for immunization: Secondary | ICD-10-CM | POA: Diagnosis not present

## 2015-10-03 DIAGNOSIS — I8310 Varicose veins of unspecified lower extremity with inflammation: Secondary | ICD-10-CM | POA: Diagnosis not present

## 2015-10-03 DIAGNOSIS — I2510 Atherosclerotic heart disease of native coronary artery without angina pectoris: Secondary | ICD-10-CM | POA: Diagnosis not present

## 2015-10-03 DIAGNOSIS — I10 Essential (primary) hypertension: Secondary | ICD-10-CM | POA: Diagnosis not present

## 2015-10-03 DIAGNOSIS — E785 Hyperlipidemia, unspecified: Secondary | ICD-10-CM | POA: Diagnosis not present

## 2015-10-03 DIAGNOSIS — E039 Hypothyroidism, unspecified: Secondary | ICD-10-CM | POA: Diagnosis not present

## 2015-10-03 DIAGNOSIS — D72829 Elevated white blood cell count, unspecified: Secondary | ICD-10-CM | POA: Diagnosis not present

## 2015-10-03 DIAGNOSIS — Z6831 Body mass index (BMI) 31.0-31.9, adult: Secondary | ICD-10-CM | POA: Diagnosis not present

## 2015-10-03 DIAGNOSIS — C61 Malignant neoplasm of prostate: Secondary | ICD-10-CM | POA: Diagnosis not present

## 2015-10-03 DIAGNOSIS — E1159 Type 2 diabetes mellitus with other circulatory complications: Secondary | ICD-10-CM | POA: Diagnosis not present

## 2015-10-03 DIAGNOSIS — Z1389 Encounter for screening for other disorder: Secondary | ICD-10-CM | POA: Diagnosis not present

## 2015-10-03 DIAGNOSIS — F329 Major depressive disorder, single episode, unspecified: Secondary | ICD-10-CM | POA: Diagnosis not present

## 2015-11-14 DIAGNOSIS — D0462 Carcinoma in situ of skin of left upper limb, including shoulder: Secondary | ICD-10-CM | POA: Diagnosis not present

## 2015-11-14 DIAGNOSIS — L821 Other seborrheic keratosis: Secondary | ICD-10-CM | POA: Diagnosis not present

## 2015-11-14 DIAGNOSIS — I872 Venous insufficiency (chronic) (peripheral): Secondary | ICD-10-CM | POA: Diagnosis not present

## 2015-11-14 DIAGNOSIS — Z85828 Personal history of other malignant neoplasm of skin: Secondary | ICD-10-CM | POA: Diagnosis not present

## 2015-11-14 DIAGNOSIS — I8310 Varicose veins of unspecified lower extremity with inflammation: Secondary | ICD-10-CM | POA: Diagnosis not present

## 2015-11-14 DIAGNOSIS — D485 Neoplasm of uncertain behavior of skin: Secondary | ICD-10-CM | POA: Diagnosis not present

## 2015-11-15 DIAGNOSIS — I10 Essential (primary) hypertension: Secondary | ICD-10-CM | POA: Diagnosis not present

## 2015-11-15 DIAGNOSIS — I2510 Atherosclerotic heart disease of native coronary artery without angina pectoris: Secondary | ICD-10-CM | POA: Diagnosis not present

## 2015-12-03 DIAGNOSIS — F0281 Dementia in other diseases classified elsewhere with behavioral disturbance: Secondary | ICD-10-CM | POA: Diagnosis not present

## 2015-12-05 DIAGNOSIS — F0390 Unspecified dementia without behavioral disturbance: Secondary | ICD-10-CM | POA: Diagnosis not present

## 2015-12-06 DIAGNOSIS — E119 Type 2 diabetes mellitus without complications: Secondary | ICD-10-CM | POA: Diagnosis not present

## 2015-12-30 DIAGNOSIS — Z0000 Encounter for general adult medical examination without abnormal findings: Secondary | ICD-10-CM | POA: Diagnosis not present

## 2015-12-30 DIAGNOSIS — J029 Acute pharyngitis, unspecified: Secondary | ICD-10-CM | POA: Diagnosis not present

## 2015-12-30 DIAGNOSIS — F329 Major depressive disorder, single episode, unspecified: Secondary | ICD-10-CM | POA: Diagnosis not present

## 2015-12-30 DIAGNOSIS — E784 Other hyperlipidemia: Secondary | ICD-10-CM | POA: Diagnosis not present

## 2015-12-30 DIAGNOSIS — I8310 Varicose veins of unspecified lower extremity with inflammation: Secondary | ICD-10-CM | POA: Diagnosis not present

## 2015-12-30 DIAGNOSIS — E038 Other specified hypothyroidism: Secondary | ICD-10-CM | POA: Diagnosis not present

## 2015-12-30 DIAGNOSIS — I10 Essential (primary) hypertension: Secondary | ICD-10-CM | POA: Diagnosis not present

## 2015-12-30 DIAGNOSIS — R413 Other amnesia: Secondary | ICD-10-CM | POA: Diagnosis not present

## 2015-12-30 DIAGNOSIS — E1151 Type 2 diabetes mellitus with diabetic peripheral angiopathy without gangrene: Secondary | ICD-10-CM | POA: Diagnosis not present

## 2015-12-30 DIAGNOSIS — Z1389 Encounter for screening for other disorder: Secondary | ICD-10-CM | POA: Diagnosis not present

## 2015-12-30 DIAGNOSIS — Z6831 Body mass index (BMI) 31.0-31.9, adult: Secondary | ICD-10-CM | POA: Diagnosis not present

## 2015-12-30 DIAGNOSIS — R0600 Dyspnea, unspecified: Secondary | ICD-10-CM | POA: Diagnosis not present

## 2015-12-30 DIAGNOSIS — E1159 Type 2 diabetes mellitus with other circulatory complications: Secondary | ICD-10-CM | POA: Diagnosis not present

## 2015-12-30 DIAGNOSIS — F39 Unspecified mood [affective] disorder: Secondary | ICD-10-CM | POA: Diagnosis not present

## 2015-12-30 DIAGNOSIS — H9190 Unspecified hearing loss, unspecified ear: Secondary | ICD-10-CM | POA: Diagnosis not present

## 2015-12-30 DIAGNOSIS — D72829 Elevated white blood cell count, unspecified: Secondary | ICD-10-CM | POA: Diagnosis not present

## 2016-01-02 ENCOUNTER — Encounter: Payer: Self-pay | Admitting: Cardiovascular Disease

## 2016-01-02 ENCOUNTER — Ambulatory Visit (INDEPENDENT_AMBULATORY_CARE_PROVIDER_SITE_OTHER): Payer: Medicare Other | Admitting: Cardiovascular Disease

## 2016-01-02 VITALS — BP 94/50 | HR 68 | Ht 74.0 in

## 2016-01-02 DIAGNOSIS — I5032 Chronic diastolic (congestive) heart failure: Secondary | ICD-10-CM

## 2016-01-02 DIAGNOSIS — I25119 Atherosclerotic heart disease of native coronary artery with unspecified angina pectoris: Secondary | ICD-10-CM

## 2016-01-02 NOTE — Patient Instructions (Signed)
Medication Instructions:  Your physician recommends that you continue on your current medications as directed. Please refer to the Current Medication list given to you today.   Labwork: None ordered  Testing/Procedures: None Ordered  Follow-Up: Your physician wants you to follow-up in: 1 year with Dr. Acie Fredrickson.  You will receive a reminder letter in the mail two months in advance. If you don't receive a letter, please call our office to schedule the follow-up appointment.      If you need a refill on your cardiac medications before your next appointment, please call your pharmacy.

## 2016-01-02 NOTE — Progress Notes (Signed)
[name] Date of Birth  01-Feb-1927 [facility] [id number] [place], [place]  [address] [phone]  Fax  [phone]  Problem list: Coronary artery disease-status post stenting of his LAD and diagonal artery 2. Small stenosis in his circumflex marginal artery that has been treated medically 3. Prostate cancer-status post radiation therapy - [name]. 4. Diabetes mellitus 5. Hyperlipidemia 6. Status post significant spiral fracture of his left arm. This was managed conservatively because of the severity of the fracture.  History of Present Illness:  82 yo gentleman with a history of coronary artery disease. Status post PTCA and stenting of his left ear descending artery and diagonal artery. He has a small stenosis in the left circumflex flexor event has been treated medically.  Has a history of prostate cancer and has been treated with radiation.  His last PSA was 0. He has a history of hyperlipidemia and diabetes mellitus.  He denies any chest pain or shortness of breath.  Feb. 9, 2017:  Doing well.   Was seen with daughter , [name] Not in any pain .   No CP, no dyspnea Has had leg edema.    Is wearing support hose.  Has seen [name] last year Has lots lots of strength -  Does not walk anymore,  Uses a wheelchair.   Can transfer with help    Current Outpatient Prescriptions on File Prior to Visit  Medication Sig Dispense Refill  . acetaminophen (TYLENOL) 500 MG tablet Take 500 mg by mouth every 6 (six) hours as needed.    Marland Kitchen aspirin EC 81 MG tablet Take 81 mg by mouth daily.      Marland Kitchen bismuth subsalicylate (KAOPECTATE) 262 MG/15ML suspension Take 30 mLs by mouth every 6 (six) hours as needed.    . calcium citrate-vitamin D (CITRACAL+D) 315-200 MG-UNIT per tablet Take 1 tablet by mouth daily.      . cholecalciferol (VITAMIN D) 1000 UNITS tablet Take 1,000 Units by mouth daily.      Marland Kitchen CINNAMON PO Take 1,000 mg by mouth  daily.      . clopidogrel (PLAVIX) 75 MG tablet Take 75 mg by mouth daily.    . clotrimazole (LOTRIMIN) 1 % cream Apply 1 application topically 2 (two) times daily.    . Cyanocobalamin (VITAMIN B 12) 100 MCG LOZG Take 1 tablet by mouth daily.    . divalproex (DEPAKOTE) 250 MG DR tablet Take 250 mg by mouth 2 (two) times daily.    Marland Kitchen escitalopram (LEXAPRO) 20 MG tablet Take 20 mg by mouth daily.    . fish oil-omega-3 fatty acids 1000 MG capsule Take 1 g by mouth daily.      . furosemide (LASIX) 20 MG tablet Take 20 mg by mouth daily.    Marland Kitchen galantamine (RAZADYNE ER) 16 MG 24 hr capsule Take 16 mg by mouth daily with breakfast.    . guaiFENesin (MUCINEX) 600 MG 12 hr tablet Take by mouth daily.    Marland Kitchen HUMALOG 100 UNIT/ML injection Inject 100 Units into the skin.   10  . hydrocortisone (ANUSOL-HC) 25 MG suppository Place 25 mg rectally as needed for hemorrhoids or itching.    . Insulin Glargine (LANTUS SOLOSTAR) 100 UNIT/ML Solostar Pen Inject 60 Units into the skin 2 (two) times daily.    . Insulin Lispro, Human, (HUMALOG [place]) Inject 18 Units into the skin 3 (three) times daily.     Marland Kitchen  isosorbide mononitrate (IMDUR) 30 MG 24 hr tablet Take 30 mg by mouth daily.    Marland Kitchen levothyroxine (SYNTHROID, LEVOTHROID) 75 MCG tablet Take 75 mcg by mouth daily before breakfast.    . metFORMIN (GLUCOPHAGE) 1000 MG tablet Take 1,000 mg by mouth 2 (two) times daily with a meal.    . mirabegron ER (MYRBETRIQ) 25 MG TB24 tablet Take 25 mg by mouth daily.    . Multiple Vitamin (MULTIVITAMIN) tablet Take 1 tablet by mouth daily.      . potassium chloride (MICRO-K) 10 MEQ CR capsule Take 10 mEq by mouth daily.    . rosuvastatin (CRESTOR) 20 MG tablet Take 20 mg by mouth daily.      . Tamsulosin HCl (FLOMAX) 0.4 MG CAPS Take 0.4 mg by mouth daily.      . traZODone (DESYREL) 50 MG tablet Take 50 mg by mouth at bedtime.     No current facility-administered medications on file prior to visit.    Allergies  Allergen Reactions   . Actos [Pioglitazone Hydrochloride]     swelling around lips  . Sulfonamide Derivatives     Red streaks on legs    Past Medical History  Diagnosis Date  . Coronary artery disease   . Diabetes mellitus   . Hyperlipidemia   . Prostate cancer ([facility])   . Osteoporosis     receiving reclast 5 mg IV yearly  . Urinary frequency     Past Surgical History  Procedure Laterality Date  . Cardiac catheterization  01/24/2007    EF 41%  . Cardiac catheterization  05/02/1999  . US echocardiography  07/07/2005    EF 55-60%  . Cardiovascular stress test  01/14/2007    EF 41%  . Coronary angioplasty with stent placement      LEFT ANTERIOR DESCENDING ARTERY AND DIAGONAL ARTERY    History  Smoking status  . Never Smoker   Smokeless tobacco  . Not on file    History  Alcohol Use  . Yes    Comment: occassionally    Family History  Problem Relation Age of Onset  . Coronary artery disease Father   . Heart failure Father     Reviw of Systems:  Reviewed in the HPI.  All other systems are negative.  Physical Exam: BP 94/50 mmHg  Pulse 68  Ht 6\' 2"  (1.88 m)  Wt  The patient is alert and oriented x 3.  The mood and affect are normal.    Has slowed down a bit  Skin: warm and dry.  Color is normal.    HEENT:   the sclera are nonicteric.  The mucous membranes are moist.  The carotids are 2+ without bruits.  There is no thyromegaly.  There is no JVD.    Lungs: clear.  The chest wall is non tender.    Heart: regular rate with a normal S1 and S2.  There are no murmurs, gallops, or rubs. The PMI is not displaced.     Abdomen: good bowel sounds.  There is no guarding or rebound.  There is no hepatosplenomegaly or tenderness.  There are no masses.   Extremities:  Compression hose on .Marland Kitchen  Minimal - no edema   Neuro:  Cranial nerves II - XII are intact.  Motor and sensory functions are intact.    The gait is normal.  ECG:  Assessment / Plan:   1. Coronary artery disease: He is not  having any episodes of angina. Continue current  medications. He's currently on isosorbide. If his blood pressure drops any lower than I would discontinue the isosorbide.  2. Chronic diastolic congestive heart failure. He had an echo last year that revealed normal left ventricle systolic function. He does have some diastolic dysfunction. At this point he is very limited and is basically  wheelchair-bound.   Arelia Sneddon continue with very conservative therapy. Continue Lasix for leg edema.    Giuliano Preece, Wonda Cheng, MD  01/02/2016 5:34 PM    [facility] [place],  [place], [place]  [address] Pager [phone] Phone: [phone]; Fax: [phone]   [facility]  [address] [place], [place]  [address] [phone]   Fax [phone]

## 2016-01-06 DIAGNOSIS — C61 Malignant neoplasm of prostate: Secondary | ICD-10-CM | POA: Diagnosis not present

## 2016-01-09 DIAGNOSIS — Z85828 Personal history of other malignant neoplasm of skin: Secondary | ICD-10-CM | POA: Diagnosis not present

## 2016-01-09 DIAGNOSIS — L821 Other seborrheic keratosis: Secondary | ICD-10-CM | POA: Diagnosis not present

## 2016-01-09 DIAGNOSIS — L853 Xerosis cutis: Secondary | ICD-10-CM | POA: Diagnosis not present

## 2016-01-15 ENCOUNTER — Ambulatory Visit (HOSPITAL_COMMUNITY)
Admission: RE | Admit: 2016-01-15 | Discharge: 2016-01-15 | Disposition: A | Discharge status: Home / Self Care | Payer: Medicare Other | Source: Ambulatory Visit | Attending: Internal Medicine | Admitting: Internal Medicine

## 2016-01-15 ENCOUNTER — Other Ambulatory Visit (HOSPITAL_COMMUNITY): Payer: Self-pay | Admitting: Internal Medicine

## 2016-01-15 ENCOUNTER — Encounter (HOSPITAL_COMMUNITY): Payer: Self-pay

## 2016-01-15 DIAGNOSIS — M810 Age-related osteoporosis without current pathological fracture: Secondary | ICD-10-CM | POA: Insufficient documentation

## 2016-01-15 MED ORDER — ZOLEDRONIC ACID 5 MG/100ML IV SOLN
5.0000 mg | Freq: Once | INTRAVENOUS | Status: AC
  Administered 2016-01-15: 5 mg via INTRAVENOUS
  Filled 2016-01-15: qty 100

## 2016-01-15 MED ORDER — SODIUM CHLORIDE 0.9 % IV SOLN
Freq: Once | INTRAVENOUS | Status: AC
  Administered 2016-01-15: 250 mL via INTRAVENOUS

## 2016-01-15 NOTE — Discharge Instructions (Signed)
Drink fluids/water as tolerated over next 72hrs °Tylenol or Ibuprofen OTC as directed °Continue calcium and Vit D as directed by your MDZoledronic Acid injection (Paget's Disease, Osteoporosis) °What is this medicine? °ZOLEDRONIC ACID (ZOE le dron ik AS id) lowers the amount of calcium loss from bone. It is used to treat Paget's disease and osteoporosis in women. °This medicine may be used for other purposes; ask your health care provider or pharmacist if you have questions. °What should I tell my health care provider before I take this medicine? °They need to know if you have any of these conditions: °-aspirin-sensitive asthma °-cancer, especially if you are receiving medicines used to treat cancer °-dental disease or wear dentures °-infection °-kidney disease °-low levels of calcium in the blood °-past surgery on the parathyroid gland or intestines °-receiving corticosteroids like dexamethasone or prednisone °-an unusual or allergic reaction to zoledronic acid, other medicines, foods, dyes, or preservatives °-pregnant or trying to get pregnant °-breast-feeding °How should I use this medicine? °This medicine is for infusion into a vein. It is given by a health care professional in a hospital or clinic setting. °Talk to your pediatrician regarding the use of this medicine in children. This medicine is not approved for use in children. °Overdosage: If you think you have taken too much of this medicine contact a poison control center or emergency room at once. °NOTE: This medicine is only for you. Do not share this medicine with others. °What if I miss a dose? °It is important not to miss your dose. Call your doctor or health care professional if you are unable to keep an appointment. °What may interact with this medicine? °-certain antibiotics given by injection °-NSAIDs, medicines for pain and inflammation, like ibuprofen or naproxen °-some diuretics like bumetanide, furosemide °-teriparatide °This list may not  describe all possible interactions. Give your health care provider a list of all the medicines, herbs, non-prescription drugs, or dietary supplements you use. Also tell them if you smoke, drink alcohol, or use illegal drugs. Some items may interact with your medicine. °What should I watch for while using this medicine? °Visit your doctor or health care professional for regular checkups. It may be some time before you see the benefit from this medicine. Do not stop taking your medicine unless your doctor tells you to. Your doctor may order blood tests or other tests to see how you are doing. °Women should inform their doctor if they wish to become pregnant or think they might be pregnant. There is a potential for serious side effects to an unborn child. Talk to your health care professional or pharmacist for more information. °You should make sure that you get enough calcium and vitamin D while you are taking this medicine. Discuss the foods you eat and the vitamins you take with your health care professional. °Some people who take this medicine have severe bone, joint, and/or muscle pain. This medicine may also increase your risk for jaw problems or a broken thigh bone. Tell your doctor right away if you have severe pain in your jaw, bones, joints, or muscles. Tell your doctor if you have any pain that does not go away or that gets worse. °Tell your dentist and dental surgeon that you are taking this medicine. You should not have major dental surgery while on this medicine. See your dentist to have a dental exam and fix any dental problems before starting this medicine. Take good care of your teeth while on this medicine. Make sure you   see your dentist for regular follow-up appointments. °What side effects may I notice from receiving this medicine? °Side effects that you should report to your doctor or health care professional as soon as possible: °-allergic reactions like skin rash, itching or hives, swelling of  the face, lips, or tongue °-anxiety, confusion, or depression °-breathing problems °-changes in vision °-eye pain °-feeling faint or lightheaded, falls °-jaw pain, especially after dental work °-mouth sores °-muscle cramps, stiffness, or weakness °-redness, blistering, peeling or loosening of the skin, including inside the mouth °-trouble passing urine or change in the amount of urine °Side effects that usually do not require medical attention (report to your doctor or health care professional if they continue or are bothersome): °-bone, joint, or muscle pain °-constipation °-diarrhea °-fever °-hair loss °-irritation at site where injected °-loss of appetite °-nausea, vomiting °-stomach upset °-trouble sleeping °-trouble swallowing °-weak or tired °This list may not describe all possible side effects. Call your doctor for medical advice about side effects. You may report side effects to FDA at 1-800-FDA-1088. °Where should I keep my medicine? °This drug is given in a hospital or clinic and will not be stored at home. °NOTE: This sheet is a summary. It may not cover all possible information. If you have questions about this medicine, talk to your doctor, pharmacist, or health care provider. °  °© 2016, [employer]. (2014-04-07 14:19:57) ° °

## 2016-01-16 DIAGNOSIS — H33051 Total retinal detachment, right eye: Secondary | ICD-10-CM | POA: Diagnosis not present

## 2016-01-16 DIAGNOSIS — S0531XD Ocular laceration without prolapse or loss of intraocular tissue, right eye, subsequent encounter: Secondary | ICD-10-CM | POA: Diagnosis not present

## 2016-01-16 DIAGNOSIS — H43813 Vitreous degeneration, bilateral: Secondary | ICD-10-CM | POA: Diagnosis not present

## 2016-01-16 DIAGNOSIS — Z961 Presence of intraocular lens: Secondary | ICD-10-CM | POA: Diagnosis not present

## 2016-01-16 DIAGNOSIS — H33023 Retinal detachment with multiple breaks, bilateral: Secondary | ICD-10-CM | POA: Diagnosis not present

## 2016-02-22 DIAGNOSIS — R0602 Shortness of breath: Secondary | ICD-10-CM | POA: Diagnosis not present

## 2016-02-25 DIAGNOSIS — I369 Nonrheumatic tricuspid valve disorder, unspecified: Secondary | ICD-10-CM | POA: Diagnosis not present

## 2016-02-25 DIAGNOSIS — E559 Vitamin D deficiency, unspecified: Secondary | ICD-10-CM | POA: Diagnosis not present

## 2016-02-25 DIAGNOSIS — E119 Type 2 diabetes mellitus without complications: Secondary | ICD-10-CM | POA: Diagnosis not present

## 2016-02-25 DIAGNOSIS — D649 Anemia, unspecified: Secondary | ICD-10-CM | POA: Diagnosis not present

## 2016-02-25 DIAGNOSIS — I348 Other nonrheumatic mitral valve disorders: Secondary | ICD-10-CM | POA: Diagnosis not present

## 2016-02-25 DIAGNOSIS — E039 Hypothyroidism, unspecified: Secondary | ICD-10-CM | POA: Diagnosis not present

## 2016-02-25 DIAGNOSIS — F329 Major depressive disorder, single episode, unspecified: Secondary | ICD-10-CM | POA: Diagnosis not present

## 2016-02-25 DIAGNOSIS — I4891 Unspecified atrial fibrillation: Secondary | ICD-10-CM | POA: Diagnosis not present

## 2016-03-02 DIAGNOSIS — Z9181 History of falling: Secondary | ICD-10-CM | POA: Diagnosis not present

## 2016-03-02 DIAGNOSIS — R2681 Unsteadiness on feet: Secondary | ICD-10-CM | POA: Diagnosis not present

## 2016-03-02 DIAGNOSIS — R4189 Other symptoms and signs involving cognitive functions and awareness: Secondary | ICD-10-CM | POA: Diagnosis not present

## 2016-03-02 DIAGNOSIS — F0390 Unspecified dementia without behavioral disturbance: Secondary | ICD-10-CM | POA: Diagnosis not present

## 2016-03-02 DIAGNOSIS — M6281 Muscle weakness (generalized): Secondary | ICD-10-CM | POA: Diagnosis not present

## 2016-03-03 DIAGNOSIS — R4189 Other symptoms and signs involving cognitive functions and awareness: Secondary | ICD-10-CM | POA: Diagnosis not present

## 2016-03-03 DIAGNOSIS — R2681 Unsteadiness on feet: Secondary | ICD-10-CM | POA: Diagnosis not present

## 2016-03-03 DIAGNOSIS — F0390 Unspecified dementia without behavioral disturbance: Secondary | ICD-10-CM | POA: Diagnosis not present

## 2016-03-03 DIAGNOSIS — M6281 Muscle weakness (generalized): Secondary | ICD-10-CM | POA: Diagnosis not present

## 2016-03-03 DIAGNOSIS — Z9181 History of falling: Secondary | ICD-10-CM | POA: Diagnosis not present

## 2016-03-04 DIAGNOSIS — R2681 Unsteadiness on feet: Secondary | ICD-10-CM | POA: Diagnosis not present

## 2016-03-04 DIAGNOSIS — Z9181 History of falling: Secondary | ICD-10-CM | POA: Diagnosis not present

## 2016-03-04 DIAGNOSIS — R4189 Other symptoms and signs involving cognitive functions and awareness: Secondary | ICD-10-CM | POA: Diagnosis not present

## 2016-03-04 DIAGNOSIS — M6281 Muscle weakness (generalized): Secondary | ICD-10-CM | POA: Diagnosis not present

## 2016-03-04 DIAGNOSIS — F0390 Unspecified dementia without behavioral disturbance: Secondary | ICD-10-CM | POA: Diagnosis not present

## 2016-03-05 DIAGNOSIS — F0390 Unspecified dementia without behavioral disturbance: Secondary | ICD-10-CM | POA: Diagnosis not present

## 2016-03-05 DIAGNOSIS — Z9181 History of falling: Secondary | ICD-10-CM | POA: Diagnosis not present

## 2016-03-05 DIAGNOSIS — R2681 Unsteadiness on feet: Secondary | ICD-10-CM | POA: Diagnosis not present

## 2016-03-05 DIAGNOSIS — R4189 Other symptoms and signs involving cognitive functions and awareness: Secondary | ICD-10-CM | POA: Diagnosis not present

## 2016-03-05 DIAGNOSIS — M6281 Muscle weakness (generalized): Secondary | ICD-10-CM | POA: Diagnosis not present

## 2016-03-06 DIAGNOSIS — R2681 Unsteadiness on feet: Secondary | ICD-10-CM | POA: Diagnosis not present

## 2016-03-06 DIAGNOSIS — M6281 Muscle weakness (generalized): Secondary | ICD-10-CM | POA: Diagnosis not present

## 2016-03-06 DIAGNOSIS — F0390 Unspecified dementia without behavioral disturbance: Secondary | ICD-10-CM | POA: Diagnosis not present

## 2016-03-06 DIAGNOSIS — Z9181 History of falling: Secondary | ICD-10-CM | POA: Diagnosis not present

## 2016-03-06 DIAGNOSIS — R4189 Other symptoms and signs involving cognitive functions and awareness: Secondary | ICD-10-CM | POA: Diagnosis not present

## 2016-03-09 DIAGNOSIS — Z9181 History of falling: Secondary | ICD-10-CM | POA: Diagnosis not present

## 2016-03-09 DIAGNOSIS — R2681 Unsteadiness on feet: Secondary | ICD-10-CM | POA: Diagnosis not present

## 2016-03-09 DIAGNOSIS — R4189 Other symptoms and signs involving cognitive functions and awareness: Secondary | ICD-10-CM | POA: Diagnosis not present

## 2016-03-09 DIAGNOSIS — M6281 Muscle weakness (generalized): Secondary | ICD-10-CM | POA: Diagnosis not present

## 2016-03-09 DIAGNOSIS — F0390 Unspecified dementia without behavioral disturbance: Secondary | ICD-10-CM | POA: Diagnosis not present

## 2016-03-10 DIAGNOSIS — R2681 Unsteadiness on feet: Secondary | ICD-10-CM | POA: Diagnosis not present

## 2016-03-10 DIAGNOSIS — Z9181 History of falling: Secondary | ICD-10-CM | POA: Diagnosis not present

## 2016-03-10 DIAGNOSIS — F0390 Unspecified dementia without behavioral disturbance: Secondary | ICD-10-CM | POA: Diagnosis not present

## 2016-03-10 DIAGNOSIS — N481 Balanitis: Secondary | ICD-10-CM | POA: Diagnosis not present

## 2016-03-10 DIAGNOSIS — R4189 Other symptoms and signs involving cognitive functions and awareness: Secondary | ICD-10-CM | POA: Diagnosis not present

## 2016-03-10 DIAGNOSIS — M6281 Muscle weakness (generalized): Secondary | ICD-10-CM | POA: Diagnosis not present

## 2016-03-11 DIAGNOSIS — R4189 Other symptoms and signs involving cognitive functions and awareness: Secondary | ICD-10-CM | POA: Diagnosis not present

## 2016-03-11 DIAGNOSIS — Z9181 History of falling: Secondary | ICD-10-CM | POA: Diagnosis not present

## 2016-03-11 DIAGNOSIS — M6281 Muscle weakness (generalized): Secondary | ICD-10-CM | POA: Diagnosis not present

## 2016-03-11 DIAGNOSIS — R2681 Unsteadiness on feet: Secondary | ICD-10-CM | POA: Diagnosis not present

## 2016-03-11 DIAGNOSIS — F0390 Unspecified dementia without behavioral disturbance: Secondary | ICD-10-CM | POA: Diagnosis not present

## 2016-03-12 DIAGNOSIS — Z9181 History of falling: Secondary | ICD-10-CM | POA: Diagnosis not present

## 2016-03-12 DIAGNOSIS — R4189 Other symptoms and signs involving cognitive functions and awareness: Secondary | ICD-10-CM | POA: Diagnosis not present

## 2016-03-12 DIAGNOSIS — M6281 Muscle weakness (generalized): Secondary | ICD-10-CM | POA: Diagnosis not present

## 2016-03-12 DIAGNOSIS — R2681 Unsteadiness on feet: Secondary | ICD-10-CM | POA: Diagnosis not present

## 2016-03-12 DIAGNOSIS — F0390 Unspecified dementia without behavioral disturbance: Secondary | ICD-10-CM | POA: Diagnosis not present

## 2016-03-13 DIAGNOSIS — R2681 Unsteadiness on feet: Secondary | ICD-10-CM | POA: Diagnosis not present

## 2016-03-13 DIAGNOSIS — F0390 Unspecified dementia without behavioral disturbance: Secondary | ICD-10-CM | POA: Diagnosis not present

## 2016-03-13 DIAGNOSIS — Z9181 History of falling: Secondary | ICD-10-CM | POA: Diagnosis not present

## 2016-03-13 DIAGNOSIS — M6281 Muscle weakness (generalized): Secondary | ICD-10-CM | POA: Diagnosis not present

## 2016-03-13 DIAGNOSIS — R4189 Other symptoms and signs involving cognitive functions and awareness: Secondary | ICD-10-CM | POA: Diagnosis not present

## 2016-03-16 DIAGNOSIS — F0390 Unspecified dementia without behavioral disturbance: Secondary | ICD-10-CM | POA: Diagnosis not present

## 2016-03-16 DIAGNOSIS — Z9181 History of falling: Secondary | ICD-10-CM | POA: Diagnosis not present

## 2016-03-16 DIAGNOSIS — M6281 Muscle weakness (generalized): Secondary | ICD-10-CM | POA: Diagnosis not present

## 2016-03-16 DIAGNOSIS — R4189 Other symptoms and signs involving cognitive functions and awareness: Secondary | ICD-10-CM | POA: Diagnosis not present

## 2016-03-16 DIAGNOSIS — R2681 Unsteadiness on feet: Secondary | ICD-10-CM | POA: Diagnosis not present

## 2016-03-17 DIAGNOSIS — R4189 Other symptoms and signs involving cognitive functions and awareness: Secondary | ICD-10-CM | POA: Diagnosis not present

## 2016-03-17 DIAGNOSIS — M6281 Muscle weakness (generalized): Secondary | ICD-10-CM | POA: Diagnosis not present

## 2016-03-17 DIAGNOSIS — R2681 Unsteadiness on feet: Secondary | ICD-10-CM | POA: Diagnosis not present

## 2016-03-17 DIAGNOSIS — Z9181 History of falling: Secondary | ICD-10-CM | POA: Diagnosis not present

## 2016-03-17 DIAGNOSIS — F0390 Unspecified dementia without behavioral disturbance: Secondary | ICD-10-CM | POA: Diagnosis not present

## 2016-03-18 DIAGNOSIS — F0390 Unspecified dementia without behavioral disturbance: Secondary | ICD-10-CM | POA: Diagnosis not present

## 2016-03-18 DIAGNOSIS — R4189 Other symptoms and signs involving cognitive functions and awareness: Secondary | ICD-10-CM | POA: Diagnosis not present

## 2016-03-18 DIAGNOSIS — M6281 Muscle weakness (generalized): Secondary | ICD-10-CM | POA: Diagnosis not present

## 2016-03-18 DIAGNOSIS — R2681 Unsteadiness on feet: Secondary | ICD-10-CM | POA: Diagnosis not present

## 2016-03-18 DIAGNOSIS — Z9181 History of falling: Secondary | ICD-10-CM | POA: Diagnosis not present

## 2016-03-19 DIAGNOSIS — M6281 Muscle weakness (generalized): Secondary | ICD-10-CM | POA: Diagnosis not present

## 2016-03-19 DIAGNOSIS — Z9181 History of falling: Secondary | ICD-10-CM | POA: Diagnosis not present

## 2016-03-19 DIAGNOSIS — R2681 Unsteadiness on feet: Secondary | ICD-10-CM | POA: Diagnosis not present

## 2016-03-19 DIAGNOSIS — F0390 Unspecified dementia without behavioral disturbance: Secondary | ICD-10-CM | POA: Diagnosis not present

## 2016-03-19 DIAGNOSIS — R4189 Other symptoms and signs involving cognitive functions and awareness: Secondary | ICD-10-CM | POA: Diagnosis not present

## 2016-03-20 DIAGNOSIS — R2681 Unsteadiness on feet: Secondary | ICD-10-CM | POA: Diagnosis not present

## 2016-03-20 DIAGNOSIS — F0390 Unspecified dementia without behavioral disturbance: Secondary | ICD-10-CM | POA: Diagnosis not present

## 2016-03-20 DIAGNOSIS — Z9181 History of falling: Secondary | ICD-10-CM | POA: Diagnosis not present

## 2016-03-20 DIAGNOSIS — R4189 Other symptoms and signs involving cognitive functions and awareness: Secondary | ICD-10-CM | POA: Diagnosis not present

## 2016-03-20 DIAGNOSIS — M6281 Muscle weakness (generalized): Secondary | ICD-10-CM | POA: Diagnosis not present

## 2016-03-21 DIAGNOSIS — M6281 Muscle weakness (generalized): Secondary | ICD-10-CM | POA: Diagnosis not present

## 2016-03-21 DIAGNOSIS — R2681 Unsteadiness on feet: Secondary | ICD-10-CM | POA: Diagnosis not present

## 2016-03-21 DIAGNOSIS — F0390 Unspecified dementia without behavioral disturbance: Secondary | ICD-10-CM | POA: Diagnosis not present

## 2016-03-21 DIAGNOSIS — R4189 Other symptoms and signs involving cognitive functions and awareness: Secondary | ICD-10-CM | POA: Diagnosis not present

## 2016-03-21 DIAGNOSIS — Z9181 History of falling: Secondary | ICD-10-CM | POA: Diagnosis not present

## 2016-03-23 DIAGNOSIS — R2681 Unsteadiness on feet: Secondary | ICD-10-CM | POA: Diagnosis not present

## 2016-03-23 DIAGNOSIS — R4189 Other symptoms and signs involving cognitive functions and awareness: Secondary | ICD-10-CM | POA: Diagnosis not present

## 2016-03-23 DIAGNOSIS — F0390 Unspecified dementia without behavioral disturbance: Secondary | ICD-10-CM | POA: Diagnosis not present

## 2016-03-23 DIAGNOSIS — Z9181 History of falling: Secondary | ICD-10-CM | POA: Diagnosis not present

## 2016-03-23 DIAGNOSIS — M6281 Muscle weakness (generalized): Secondary | ICD-10-CM | POA: Diagnosis not present

## 2016-03-24 DIAGNOSIS — R2681 Unsteadiness on feet: Secondary | ICD-10-CM | POA: Diagnosis not present

## 2016-03-24 DIAGNOSIS — M6281 Muscle weakness (generalized): Secondary | ICD-10-CM | POA: Diagnosis not present

## 2016-03-24 DIAGNOSIS — F0390 Unspecified dementia without behavioral disturbance: Secondary | ICD-10-CM | POA: Diagnosis not present

## 2016-03-24 DIAGNOSIS — R4189 Other symptoms and signs involving cognitive functions and awareness: Secondary | ICD-10-CM | POA: Diagnosis not present

## 2016-03-24 DIAGNOSIS — Z9181 History of falling: Secondary | ICD-10-CM | POA: Diagnosis not present

## 2016-03-25 DIAGNOSIS — F0390 Unspecified dementia without behavioral disturbance: Secondary | ICD-10-CM | POA: Diagnosis not present

## 2016-03-25 DIAGNOSIS — M6281 Muscle weakness (generalized): Secondary | ICD-10-CM | POA: Diagnosis not present

## 2016-03-25 DIAGNOSIS — R4189 Other symptoms and signs involving cognitive functions and awareness: Secondary | ICD-10-CM | POA: Diagnosis not present

## 2016-03-25 DIAGNOSIS — R2681 Unsteadiness on feet: Secondary | ICD-10-CM | POA: Diagnosis not present

## 2016-03-25 DIAGNOSIS — Z9181 History of falling: Secondary | ICD-10-CM | POA: Diagnosis not present

## 2016-04-24 DIAGNOSIS — F329 Major depressive disorder, single episode, unspecified: Secondary | ICD-10-CM | POA: Diagnosis not present

## 2016-04-24 DIAGNOSIS — I2510 Atherosclerotic heart disease of native coronary artery without angina pectoris: Secondary | ICD-10-CM | POA: Diagnosis not present

## 2016-04-24 DIAGNOSIS — E785 Hyperlipidemia, unspecified: Secondary | ICD-10-CM | POA: Diagnosis not present

## 2016-04-24 DIAGNOSIS — N400 Enlarged prostate without lower urinary tract symptoms: Secondary | ICD-10-CM | POA: Diagnosis not present

## 2016-04-27 DIAGNOSIS — E119 Type 2 diabetes mellitus without complications: Secondary | ICD-10-CM | POA: Diagnosis not present

## 2016-05-20 DIAGNOSIS — F329 Major depressive disorder, single episode, unspecified: Secondary | ICD-10-CM | POA: Diagnosis not present

## 2016-05-20 DIAGNOSIS — J209 Acute bronchitis, unspecified: Secondary | ICD-10-CM | POA: Diagnosis not present

## 2016-05-20 DIAGNOSIS — F0390 Unspecified dementia without behavioral disturbance: Secondary | ICD-10-CM | POA: Diagnosis not present

## 2016-05-20 DIAGNOSIS — E039 Hypothyroidism, unspecified: Secondary | ICD-10-CM | POA: Diagnosis not present

## 2016-06-01 DIAGNOSIS — Z85828 Personal history of other malignant neoplasm of skin: Secondary | ICD-10-CM | POA: Diagnosis not present

## 2016-06-01 DIAGNOSIS — L570 Actinic keratosis: Secondary | ICD-10-CM | POA: Diagnosis not present

## 2016-06-01 DIAGNOSIS — L820 Inflamed seborrheic keratosis: Secondary | ICD-10-CM | POA: Diagnosis not present

## 2016-06-08 DIAGNOSIS — B372 Candidiasis of skin and nail: Secondary | ICD-10-CM | POA: Diagnosis not present

## 2016-06-17 DIAGNOSIS — M810 Age-related osteoporosis without current pathological fracture: Secondary | ICD-10-CM | POA: Diagnosis not present

## 2016-06-17 DIAGNOSIS — E1159 Type 2 diabetes mellitus with other circulatory complications: Secondary | ICD-10-CM | POA: Diagnosis not present

## 2016-06-17 DIAGNOSIS — E038 Other specified hypothyroidism: Secondary | ICD-10-CM | POA: Diagnosis not present

## 2016-06-17 DIAGNOSIS — F329 Major depressive disorder, single episode, unspecified: Secondary | ICD-10-CM | POA: Diagnosis not present

## 2016-06-17 DIAGNOSIS — C61 Malignant neoplasm of prostate: Secondary | ICD-10-CM | POA: Diagnosis not present

## 2016-06-17 DIAGNOSIS — E784 Other hyperlipidemia: Secondary | ICD-10-CM | POA: Diagnosis not present

## 2016-06-17 DIAGNOSIS — Z1389 Encounter for screening for other disorder: Secondary | ICD-10-CM | POA: Diagnosis not present

## 2016-06-17 DIAGNOSIS — I10 Essential (primary) hypertension: Secondary | ICD-10-CM | POA: Diagnosis not present

## 2016-06-17 DIAGNOSIS — D72829 Elevated white blood cell count, unspecified: Secondary | ICD-10-CM | POA: Diagnosis not present

## 2016-06-17 DIAGNOSIS — I2510 Atherosclerotic heart disease of native coronary artery without angina pectoris: Secondary | ICD-10-CM | POA: Diagnosis not present

## 2016-07-01 DIAGNOSIS — R2681 Unsteadiness on feet: Secondary | ICD-10-CM | POA: Diagnosis not present

## 2016-07-01 DIAGNOSIS — Z9181 History of falling: Secondary | ICD-10-CM | POA: Diagnosis not present

## 2016-07-01 DIAGNOSIS — I509 Heart failure, unspecified: Secondary | ICD-10-CM | POA: Diagnosis not present

## 2016-07-01 DIAGNOSIS — M6281 Muscle weakness (generalized): Secondary | ICD-10-CM | POA: Diagnosis not present

## 2016-07-01 DIAGNOSIS — R279 Unspecified lack of coordination: Secondary | ICD-10-CM | POA: Diagnosis not present

## 2016-07-01 DIAGNOSIS — R54 Age-related physical debility: Secondary | ICD-10-CM | POA: Diagnosis not present

## 2016-07-02 DIAGNOSIS — I509 Heart failure, unspecified: Secondary | ICD-10-CM | POA: Diagnosis not present

## 2016-07-02 DIAGNOSIS — R54 Age-related physical debility: Secondary | ICD-10-CM | POA: Diagnosis not present

## 2016-07-02 DIAGNOSIS — Z9181 History of falling: Secondary | ICD-10-CM | POA: Diagnosis not present

## 2016-07-02 DIAGNOSIS — M6281 Muscle weakness (generalized): Secondary | ICD-10-CM | POA: Diagnosis not present

## 2016-07-02 DIAGNOSIS — R2681 Unsteadiness on feet: Secondary | ICD-10-CM | POA: Diagnosis not present

## 2016-07-02 DIAGNOSIS — R279 Unspecified lack of coordination: Secondary | ICD-10-CM | POA: Diagnosis not present

## 2016-07-06 DIAGNOSIS — R279 Unspecified lack of coordination: Secondary | ICD-10-CM | POA: Diagnosis not present

## 2016-07-06 DIAGNOSIS — I509 Heart failure, unspecified: Secondary | ICD-10-CM | POA: Diagnosis not present

## 2016-07-06 DIAGNOSIS — R54 Age-related physical debility: Secondary | ICD-10-CM | POA: Diagnosis not present

## 2016-07-06 DIAGNOSIS — Z9181 History of falling: Secondary | ICD-10-CM | POA: Diagnosis not present

## 2016-07-06 DIAGNOSIS — M6281 Muscle weakness (generalized): Secondary | ICD-10-CM | POA: Diagnosis not present

## 2016-07-06 DIAGNOSIS — R2681 Unsteadiness on feet: Secondary | ICD-10-CM | POA: Diagnosis not present

## 2016-07-07 DIAGNOSIS — I2510 Atherosclerotic heart disease of native coronary artery without angina pectoris: Secondary | ICD-10-CM | POA: Diagnosis not present

## 2016-07-07 DIAGNOSIS — I872 Venous insufficiency (chronic) (peripheral): Secondary | ICD-10-CM | POA: Diagnosis not present

## 2016-07-07 DIAGNOSIS — E119 Type 2 diabetes mellitus without complications: Secondary | ICD-10-CM | POA: Diagnosis not present

## 2016-07-07 DIAGNOSIS — F323 Major depressive disorder, single episode, severe with psychotic features: Secondary | ICD-10-CM | POA: Diagnosis not present

## 2016-07-08 DIAGNOSIS — Z9181 History of falling: Secondary | ICD-10-CM | POA: Diagnosis not present

## 2016-07-08 DIAGNOSIS — R279 Unspecified lack of coordination: Secondary | ICD-10-CM | POA: Diagnosis not present

## 2016-07-08 DIAGNOSIS — M6281 Muscle weakness (generalized): Secondary | ICD-10-CM | POA: Diagnosis not present

## 2016-07-08 DIAGNOSIS — R54 Age-related physical debility: Secondary | ICD-10-CM | POA: Diagnosis not present

## 2016-07-08 DIAGNOSIS — R2681 Unsteadiness on feet: Secondary | ICD-10-CM | POA: Diagnosis not present

## 2016-07-08 DIAGNOSIS — I509 Heart failure, unspecified: Secondary | ICD-10-CM | POA: Diagnosis not present

## 2016-07-09 DIAGNOSIS — I509 Heart failure, unspecified: Secondary | ICD-10-CM | POA: Diagnosis not present

## 2016-07-09 DIAGNOSIS — R279 Unspecified lack of coordination: Secondary | ICD-10-CM | POA: Diagnosis not present

## 2016-07-09 DIAGNOSIS — M6281 Muscle weakness (generalized): Secondary | ICD-10-CM | POA: Diagnosis not present

## 2016-07-09 DIAGNOSIS — R54 Age-related physical debility: Secondary | ICD-10-CM | POA: Diagnosis not present

## 2016-07-09 DIAGNOSIS — Z9181 History of falling: Secondary | ICD-10-CM | POA: Diagnosis not present

## 2016-07-09 DIAGNOSIS — R2681 Unsteadiness on feet: Secondary | ICD-10-CM | POA: Diagnosis not present

## 2016-07-13 DIAGNOSIS — M6281 Muscle weakness (generalized): Secondary | ICD-10-CM | POA: Diagnosis not present

## 2016-07-13 DIAGNOSIS — R279 Unspecified lack of coordination: Secondary | ICD-10-CM | POA: Diagnosis not present

## 2016-07-13 DIAGNOSIS — R2681 Unsteadiness on feet: Secondary | ICD-10-CM | POA: Diagnosis not present

## 2016-07-13 DIAGNOSIS — I509 Heart failure, unspecified: Secondary | ICD-10-CM | POA: Diagnosis not present

## 2016-07-13 DIAGNOSIS — R54 Age-related physical debility: Secondary | ICD-10-CM | POA: Diagnosis not present

## 2016-07-13 DIAGNOSIS — Z9181 History of falling: Secondary | ICD-10-CM | POA: Diagnosis not present

## 2016-07-15 DIAGNOSIS — R279 Unspecified lack of coordination: Secondary | ICD-10-CM | POA: Diagnosis not present

## 2016-07-15 DIAGNOSIS — R2681 Unsteadiness on feet: Secondary | ICD-10-CM | POA: Diagnosis not present

## 2016-07-15 DIAGNOSIS — Z9181 History of falling: Secondary | ICD-10-CM | POA: Diagnosis not present

## 2016-07-15 DIAGNOSIS — R54 Age-related physical debility: Secondary | ICD-10-CM | POA: Diagnosis not present

## 2016-07-15 DIAGNOSIS — I509 Heart failure, unspecified: Secondary | ICD-10-CM | POA: Diagnosis not present

## 2016-07-15 DIAGNOSIS — M6281 Muscle weakness (generalized): Secondary | ICD-10-CM | POA: Diagnosis not present

## 2016-07-16 DIAGNOSIS — R279 Unspecified lack of coordination: Secondary | ICD-10-CM | POA: Diagnosis not present

## 2016-07-16 DIAGNOSIS — M6281 Muscle weakness (generalized): Secondary | ICD-10-CM | POA: Diagnosis not present

## 2016-07-16 DIAGNOSIS — Z9181 History of falling: Secondary | ICD-10-CM | POA: Diagnosis not present

## 2016-07-16 DIAGNOSIS — R54 Age-related physical debility: Secondary | ICD-10-CM | POA: Diagnosis not present

## 2016-07-16 DIAGNOSIS — I509 Heart failure, unspecified: Secondary | ICD-10-CM | POA: Diagnosis not present

## 2016-07-16 DIAGNOSIS — R2681 Unsteadiness on feet: Secondary | ICD-10-CM | POA: Diagnosis not present

## 2016-07-20 DIAGNOSIS — M6281 Muscle weakness (generalized): Secondary | ICD-10-CM | POA: Diagnosis not present

## 2016-07-20 DIAGNOSIS — R54 Age-related physical debility: Secondary | ICD-10-CM | POA: Diagnosis not present

## 2016-07-20 DIAGNOSIS — R279 Unspecified lack of coordination: Secondary | ICD-10-CM | POA: Diagnosis not present

## 2016-07-20 DIAGNOSIS — I509 Heart failure, unspecified: Secondary | ICD-10-CM | POA: Diagnosis not present

## 2016-07-20 DIAGNOSIS — R062 Wheezing: Secondary | ICD-10-CM | POA: Diagnosis not present

## 2016-07-20 DIAGNOSIS — R2681 Unsteadiness on feet: Secondary | ICD-10-CM | POA: Diagnosis not present

## 2016-07-20 DIAGNOSIS — Z9181 History of falling: Secondary | ICD-10-CM | POA: Diagnosis not present

## 2016-07-22 DIAGNOSIS — R2681 Unsteadiness on feet: Secondary | ICD-10-CM | POA: Diagnosis not present

## 2016-07-22 DIAGNOSIS — R279 Unspecified lack of coordination: Secondary | ICD-10-CM | POA: Diagnosis not present

## 2016-07-22 DIAGNOSIS — R54 Age-related physical debility: Secondary | ICD-10-CM | POA: Diagnosis not present

## 2016-07-22 DIAGNOSIS — N3281 Overactive bladder: Secondary | ICD-10-CM | POA: Diagnosis not present

## 2016-07-22 DIAGNOSIS — I509 Heart failure, unspecified: Secondary | ICD-10-CM | POA: Diagnosis not present

## 2016-07-22 DIAGNOSIS — M6281 Muscle weakness (generalized): Secondary | ICD-10-CM | POA: Diagnosis not present

## 2016-07-22 DIAGNOSIS — C61 Malignant neoplasm of prostate: Secondary | ICD-10-CM | POA: Diagnosis not present

## 2016-07-22 DIAGNOSIS — Z9181 History of falling: Secondary | ICD-10-CM | POA: Diagnosis not present

## 2016-07-22 DIAGNOSIS — N401 Enlarged prostate with lower urinary tract symptoms: Secondary | ICD-10-CM | POA: Diagnosis not present

## 2016-07-23 DIAGNOSIS — M6281 Muscle weakness (generalized): Secondary | ICD-10-CM | POA: Diagnosis not present

## 2016-07-23 DIAGNOSIS — I509 Heart failure, unspecified: Secondary | ICD-10-CM | POA: Diagnosis not present

## 2016-07-23 DIAGNOSIS — R2681 Unsteadiness on feet: Secondary | ICD-10-CM | POA: Diagnosis not present

## 2016-07-23 DIAGNOSIS — Z9181 History of falling: Secondary | ICD-10-CM | POA: Diagnosis not present

## 2016-07-23 DIAGNOSIS — R279 Unspecified lack of coordination: Secondary | ICD-10-CM | POA: Diagnosis not present

## 2016-07-23 DIAGNOSIS — R54 Age-related physical debility: Secondary | ICD-10-CM | POA: Diagnosis not present

## 2016-07-24 DIAGNOSIS — I369 Nonrheumatic tricuspid valve disorder, unspecified: Secondary | ICD-10-CM | POA: Diagnosis not present

## 2016-07-24 DIAGNOSIS — E039 Hypothyroidism, unspecified: Secondary | ICD-10-CM | POA: Diagnosis not present

## 2016-07-24 DIAGNOSIS — I10 Essential (primary) hypertension: Secondary | ICD-10-CM | POA: Diagnosis not present

## 2016-07-24 DIAGNOSIS — I348 Other nonrheumatic mitral valve disorders: Secondary | ICD-10-CM | POA: Diagnosis not present

## 2016-07-24 DIAGNOSIS — I4891 Unspecified atrial fibrillation: Secondary | ICD-10-CM | POA: Diagnosis not present

## 2016-07-24 DIAGNOSIS — F0390 Unspecified dementia without behavioral disturbance: Secondary | ICD-10-CM | POA: Diagnosis not present

## 2016-07-27 DIAGNOSIS — R2681 Unsteadiness on feet: Secondary | ICD-10-CM | POA: Diagnosis not present

## 2016-07-27 DIAGNOSIS — Z9181 History of falling: Secondary | ICD-10-CM | POA: Diagnosis not present

## 2016-07-27 DIAGNOSIS — R279 Unspecified lack of coordination: Secondary | ICD-10-CM | POA: Diagnosis not present

## 2016-07-27 DIAGNOSIS — R54 Age-related physical debility: Secondary | ICD-10-CM | POA: Diagnosis not present

## 2016-07-27 DIAGNOSIS — R293 Abnormal posture: Secondary | ICD-10-CM | POA: Diagnosis not present

## 2016-07-27 DIAGNOSIS — I509 Heart failure, unspecified: Secondary | ICD-10-CM | POA: Diagnosis not present

## 2016-07-27 DIAGNOSIS — M6281 Muscle weakness (generalized): Secondary | ICD-10-CM | POA: Diagnosis not present

## 2016-07-28 DIAGNOSIS — Z9181 History of falling: Secondary | ICD-10-CM | POA: Diagnosis not present

## 2016-07-28 DIAGNOSIS — R279 Unspecified lack of coordination: Secondary | ICD-10-CM | POA: Diagnosis not present

## 2016-07-28 DIAGNOSIS — I509 Heart failure, unspecified: Secondary | ICD-10-CM | POA: Diagnosis not present

## 2016-07-28 DIAGNOSIS — R2681 Unsteadiness on feet: Secondary | ICD-10-CM | POA: Diagnosis not present

## 2016-07-28 DIAGNOSIS — M6281 Muscle weakness (generalized): Secondary | ICD-10-CM | POA: Diagnosis not present

## 2016-07-28 DIAGNOSIS — R54 Age-related physical debility: Secondary | ICD-10-CM | POA: Diagnosis not present

## 2016-07-30 DIAGNOSIS — M6281 Muscle weakness (generalized): Secondary | ICD-10-CM | POA: Diagnosis not present

## 2016-07-30 DIAGNOSIS — R2681 Unsteadiness on feet: Secondary | ICD-10-CM | POA: Diagnosis not present

## 2016-07-30 DIAGNOSIS — R54 Age-related physical debility: Secondary | ICD-10-CM | POA: Diagnosis not present

## 2016-07-30 DIAGNOSIS — R279 Unspecified lack of coordination: Secondary | ICD-10-CM | POA: Diagnosis not present

## 2016-07-30 DIAGNOSIS — I509 Heart failure, unspecified: Secondary | ICD-10-CM | POA: Diagnosis not present

## 2016-07-30 DIAGNOSIS — Z9181 History of falling: Secondary | ICD-10-CM | POA: Diagnosis not present

## 2016-07-31 DIAGNOSIS — I509 Heart failure, unspecified: Secondary | ICD-10-CM | POA: Diagnosis not present

## 2016-07-31 DIAGNOSIS — R279 Unspecified lack of coordination: Secondary | ICD-10-CM | POA: Diagnosis not present

## 2016-07-31 DIAGNOSIS — M6281 Muscle weakness (generalized): Secondary | ICD-10-CM | POA: Diagnosis not present

## 2016-07-31 DIAGNOSIS — Z9181 History of falling: Secondary | ICD-10-CM | POA: Diagnosis not present

## 2016-07-31 DIAGNOSIS — R54 Age-related physical debility: Secondary | ICD-10-CM | POA: Diagnosis not present

## 2016-07-31 DIAGNOSIS — R2681 Unsteadiness on feet: Secondary | ICD-10-CM | POA: Diagnosis not present

## 2016-08-03 DIAGNOSIS — R279 Unspecified lack of coordination: Secondary | ICD-10-CM | POA: Diagnosis not present

## 2016-08-03 DIAGNOSIS — R54 Age-related physical debility: Secondary | ICD-10-CM | POA: Diagnosis not present

## 2016-08-03 DIAGNOSIS — M6281 Muscle weakness (generalized): Secondary | ICD-10-CM | POA: Diagnosis not present

## 2016-08-03 DIAGNOSIS — Z9181 History of falling: Secondary | ICD-10-CM | POA: Diagnosis not present

## 2016-08-03 DIAGNOSIS — R2681 Unsteadiness on feet: Secondary | ICD-10-CM | POA: Diagnosis not present

## 2016-08-03 DIAGNOSIS — I509 Heart failure, unspecified: Secondary | ICD-10-CM | POA: Diagnosis not present

## 2016-08-04 DIAGNOSIS — I509 Heart failure, unspecified: Secondary | ICD-10-CM | POA: Diagnosis not present

## 2016-08-04 DIAGNOSIS — R2681 Unsteadiness on feet: Secondary | ICD-10-CM | POA: Diagnosis not present

## 2016-08-04 DIAGNOSIS — R54 Age-related physical debility: Secondary | ICD-10-CM | POA: Diagnosis not present

## 2016-08-04 DIAGNOSIS — R279 Unspecified lack of coordination: Secondary | ICD-10-CM | POA: Diagnosis not present

## 2016-08-04 DIAGNOSIS — Z9181 History of falling: Secondary | ICD-10-CM | POA: Diagnosis not present

## 2016-08-04 DIAGNOSIS — M6281 Muscle weakness (generalized): Secondary | ICD-10-CM | POA: Diagnosis not present

## 2016-08-05 DIAGNOSIS — R2681 Unsteadiness on feet: Secondary | ICD-10-CM | POA: Diagnosis not present

## 2016-08-05 DIAGNOSIS — I509 Heart failure, unspecified: Secondary | ICD-10-CM | POA: Diagnosis not present

## 2016-08-05 DIAGNOSIS — R279 Unspecified lack of coordination: Secondary | ICD-10-CM | POA: Diagnosis not present

## 2016-08-05 DIAGNOSIS — M6281 Muscle weakness (generalized): Secondary | ICD-10-CM | POA: Diagnosis not present

## 2016-08-05 DIAGNOSIS — R54 Age-related physical debility: Secondary | ICD-10-CM | POA: Diagnosis not present

## 2016-08-05 DIAGNOSIS — Z9181 History of falling: Secondary | ICD-10-CM | POA: Diagnosis not present

## 2016-08-06 DIAGNOSIS — R2681 Unsteadiness on feet: Secondary | ICD-10-CM | POA: Diagnosis not present

## 2016-08-06 DIAGNOSIS — R54 Age-related physical debility: Secondary | ICD-10-CM | POA: Diagnosis not present

## 2016-08-06 DIAGNOSIS — R279 Unspecified lack of coordination: Secondary | ICD-10-CM | POA: Diagnosis not present

## 2016-08-06 DIAGNOSIS — Z9181 History of falling: Secondary | ICD-10-CM | POA: Diagnosis not present

## 2016-08-06 DIAGNOSIS — I509 Heart failure, unspecified: Secondary | ICD-10-CM | POA: Diagnosis not present

## 2016-08-06 DIAGNOSIS — M6281 Muscle weakness (generalized): Secondary | ICD-10-CM | POA: Diagnosis not present

## 2016-08-10 DIAGNOSIS — R279 Unspecified lack of coordination: Secondary | ICD-10-CM | POA: Diagnosis not present

## 2016-08-10 DIAGNOSIS — I509 Heart failure, unspecified: Secondary | ICD-10-CM | POA: Diagnosis not present

## 2016-08-10 DIAGNOSIS — R2681 Unsteadiness on feet: Secondary | ICD-10-CM | POA: Diagnosis not present

## 2016-08-10 DIAGNOSIS — Z9181 History of falling: Secondary | ICD-10-CM | POA: Diagnosis not present

## 2016-08-10 DIAGNOSIS — R54 Age-related physical debility: Secondary | ICD-10-CM | POA: Diagnosis not present

## 2016-08-10 DIAGNOSIS — M6281 Muscle weakness (generalized): Secondary | ICD-10-CM | POA: Diagnosis not present

## 2016-08-12 DIAGNOSIS — Z9181 History of falling: Secondary | ICD-10-CM | POA: Diagnosis not present

## 2016-08-12 DIAGNOSIS — M6281 Muscle weakness (generalized): Secondary | ICD-10-CM | POA: Diagnosis not present

## 2016-08-12 DIAGNOSIS — R54 Age-related physical debility: Secondary | ICD-10-CM | POA: Diagnosis not present

## 2016-08-12 DIAGNOSIS — R2681 Unsteadiness on feet: Secondary | ICD-10-CM | POA: Diagnosis not present

## 2016-08-12 DIAGNOSIS — I509 Heart failure, unspecified: Secondary | ICD-10-CM | POA: Diagnosis not present

## 2016-08-12 DIAGNOSIS — R279 Unspecified lack of coordination: Secondary | ICD-10-CM | POA: Diagnosis not present

## 2016-08-14 DIAGNOSIS — R2681 Unsteadiness on feet: Secondary | ICD-10-CM | POA: Diagnosis not present

## 2016-08-14 DIAGNOSIS — R54 Age-related physical debility: Secondary | ICD-10-CM | POA: Diagnosis not present

## 2016-08-14 DIAGNOSIS — Z9181 History of falling: Secondary | ICD-10-CM | POA: Diagnosis not present

## 2016-08-14 DIAGNOSIS — I509 Heart failure, unspecified: Secondary | ICD-10-CM | POA: Diagnosis not present

## 2016-08-14 DIAGNOSIS — M6281 Muscle weakness (generalized): Secondary | ICD-10-CM | POA: Diagnosis not present

## 2016-08-14 DIAGNOSIS — R279 Unspecified lack of coordination: Secondary | ICD-10-CM | POA: Diagnosis not present

## 2016-08-17 DIAGNOSIS — M6281 Muscle weakness (generalized): Secondary | ICD-10-CM | POA: Diagnosis not present

## 2016-08-17 DIAGNOSIS — I509 Heart failure, unspecified: Secondary | ICD-10-CM | POA: Diagnosis not present

## 2016-08-17 DIAGNOSIS — R2681 Unsteadiness on feet: Secondary | ICD-10-CM | POA: Diagnosis not present

## 2016-08-17 DIAGNOSIS — R279 Unspecified lack of coordination: Secondary | ICD-10-CM | POA: Diagnosis not present

## 2016-08-17 DIAGNOSIS — Z9181 History of falling: Secondary | ICD-10-CM | POA: Diagnosis not present

## 2016-08-17 DIAGNOSIS — R54 Age-related physical debility: Secondary | ICD-10-CM | POA: Diagnosis not present

## 2016-08-19 DIAGNOSIS — M6281 Muscle weakness (generalized): Secondary | ICD-10-CM | POA: Diagnosis not present

## 2016-08-19 DIAGNOSIS — R279 Unspecified lack of coordination: Secondary | ICD-10-CM | POA: Diagnosis not present

## 2016-08-19 DIAGNOSIS — R2681 Unsteadiness on feet: Secondary | ICD-10-CM | POA: Diagnosis not present

## 2016-08-19 DIAGNOSIS — R54 Age-related physical debility: Secondary | ICD-10-CM | POA: Diagnosis not present

## 2016-08-19 DIAGNOSIS — Z9181 History of falling: Secondary | ICD-10-CM | POA: Diagnosis not present

## 2016-08-19 DIAGNOSIS — I509 Heart failure, unspecified: Secondary | ICD-10-CM | POA: Diagnosis not present

## 2016-08-20 DIAGNOSIS — I509 Heart failure, unspecified: Secondary | ICD-10-CM | POA: Diagnosis not present

## 2016-08-20 DIAGNOSIS — R279 Unspecified lack of coordination: Secondary | ICD-10-CM | POA: Diagnosis not present

## 2016-08-20 DIAGNOSIS — R2681 Unsteadiness on feet: Secondary | ICD-10-CM | POA: Diagnosis not present

## 2016-08-20 DIAGNOSIS — M6281 Muscle weakness (generalized): Secondary | ICD-10-CM | POA: Diagnosis not present

## 2016-08-20 DIAGNOSIS — Z9181 History of falling: Secondary | ICD-10-CM | POA: Diagnosis not present

## 2016-08-20 DIAGNOSIS — R54 Age-related physical debility: Secondary | ICD-10-CM | POA: Diagnosis not present

## 2016-08-21 DIAGNOSIS — R2681 Unsteadiness on feet: Secondary | ICD-10-CM | POA: Diagnosis not present

## 2016-08-21 DIAGNOSIS — M6281 Muscle weakness (generalized): Secondary | ICD-10-CM | POA: Diagnosis not present

## 2016-08-21 DIAGNOSIS — R279 Unspecified lack of coordination: Secondary | ICD-10-CM | POA: Diagnosis not present

## 2016-08-21 DIAGNOSIS — Z9181 History of falling: Secondary | ICD-10-CM | POA: Diagnosis not present

## 2016-08-21 DIAGNOSIS — R54 Age-related physical debility: Secondary | ICD-10-CM | POA: Diagnosis not present

## 2016-08-21 DIAGNOSIS — I509 Heart failure, unspecified: Secondary | ICD-10-CM | POA: Diagnosis not present

## 2016-08-24 DIAGNOSIS — I509 Heart failure, unspecified: Secondary | ICD-10-CM | POA: Diagnosis not present

## 2016-08-24 DIAGNOSIS — R293 Abnormal posture: Secondary | ICD-10-CM | POA: Diagnosis not present

## 2016-08-24 DIAGNOSIS — R279 Unspecified lack of coordination: Secondary | ICD-10-CM | POA: Diagnosis not present

## 2016-08-24 DIAGNOSIS — R54 Age-related physical debility: Secondary | ICD-10-CM | POA: Diagnosis not present

## 2016-08-24 DIAGNOSIS — M6281 Muscle weakness (generalized): Secondary | ICD-10-CM | POA: Diagnosis not present

## 2016-08-24 DIAGNOSIS — R2681 Unsteadiness on feet: Secondary | ICD-10-CM | POA: Diagnosis not present

## 2016-08-24 DIAGNOSIS — Z9181 History of falling: Secondary | ICD-10-CM | POA: Diagnosis not present

## 2016-08-26 DIAGNOSIS — E119 Type 2 diabetes mellitus without complications: Secondary | ICD-10-CM | POA: Diagnosis not present

## 2016-08-26 DIAGNOSIS — E039 Hypothyroidism, unspecified: Secondary | ICD-10-CM | POA: Diagnosis not present

## 2016-08-26 DIAGNOSIS — I509 Heart failure, unspecified: Secondary | ICD-10-CM | POA: Diagnosis not present

## 2016-08-26 DIAGNOSIS — I4891 Unspecified atrial fibrillation: Secondary | ICD-10-CM | POA: Diagnosis not present

## 2016-08-26 DIAGNOSIS — F0390 Unspecified dementia without behavioral disturbance: Secondary | ICD-10-CM | POA: Diagnosis not present

## 2016-08-26 DIAGNOSIS — E559 Vitamin D deficiency, unspecified: Secondary | ICD-10-CM | POA: Diagnosis not present

## 2016-08-27 DIAGNOSIS — Z23 Encounter for immunization: Secondary | ICD-10-CM | POA: Diagnosis not present

## 2016-09-21 DIAGNOSIS — I10 Essential (primary) hypertension: Secondary | ICD-10-CM | POA: Diagnosis not present

## 2016-09-21 DIAGNOSIS — E119 Type 2 diabetes mellitus without complications: Secondary | ICD-10-CM | POA: Diagnosis not present

## 2016-09-21 DIAGNOSIS — E785 Hyperlipidemia, unspecified: Secondary | ICD-10-CM | POA: Diagnosis not present

## 2016-09-21 DIAGNOSIS — I2510 Atherosclerotic heart disease of native coronary artery without angina pectoris: Secondary | ICD-10-CM | POA: Diagnosis not present

## 2016-10-05 DIAGNOSIS — E785 Hyperlipidemia, unspecified: Secondary | ICD-10-CM | POA: Diagnosis not present

## 2016-10-30 DIAGNOSIS — M79671 Pain in right foot: Secondary | ICD-10-CM | POA: Diagnosis not present

## 2016-11-05 DIAGNOSIS — I2510 Atherosclerotic heart disease of native coronary artery without angina pectoris: Secondary | ICD-10-CM | POA: Diagnosis not present

## 2016-11-05 DIAGNOSIS — E118 Type 2 diabetes mellitus with unspecified complications: Secondary | ICD-10-CM | POA: Diagnosis not present

## 2016-11-05 DIAGNOSIS — F0391 Unspecified dementia with behavioral disturbance: Secondary | ICD-10-CM | POA: Diagnosis not present

## 2016-11-05 DIAGNOSIS — E039 Hypothyroidism, unspecified: Secondary | ICD-10-CM | POA: Diagnosis not present

## 2016-11-05 DIAGNOSIS — I10 Essential (primary) hypertension: Secondary | ICD-10-CM | POA: Diagnosis not present

## 2016-12-25 DIAGNOSIS — R600 Localized edema: Secondary | ICD-10-CM | POA: Diagnosis not present

## 2017-01-15 DIAGNOSIS — E785 Hyperlipidemia, unspecified: Secondary | ICD-10-CM | POA: Diagnosis not present

## 2017-01-15 DIAGNOSIS — I259 Chronic ischemic heart disease, unspecified: Secondary | ICD-10-CM | POA: Diagnosis not present

## 2017-01-15 DIAGNOSIS — E119 Type 2 diabetes mellitus without complications: Secondary | ICD-10-CM | POA: Diagnosis not present

## 2017-01-15 DIAGNOSIS — I4891 Unspecified atrial fibrillation: Secondary | ICD-10-CM | POA: Diagnosis not present

## 2017-01-15 DIAGNOSIS — I10 Essential (primary) hypertension: Secondary | ICD-10-CM | POA: Diagnosis not present

## 2017-01-21 DIAGNOSIS — Z961 Presence of intraocular lens: Secondary | ICD-10-CM | POA: Diagnosis not present

## 2017-01-21 DIAGNOSIS — H33023 Retinal detachment with multiple breaks, bilateral: Secondary | ICD-10-CM | POA: Diagnosis not present

## 2017-01-21 DIAGNOSIS — H33051 Total retinal detachment, right eye: Secondary | ICD-10-CM | POA: Diagnosis not present

## 2017-01-21 DIAGNOSIS — S0531XD Ocular laceration without prolapse or loss of intraocular tissue, right eye, subsequent encounter: Secondary | ICD-10-CM | POA: Diagnosis not present

## 2017-01-22 DIAGNOSIS — Z85828 Personal history of other malignant neoplasm of skin: Secondary | ICD-10-CM | POA: Diagnosis not present

## 2017-01-22 DIAGNOSIS — D485 Neoplasm of uncertain behavior of skin: Secondary | ICD-10-CM | POA: Diagnosis not present

## 2017-01-22 DIAGNOSIS — L570 Actinic keratosis: Secondary | ICD-10-CM | POA: Diagnosis not present

## 2017-01-22 DIAGNOSIS — L821 Other seborrheic keratosis: Secondary | ICD-10-CM | POA: Diagnosis not present

## 2017-02-15 ENCOUNTER — Ambulatory Visit (INDEPENDENT_AMBULATORY_CARE_PROVIDER_SITE_OTHER): Payer: Medicare Other | Admitting: Cardiovascular Disease

## 2017-02-15 ENCOUNTER — Encounter: Payer: Self-pay | Admitting: Cardiovascular Disease

## 2017-02-15 VITALS — BP 110/70 | HR 60 | Ht 74.0 in

## 2017-02-15 DIAGNOSIS — I2510 Atherosclerotic heart disease of native coronary artery without angina pectoris: Secondary | ICD-10-CM | POA: Diagnosis not present

## 2017-02-15 DIAGNOSIS — I5032 Chronic diastolic (congestive) heart failure: Secondary | ICD-10-CM

## 2017-02-15 NOTE — Patient Instructions (Signed)
Medication Instructions:  Your physician recommends that you continue on your current medications as directed. Please refer to the Current Medication list given to you today.   Labwork: None Ordered   Testing/Procedures: None Ordered   Follow-Up: Your physician wants you to follow-up in: 1 year with Dr. Nahser.  You will receive a reminder letter in the mail two months in advance. If you don't receive a letter, please call our office to schedule the follow-up appointment.   If you need a refill on your cardiac medications before your next appointment, please call your pharmacy.   Thank you for choosing [facility]! Anna-Marie Coller, RN [phone]    

## 2017-02-15 NOTE — Progress Notes (Signed)
[name] Date of Birth  09-10-1927 [facility] / [facility] [address] [place], [place]  [address] [phone]  Fax  [phone]  Problem list: Coronary artery disease-status post stenting of his LAD and diagonal artery 2. Small stenosis in his circumflex marginal artery that has been treated medically 3. Prostate cancer-status post radiation therapy - [name]. 4. Diabetes mellitus 5. Hyperlipidemia 6. Status post significant spiral fracture of his left arm. This was managed conservatively because of the severity of the fracture.  History of Present Illness:  83 yo gentleman with a history of coronary artery disease. Status post PTCA and stenting of his left ear descending artery and diagonal artery. He has a small stenosis in the left circumflex flexor event has been treated medically.  Has a history of prostate cancer and has been treated with radiation.  His last PSA was 0. He has a history of hyperlipidemia and diabetes mellitus.  He denies any chest pain or shortness of breath.  Feb. 9, 2017:  Doing well.   Was seen with daughter , [name] Not in any pain .   No CP, no dyspnea Has had leg edema.    Is wearing support hose.  Has seen [name] last year Has lots lots of strength -  Does not walk anymore,  Uses a wheelchair.   Can transfer with help   February 15, 2017:  Seen with daughter, [name].  Examined in wheelchair - is wheelchair bound. Needs lots of help to transfer to the bed.  Health is gradually / slowly  declining   Current Outpatient Prescriptions on File Prior to Visit  Medication Sig Dispense Refill  . acetaminophen (TYLENOL) 500 MG tablet Take 500 mg by mouth every 6 (six) hours as needed.    Marland Kitchen aspirin EC 81 MG tablet Take 81 mg by mouth daily.      Marland Kitchen bismuth subsalicylate (KAOPECTATE) 262 MG/15ML suspension Take 30 mLs by mouth every 6 (six) hours as needed.    . calcium citrate-vitamin D (CITRACAL+D) 315-200  MG-UNIT per tablet Take 1 tablet by mouth daily.      . cholecalciferol (VITAMIN D) 1000 UNITS tablet Take 1,000 Units by mouth daily.      Marland Kitchen CINNAMON PO Take 1,000 mg by mouth daily.      . clopidogrel (PLAVIX) 75 MG tablet Take 75 mg by mouth daily.    . clotrimazole (LOTRIMIN) 1 % cream Apply 1 application topically 2 (two) times daily.    . Cyanocobalamin (VITAMIN B 12) 100 MCG LOZG Take 1 tablet by mouth daily.    . divalproex (DEPAKOTE) 250 MG DR tablet Take 250 mg by mouth 2 (two) times daily.    Marland Kitchen escitalopram (LEXAPRO) 20 MG tablet Take 20 mg by mouth daily.    . fish oil-omega-3 fatty acids 1000 MG capsule Take 1 g by mouth daily.      . furosemide (LASIX) 20 MG tablet Take 20 mg by mouth daily.    Marland Kitchen galantamine (RAZADYNE ER) 16 MG 24 hr capsule Take 16 mg by mouth daily with breakfast.    . guaiFENesin (MUCINEX) 600 MG 12 hr tablet Take by mouth daily.    Marland Kitchen HUMALOG 100 UNIT/ML injection Inject 100 Units into the skin.   10  . hydrocortisone (ANUSOL-HC) 25 MG suppository Place 25 mg rectally as needed for hemorrhoids or itching.    . Insulin Glargine (LANTUS SOLOSTAR) 100 UNIT/ML Solostar Pen  Inject 60 Units into the skin 2 (two) times daily.    . Insulin Lispro, Human, (HUMALOG [place]) Inject 18 Units into the skin 3 (three) times daily.     . isosorbide mononitrate (IMDUR) 30 MG 24 hr tablet Take 30 mg by mouth daily.    Marland Kitchen levothyroxine (SYNTHROID, LEVOTHROID) 75 MCG tablet Take 75 mcg by mouth daily before breakfast.    . metFORMIN (GLUCOPHAGE) 1000 MG tablet Take 1,000 mg by mouth 2 (two) times daily with a meal.    . mirabegron ER (MYRBETRIQ) 25 MG TB24 tablet Take 25 mg by mouth daily.    . Multiple Vitamin (MULTIVITAMIN) tablet Take 1 tablet by mouth daily.      . potassium chloride (MICRO-K) 10 MEQ CR capsule Take 10 mEq by mouth daily.    . rosuvastatin (CRESTOR) 20 MG tablet Take 20 mg by mouth daily.      . Tamsulosin HCl (FLOMAX) 0.4 MG CAPS Take 0.4 mg by mouth daily.       . traZODone (DESYREL) 50 MG tablet Take 50 mg by mouth at bedtime.    . zoledronic acid (RECLAST) 5 MG/100ML SOLN injection Inject 5 mg into the vein once.     No current facility-administered medications on file prior to visit.     Allergies  Allergen Reactions  . Actos [Pioglitazone Hydrochloride]     swelling around lips  . Sulfonamide Derivatives     Red streaks on legs  . Sulfamethoxazole Itching and Rash    Past Medical History:  Diagnosis Date  . Coronary artery disease   . Diabetes mellitus   . Hyperlipidemia   . Osteoporosis    receiving reclast 5 mg IV yearly  . Prostate cancer ([place])   . Urinary frequency     Past Surgical History:  Procedure Laterality Date  . CARDIAC CATHETERIZATION  01/24/2007   EF 41%  . CARDIAC CATHETERIZATION  05/02/1999  . CARDIOVASCULAR STRESS TEST  01/14/2007   EF 41%  . CORONARY ANGIOPLASTY WITH STENT PLACEMENT     LEFT ANTERIOR DESCENDING ARTERY AND DIAGONAL ARTERY  . US ECHOCARDIOGRAPHY  07/07/2005   EF 55-60%    History  Smoking Status  . Never Smoker  Smokeless Tobacco  . Never Used    History  Alcohol Use  . Yes    Comment: occassionally    Family History  Problem Relation Age of Onset  . Coronary artery disease Father   . Heart failure Father     Reviw of Systems:  Reviewed in the HPI.  All other systems are negative.  Physical Exam: BP 110/70 (BP Location: Right Arm, Patient Position: Sitting, Cuff Size: Large)   Pulse 60   Ht 6\' 2"  (1.88 m)   SpO2 97%  The patient is alert and oriented x 3.  The mood and affect are normal.    Has slowed down  . Examined in the wheelchair  Skin: warm and dry.  Color is normal.    HEENT:   the sclera are nonicteric.  The mucous membranes are moist.  The carotids are 2+ without bruits.  There is no thyromegaly.  There is no JVD.    Lungs: clear.  The chest wall is non tender.    Heart: regular rate with a normal S1 and S2.  Soft systolic murmur , no  gallops, or rubs. The  PMI is not displaced.     Abdomen: good bowel sounds.  There is no guarding or rebound.  There is no hepatosplenomegaly or tenderness.  There are no masses.   Extremities:  Compression hose on .Marland Kitchen  Minimal - no edema   Neuro:  Cranial nerves II - XII are intact.  Motor and sensory functions are intact.    The gait is normal.  ECG: February 15, 2017: Reviewed by me:   NSR at 60.   Inc. RBBB  , LAHB,  NS ST abn.   Assessment / Plan:   1. Coronary artery disease: He is not having any episodes of angina. Continue current medications. He's currently on isosorbide. If his blood pressure drops any lower than I would discontinue the isosorbide.  We had discussed that at his last visit and we discussed it again .   2. Chronic diastolic congestive heart failure. He had an echo in 2016  that revealed normal left ventricle systolic function. He does have some diastolic dysfunction. At this point he is very limited and is basically  wheelchair-bound.     continue with very conservative therapy. Continue Lasix for leg edema.  Will see him in 1 year.    Mertie Moores, MD  02/15/2017 12:02 PM    [address],  [place], [place]  [address] Pager [phone] Phone: [phone]; Fax: [phone]

## 2017-02-24 DIAGNOSIS — E119 Type 2 diabetes mellitus without complications: Secondary | ICD-10-CM | POA: Diagnosis not present

## 2017-02-24 DIAGNOSIS — Z8546 Personal history of malignant neoplasm of prostate: Secondary | ICD-10-CM | POA: Diagnosis not present

## 2017-02-24 DIAGNOSIS — I369 Nonrheumatic tricuspid valve disorder, unspecified: Secondary | ICD-10-CM | POA: Diagnosis not present

## 2017-02-24 DIAGNOSIS — I10 Essential (primary) hypertension: Secondary | ICD-10-CM | POA: Diagnosis not present

## 2017-02-24 DIAGNOSIS — I4891 Unspecified atrial fibrillation: Secondary | ICD-10-CM | POA: Diagnosis not present

## 2017-02-24 DIAGNOSIS — D649 Anemia, unspecified: Secondary | ICD-10-CM | POA: Diagnosis not present

## 2017-02-24 DIAGNOSIS — I2510 Atherosclerotic heart disease of native coronary artery without angina pectoris: Secondary | ICD-10-CM | POA: Diagnosis not present

## 2017-02-24 DIAGNOSIS — E039 Hypothyroidism, unspecified: Secondary | ICD-10-CM | POA: Diagnosis not present

## 2017-02-24 DIAGNOSIS — I509 Heart failure, unspecified: Secondary | ICD-10-CM | POA: Diagnosis not present

## 2017-03-05 DIAGNOSIS — L570 Actinic keratosis: Secondary | ICD-10-CM | POA: Diagnosis not present

## 2017-03-05 DIAGNOSIS — L218 Other seborrheic dermatitis: Secondary | ICD-10-CM | POA: Diagnosis not present

## 2017-03-16 DIAGNOSIS — L84 Corns and callosities: Secondary | ICD-10-CM | POA: Diagnosis not present

## 2017-03-16 DIAGNOSIS — B351 Tinea unguium: Secondary | ICD-10-CM | POA: Diagnosis not present

## 2017-03-16 DIAGNOSIS — I739 Peripheral vascular disease, unspecified: Secondary | ICD-10-CM | POA: Diagnosis not present

## 2017-03-16 DIAGNOSIS — R600 Localized edema: Secondary | ICD-10-CM | POA: Diagnosis not present

## 2017-03-18 DIAGNOSIS — I2510 Atherosclerotic heart disease of native coronary artery without angina pectoris: Secondary | ICD-10-CM | POA: Diagnosis not present

## 2017-03-18 DIAGNOSIS — E118 Type 2 diabetes mellitus with unspecified complications: Secondary | ICD-10-CM | POA: Diagnosis not present

## 2017-03-18 DIAGNOSIS — R609 Edema, unspecified: Secondary | ICD-10-CM | POA: Diagnosis not present

## 2017-03-18 DIAGNOSIS — F0391 Unspecified dementia with behavioral disturbance: Secondary | ICD-10-CM | POA: Diagnosis not present

## 2017-03-18 DIAGNOSIS — F339 Major depressive disorder, recurrent, unspecified: Secondary | ICD-10-CM | POA: Diagnosis not present

## 2017-03-23 DIAGNOSIS — N400 Enlarged prostate without lower urinary tract symptoms: Secondary | ICD-10-CM | POA: Diagnosis not present

## 2017-03-24 DIAGNOSIS — H65197 Other acute nonsuppurative otitis media recurrent, unspecified ear: Secondary | ICD-10-CM | POA: Diagnosis not present

## 2017-03-29 ENCOUNTER — Other Ambulatory Visit (HOSPITAL_COMMUNITY): Payer: Self-pay | Admitting: Urology

## 2017-03-29 DIAGNOSIS — N3281 Overactive bladder: Secondary | ICD-10-CM | POA: Diagnosis not present

## 2017-03-29 DIAGNOSIS — N3941 Urge incontinence: Secondary | ICD-10-CM | POA: Diagnosis not present

## 2017-03-29 DIAGNOSIS — C61 Malignant neoplasm of prostate: Secondary | ICD-10-CM | POA: Diagnosis not present

## 2017-04-08 DIAGNOSIS — F0390 Unspecified dementia without behavioral disturbance: Secondary | ICD-10-CM | POA: Diagnosis not present

## 2017-04-08 DIAGNOSIS — R2681 Unsteadiness on feet: Secondary | ICD-10-CM | POA: Diagnosis not present

## 2017-04-08 DIAGNOSIS — R54 Age-related physical debility: Secondary | ICD-10-CM | POA: Diagnosis not present

## 2017-04-08 DIAGNOSIS — M6281 Muscle weakness (generalized): Secondary | ICD-10-CM | POA: Diagnosis not present

## 2017-04-09 DIAGNOSIS — M6281 Muscle weakness (generalized): Secondary | ICD-10-CM | POA: Diagnosis not present

## 2017-04-09 DIAGNOSIS — F0390 Unspecified dementia without behavioral disturbance: Secondary | ICD-10-CM | POA: Diagnosis not present

## 2017-04-09 DIAGNOSIS — R54 Age-related physical debility: Secondary | ICD-10-CM | POA: Diagnosis not present

## 2017-04-09 DIAGNOSIS — R2681 Unsteadiness on feet: Secondary | ICD-10-CM | POA: Diagnosis not present

## 2017-04-12 DIAGNOSIS — F0390 Unspecified dementia without behavioral disturbance: Secondary | ICD-10-CM | POA: Diagnosis not present

## 2017-04-12 DIAGNOSIS — M6281 Muscle weakness (generalized): Secondary | ICD-10-CM | POA: Diagnosis not present

## 2017-04-12 DIAGNOSIS — R2681 Unsteadiness on feet: Secondary | ICD-10-CM | POA: Diagnosis not present

## 2017-04-12 DIAGNOSIS — R54 Age-related physical debility: Secondary | ICD-10-CM | POA: Diagnosis not present

## 2017-04-13 DIAGNOSIS — F0390 Unspecified dementia without behavioral disturbance: Secondary | ICD-10-CM | POA: Diagnosis not present

## 2017-04-13 DIAGNOSIS — R54 Age-related physical debility: Secondary | ICD-10-CM | POA: Diagnosis not present

## 2017-04-13 DIAGNOSIS — M6281 Muscle weakness (generalized): Secondary | ICD-10-CM | POA: Diagnosis not present

## 2017-04-13 DIAGNOSIS — R2681 Unsteadiness on feet: Secondary | ICD-10-CM | POA: Diagnosis not present

## 2017-04-14 DIAGNOSIS — R2681 Unsteadiness on feet: Secondary | ICD-10-CM | POA: Diagnosis not present

## 2017-04-14 DIAGNOSIS — R54 Age-related physical debility: Secondary | ICD-10-CM | POA: Diagnosis not present

## 2017-04-14 DIAGNOSIS — F0390 Unspecified dementia without behavioral disturbance: Secondary | ICD-10-CM | POA: Diagnosis not present

## 2017-04-14 DIAGNOSIS — M6281 Muscle weakness (generalized): Secondary | ICD-10-CM | POA: Diagnosis not present

## 2017-04-15 DIAGNOSIS — R2681 Unsteadiness on feet: Secondary | ICD-10-CM | POA: Diagnosis not present

## 2017-04-15 DIAGNOSIS — F0390 Unspecified dementia without behavioral disturbance: Secondary | ICD-10-CM | POA: Diagnosis not present

## 2017-04-15 DIAGNOSIS — M6281 Muscle weakness (generalized): Secondary | ICD-10-CM | POA: Diagnosis not present

## 2017-04-15 DIAGNOSIS — R54 Age-related physical debility: Secondary | ICD-10-CM | POA: Diagnosis not present

## 2017-04-16 DIAGNOSIS — M6281 Muscle weakness (generalized): Secondary | ICD-10-CM | POA: Diagnosis not present

## 2017-04-16 DIAGNOSIS — R2681 Unsteadiness on feet: Secondary | ICD-10-CM | POA: Diagnosis not present

## 2017-04-16 DIAGNOSIS — F0390 Unspecified dementia without behavioral disturbance: Secondary | ICD-10-CM | POA: Diagnosis not present

## 2017-04-16 DIAGNOSIS — R54 Age-related physical debility: Secondary | ICD-10-CM | POA: Diagnosis not present

## 2017-04-19 DIAGNOSIS — R54 Age-related physical debility: Secondary | ICD-10-CM | POA: Diagnosis not present

## 2017-04-19 DIAGNOSIS — R2681 Unsteadiness on feet: Secondary | ICD-10-CM | POA: Diagnosis not present

## 2017-04-19 DIAGNOSIS — M6281 Muscle weakness (generalized): Secondary | ICD-10-CM | POA: Diagnosis not present

## 2017-04-19 DIAGNOSIS — F0390 Unspecified dementia without behavioral disturbance: Secondary | ICD-10-CM | POA: Diagnosis not present

## 2017-04-20 ENCOUNTER — Other Ambulatory Visit (HOSPITAL_COMMUNITY): Payer: Medicare Other

## 2017-04-20 ENCOUNTER — Encounter (HOSPITAL_COMMUNITY): Payer: Medicare Other

## 2017-04-20 DIAGNOSIS — F0390 Unspecified dementia without behavioral disturbance: Secondary | ICD-10-CM | POA: Diagnosis not present

## 2017-04-20 DIAGNOSIS — M6281 Muscle weakness (generalized): Secondary | ICD-10-CM | POA: Diagnosis not present

## 2017-04-20 DIAGNOSIS — R2681 Unsteadiness on feet: Secondary | ICD-10-CM | POA: Diagnosis not present

## 2017-04-20 DIAGNOSIS — R54 Age-related physical debility: Secondary | ICD-10-CM | POA: Diagnosis not present

## 2017-04-21 ENCOUNTER — Encounter (HOSPITAL_COMMUNITY)
Admission: RE | Admit: 2017-04-21 | Discharge: 2017-04-21 | Disposition: A | Discharge status: Home / Self Care | Payer: Medicare Other | Source: Ambulatory Visit | Attending: Urology | Admitting: Urology

## 2017-04-21 ENCOUNTER — Ambulatory Visit (HOSPITAL_COMMUNITY)
Admission: RE | Admit: 2017-04-21 | Discharge: 2017-04-21 | Disposition: A | Discharge status: Home / Self Care | Payer: Medicare Other | Source: Ambulatory Visit | Attending: Urology | Admitting: Urology

## 2017-04-21 DIAGNOSIS — M6281 Muscle weakness (generalized): Secondary | ICD-10-CM | POA: Diagnosis not present

## 2017-04-21 DIAGNOSIS — C61 Malignant neoplasm of prostate: Secondary | ICD-10-CM | POA: Diagnosis not present

## 2017-04-21 DIAGNOSIS — R2681 Unsteadiness on feet: Secondary | ICD-10-CM | POA: Diagnosis not present

## 2017-04-21 DIAGNOSIS — R54 Age-related physical debility: Secondary | ICD-10-CM | POA: Diagnosis not present

## 2017-04-21 DIAGNOSIS — F0390 Unspecified dementia without behavioral disturbance: Secondary | ICD-10-CM | POA: Diagnosis not present

## 2017-04-21 MED ORDER — TECHNETIUM TC 99M MEDRONATE IV KIT
21.2000 | PACK | Freq: Once | INTRAVENOUS | Status: AC
Start: 2017-04-21 — End: 2017-04-21
  Administered 2017-04-21: 21.2 via INTRAVENOUS

## 2017-04-23 DIAGNOSIS — R54 Age-related physical debility: Secondary | ICD-10-CM | POA: Diagnosis not present

## 2017-04-23 DIAGNOSIS — R2681 Unsteadiness on feet: Secondary | ICD-10-CM | POA: Diagnosis not present

## 2017-04-23 DIAGNOSIS — M6281 Muscle weakness (generalized): Secondary | ICD-10-CM | POA: Diagnosis not present

## 2017-04-23 DIAGNOSIS — F0390 Unspecified dementia without behavioral disturbance: Secondary | ICD-10-CM | POA: Diagnosis not present

## 2017-04-24 DIAGNOSIS — F0390 Unspecified dementia without behavioral disturbance: Secondary | ICD-10-CM | POA: Diagnosis not present

## 2017-04-24 DIAGNOSIS — R2681 Unsteadiness on feet: Secondary | ICD-10-CM | POA: Diagnosis not present

## 2017-04-24 DIAGNOSIS — R54 Age-related physical debility: Secondary | ICD-10-CM | POA: Diagnosis not present

## 2017-04-24 DIAGNOSIS — M6281 Muscle weakness (generalized): Secondary | ICD-10-CM | POA: Diagnosis not present

## 2017-04-26 DIAGNOSIS — M6281 Muscle weakness (generalized): Secondary | ICD-10-CM | POA: Diagnosis not present

## 2017-04-26 DIAGNOSIS — F0390 Unspecified dementia without behavioral disturbance: Secondary | ICD-10-CM | POA: Diagnosis not present

## 2017-04-26 DIAGNOSIS — R54 Age-related physical debility: Secondary | ICD-10-CM | POA: Diagnosis not present

## 2017-04-26 DIAGNOSIS — R2681 Unsteadiness on feet: Secondary | ICD-10-CM | POA: Diagnosis not present

## 2017-04-27 DIAGNOSIS — R54 Age-related physical debility: Secondary | ICD-10-CM | POA: Diagnosis not present

## 2017-04-27 DIAGNOSIS — F0390 Unspecified dementia without behavioral disturbance: Secondary | ICD-10-CM | POA: Diagnosis not present

## 2017-04-27 DIAGNOSIS — R2681 Unsteadiness on feet: Secondary | ICD-10-CM | POA: Diagnosis not present

## 2017-04-27 DIAGNOSIS — M6281 Muscle weakness (generalized): Secondary | ICD-10-CM | POA: Diagnosis not present

## 2017-04-28 DIAGNOSIS — F0390 Unspecified dementia without behavioral disturbance: Secondary | ICD-10-CM | POA: Diagnosis not present

## 2017-04-28 DIAGNOSIS — M6281 Muscle weakness (generalized): Secondary | ICD-10-CM | POA: Diagnosis not present

## 2017-04-28 DIAGNOSIS — R2681 Unsteadiness on feet: Secondary | ICD-10-CM | POA: Diagnosis not present

## 2017-04-28 DIAGNOSIS — R54 Age-related physical debility: Secondary | ICD-10-CM | POA: Diagnosis not present

## 2017-04-29 DIAGNOSIS — R54 Age-related physical debility: Secondary | ICD-10-CM | POA: Diagnosis not present

## 2017-04-29 DIAGNOSIS — M6281 Muscle weakness (generalized): Secondary | ICD-10-CM | POA: Diagnosis not present

## 2017-04-29 DIAGNOSIS — R2681 Unsteadiness on feet: Secondary | ICD-10-CM | POA: Diagnosis not present

## 2017-04-29 DIAGNOSIS — F0390 Unspecified dementia without behavioral disturbance: Secondary | ICD-10-CM | POA: Diagnosis not present

## 2017-04-30 DIAGNOSIS — R2681 Unsteadiness on feet: Secondary | ICD-10-CM | POA: Diagnosis not present

## 2017-04-30 DIAGNOSIS — M6281 Muscle weakness (generalized): Secondary | ICD-10-CM | POA: Diagnosis not present

## 2017-04-30 DIAGNOSIS — R54 Age-related physical debility: Secondary | ICD-10-CM | POA: Diagnosis not present

## 2017-04-30 DIAGNOSIS — F0390 Unspecified dementia without behavioral disturbance: Secondary | ICD-10-CM | POA: Diagnosis not present

## 2017-05-01 ENCOUNTER — Emergency Department (HOSPITAL_COMMUNITY): Payer: Medicare Other

## 2017-05-01 ENCOUNTER — Encounter (HOSPITAL_COMMUNITY): Payer: Self-pay | Admitting: Emergency Medicine

## 2017-05-01 ENCOUNTER — Emergency Department (HOSPITAL_COMMUNITY)
Admission: EM | Admit: 2017-05-01 | Discharge: 2017-05-01 | Disposition: A | Discharge status: Home / Self Care | Payer: Medicare Other | Attending: Emergency Medicine | Admitting: Emergency Medicine

## 2017-05-01 DIAGNOSIS — T148XXA Other injury of unspecified body region, initial encounter: Secondary | ICD-10-CM | POA: Diagnosis not present

## 2017-05-01 DIAGNOSIS — Z7984 Long term (current) use of oral hypoglycemic drugs: Secondary | ICD-10-CM | POA: Diagnosis not present

## 2017-05-01 DIAGNOSIS — Z794 Long term (current) use of insulin: Secondary | ICD-10-CM | POA: Diagnosis not present

## 2017-05-01 DIAGNOSIS — Z8546 Personal history of malignant neoplasm of prostate: Secondary | ICD-10-CM | POA: Insufficient documentation

## 2017-05-01 DIAGNOSIS — S42295A Other nondisplaced fracture of upper end of left humerus, initial encounter for closed fracture: Secondary | ICD-10-CM | POA: Insufficient documentation

## 2017-05-01 DIAGNOSIS — Z7902 Long term (current) use of antithrombotics/antiplatelets: Secondary | ICD-10-CM | POA: Diagnosis not present

## 2017-05-01 DIAGNOSIS — Z79899 Other long term (current) drug therapy: Secondary | ICD-10-CM | POA: Diagnosis not present

## 2017-05-01 DIAGNOSIS — Z7982 Long term (current) use of aspirin: Secondary | ICD-10-CM | POA: Insufficient documentation

## 2017-05-01 DIAGNOSIS — Y9210 Unspecified residential institution as the place of occurrence of the external cause: Secondary | ICD-10-CM | POA: Diagnosis not present

## 2017-05-01 DIAGNOSIS — I10 Essential (primary) hypertension: Secondary | ICD-10-CM | POA: Diagnosis not present

## 2017-05-01 DIAGNOSIS — Z955 Presence of coronary angioplasty implant and graft: Secondary | ICD-10-CM | POA: Diagnosis not present

## 2017-05-01 DIAGNOSIS — Y939 Activity, unspecified: Secondary | ICD-10-CM | POA: Insufficient documentation

## 2017-05-01 DIAGNOSIS — W1811XA Fall from or off toilet without subsequent striking against object, initial encounter: Secondary | ICD-10-CM | POA: Insufficient documentation

## 2017-05-01 DIAGNOSIS — S42215A Unspecified nondisplaced fracture of surgical neck of left humerus, initial encounter for closed fracture: Secondary | ICD-10-CM | POA: Diagnosis not present

## 2017-05-01 DIAGNOSIS — E119 Type 2 diabetes mellitus without complications: Secondary | ICD-10-CM | POA: Insufficient documentation

## 2017-05-01 DIAGNOSIS — Y999 Unspecified external cause status: Secondary | ICD-10-CM | POA: Diagnosis not present

## 2017-05-01 DIAGNOSIS — M25512 Pain in left shoulder: Secondary | ICD-10-CM | POA: Diagnosis not present

## 2017-05-01 DIAGNOSIS — S4992XA Unspecified injury of left shoulder and upper arm, initial encounter: Secondary | ICD-10-CM | POA: Diagnosis not present

## 2017-05-01 DIAGNOSIS — I2510 Atherosclerotic heart disease of native coronary artery without angina pectoris: Secondary | ICD-10-CM | POA: Diagnosis not present

## 2017-05-01 MED ORDER — OXYCODONE-ACETAMINOPHEN 5-325 MG PO TABS
1.0000 | ORAL_TABLET | Freq: Once | ORAL | Status: AC
  Administered 2017-05-01: 1 via ORAL
  Filled 2017-05-01 (×2): qty 1

## 2017-05-01 MED ORDER — OXYCODONE-ACETAMINOPHEN 5-325 MG PO TABS: 1.0000 | ORAL_TABLET | ORAL | 0 refills | Status: AC | PRN

## 2017-05-01 NOTE — ED Notes (Signed)
Patient transported to X-ray 

## 2017-05-01 NOTE — ED Notes (Signed)
Patient given sandwich, cheese, coffee, and milk.

## 2017-05-01 NOTE — ED Notes (Signed)
Per request, patient's daughter made aware [facility] present and transporting patient to facility.

## 2017-05-01 NOTE — ED Notes (Signed)
[facility] called for transport.  

## 2017-05-01 NOTE — ED Triage Notes (Signed)
Per EMS, patient from [facility] assisted [facility]. Patient had unwitnessed fall while trying to go to the bathroom. Patient c/o left shoulder pain. Denies head, neck, and back pain. A&O to baseline. Hx dementia.  22 R Hand  BP 120/70 HR 80

## 2017-05-01 NOTE — ED Notes (Signed)
ED Provider at bedside. 

## 2017-05-01 NOTE — ED Notes (Signed)
Bed: AR01 Expected date:  Expected time:  Means of arrival:  Comments: 79 M bilateral shoulder pain post fall

## 2017-05-01 NOTE — ED Provider Notes (Signed)
[facility] DEPT Provider Note   CSN: [id number] Arrival date & time: 05/01/17  1350     History   Chief Complaint Chief Complaint  Patient presents with  . Fall    HPI [name] is a 83 y.o. male.  HPI  Patient presents emergency department complaining of left shoulder pain after fall while using the commode today.  He denies head injury.  No neck pain.  Denies chest pain.  No shortness of breath.  Denies back pain.  Denies abdominal pain.  No pain in his hips, knees, ankles.  Pain is mild to moderate and worse with range of motion of left shoulder and palpation of the left proximal humerus.   Past Medical History:  Diagnosis Date  . Coronary artery disease   . Diabetes mellitus   . Hyperlipidemia   . Osteoporosis    receiving reclast 5 mg IV yearly  . Prostate cancer ([place])   . Urinary frequency     Patient Active Problem List   Diagnosis Date Noted  . Chronic diastolic CHF (congestive heart failure) ([place]) 01/02/2016  . Pneumonia 10/05/2011  . Gait instability 10/05/2011  . Hypoxemia requiring supplemental oxygen 10/04/2011  . Type II or unspecified type diabetes mellitus with peripheral circulatory disorders, uncontrolled(250.72) 10/04/2011  . CAD (coronary artery disease) 10/04/2011  . Essential hypertension, benign 10/04/2011  . Other and unspecified hyperlipidemia 10/04/2011  . Osteoporosis 10/04/2011  . RADIATION PROCTITIS 08/06/2010    Past Surgical History:  Procedure Laterality Date  . CARDIAC CATHETERIZATION  01/24/2007   EF 41%  . CARDIAC CATHETERIZATION  05/02/1999  . CARDIOVASCULAR STRESS TEST  01/14/2007   EF 41%  . CORONARY ANGIOPLASTY WITH STENT PLACEMENT     LEFT ANTERIOR DESCENDING ARTERY AND DIAGONAL ARTERY  . US ECHOCARDIOGRAPHY  07/07/2005   EF 55-60%       Home Medications    Prior to Admission medications   Medication Sig Start Date End Date Taking? Authorizing Provider  acetaminophen (TYLENOL) 500 MG tablet Take 500 mg by  mouth every 6 (six) hours as needed.    [provider]  aspirin EC 81 MG tablet Take 81 mg by mouth daily.      [provider]  bismuth subsalicylate (KAOPECTATE) 262 MG/15ML suspension Take 30 mLs by mouth every 6 (six) hours as needed.    [provider]  calcium citrate-vitamin D (CITRACAL+D) 315-200 MG-UNIT per tablet Take 1 tablet by mouth daily.      [provider]  cholecalciferol (VITAMIN D) 1000 UNITS tablet Take 1,000 Units by mouth daily.      [provider]  CINNAMON PO Take 1,000 mg by mouth daily.      [provider]  clopidogrel (PLAVIX) 75 MG tablet Take 75 mg by mouth daily.    [provider]  clotrimazole (LOTRIMIN) 1 % cream Apply 1 application topically 2 (two) times daily.    [provider]  Cyanocobalamin (VITAMIN B 12) 100 MCG LOZG Take 1 tablet by mouth daily.    [provider]  divalproex (DEPAKOTE) 250 MG DR tablet Take 250 mg by mouth 2 (two) times daily.    [provider]  escitalopram (LEXAPRO) 20 MG tablet Take 20 mg by mouth daily.    [provider]  fish oil-omega-3 fatty acids 1000 MG capsule Take 1 g by mouth daily.      [provider]  furosemide (LASIX) 20 MG tablet Take 20 mg by mouth daily.  [provider]  galantamine (RAZADYNE ER) 16 MG 24 hr capsule Take 16 mg by mouth daily with breakfast.    [provider]  guaiFENesin (MUCINEX) 600 MG 12 hr tablet Take by mouth daily.    [provider]  HUMALOG 100 UNIT/ML injection Inject 100 Units into the skin.  07/01/15   [provider]  hydrocortisone (ANUSOL-HC) 25 MG suppository Place 25 mg rectally as needed for hemorrhoids or itching.    [provider]  Insulin Glargine (LANTUS SOLOSTAR) 100 UNIT/ML Solostar Pen Inject 60 Units into the skin 2 (two) times daily.    [provider]  Insulin Lispro, Human, (HUMALOG [place]) Inject 18 Units into  the skin 3 (three) times daily.     [provider]  isosorbide mononitrate (IMDUR) 30 MG 24 hr tablet Take 30 mg by mouth daily.    [provider]  levothyroxine (SYNTHROID, LEVOTHROID) 75 MCG tablet Take 75 mcg by mouth daily before breakfast.    [provider]  loratadine (CLARITIN) 10 MG tablet Take 10 mg by mouth daily.    [provider]  metFORMIN (GLUCOPHAGE) 1000 MG tablet Take 1,000 mg by mouth 2 (two) times daily with a meal.    [provider]  mirabegron ER (MYRBETRIQ) 25 MG TB24 tablet Take 25 mg by mouth daily.    [provider]  Multiple Vitamin (MULTIVITAMIN) tablet Take 1 tablet by mouth daily.      [provider]  potassium chloride (MICRO-K) 10 MEQ CR capsule Take 10 mEq by mouth daily.    [provider]  rosuvastatin (CRESTOR) 20 MG tablet Take 20 mg by mouth daily.      [provider]  Tamsulosin HCl (FLOMAX) 0.4 MG CAPS Take 0.4 mg by mouth daily.      [provider]  TOVIAZ 4 MG TB24 tablet Take 4 mg by mouth daily. 01/22/17   [provider]  traZODone (DESYREL) 50 MG tablet Take 50 mg by mouth at bedtime.    [provider]  zoledronic acid (RECLAST) 5 MG/100ML SOLN injection Inject 5 mg into the vein once.    [provider]    Family History Family History  Problem Relation Age of Onset  . Coronary artery disease Father   . Heart failure Father     Social History Social History  Substance Use Topics  . Smoking status: Never Smoker  . Smokeless tobacco: Never Used  . Alcohol use Yes     Comment: occassionally     Allergies   Actos [pioglitazone hydrochloride]; Sulfonamide derivatives; and Sulfamethoxazole   Review of Systems Review of Systems  All other systems reviewed and are negative.    Physical Exam Updated Vital Signs BP 113/69 (BP Location: Left Arm)   Pulse (!) 58   Temp 98.4 F (36.9 C) (Oral)   Resp 16   SpO2  95%   Physical Exam  Constitutional: He is oriented to person, place, and time. He appears well-developed and well-nourished.  HENT:  Head: Normocephalic and atraumatic.  Eyes: EOM are normal.  Neck: Normal range of motion.  C-spine nontender  Cardiovascular: Normal rate, regular rhythm, normal heart sounds and intact distal pulses.   Pulmonary/Chest: Effort normal and breath sounds normal. No respiratory distress.  Abdominal: Soft. He exhibits no distension. There is no tenderness.  Musculoskeletal:  Painful range of motion of left shoulder without obvious deformity.  Normal left radial pulse.  Neurological: He is alert  and oriented to person, place, and time.  Skin: Skin is warm and dry.  Psychiatric: He has a normal mood and affect. Judgment normal.  Nursing note and vitals reviewed.    ED Treatments / Results  Labs (all labs ordered are listed, but only abnormal results are displayed) Labs Reviewed - No data to display  EKG  EKG Interpretation None       Radiology Dg Shoulder Left  Result Date: 05/01/2017 CLINICAL DATA:  Unwitnessed fall.  Left shoulder pain. EXAM: LEFT SHOULDER - 2+ VIEW COMPARISON:  Left shoulder radiographs 07/05/2012. FINDINGS: The left shoulder is located. Advanced degenerative changes are present. Remote healed fractures of the humeral shaft ir is again seen. A lucency through the surgical neck raises concern for an acute fracture. The clavicle is intact. The lung volumes are low. Atherosclerotic changes are present at the aortic arch. IMPRESSION: 1. Possible left surgical neck fracture of the proximal humerus. CT could be used for further evaluation. 2. The shoulder is located. 3. Advanced degenerative changes of the left shoulder. 4. Healed previous left humeral shaft fractures. Electronically Signed   By: San Morelle M.D.   On: 05/01/2017 15:15   Dg Humerus Left  Result Date: 05/01/2017 CLINICAL DATA:  Fall in bathroom. Left shoulder pain.  Previous left humerus fracture. EXAM: LEFT HUMERUS - 2+ VIEW COMPARISON:  Left shoulder radiographs 07/05/2012. Left humerus radiographs 08/08/2008. FINDINGS: Healed complex midshaft humerus fractures are again seen. A lucency surgical neck of the proximal left humerus is concerning for acute fracture. The elbow and shoulder are located. IMPRESSION: 1. Lucency concerning for acute nondisplaced fracture of the surgical neck in the left humerus. 2. Healed complex midshaft fractures. 3. Advanced degenerative changes at the left shoulder. Electronically Signed   By: San Morelle M.D.   On: 05/01/2017 15:24         Procedures Procedures (including critical care time)  Medications Ordered in ED Medications  oxyCODONE-acetaminophen (PERCOCET/ROXICET) 5-325 MG per tablet 1 tablet (1 tablet Oral Refused 05/01/17 1442)     Initial Impression / Assessment and Plan / ED Course  I have reviewed the triage vital signs and the nursing notes.  Pertinent labs & imaging results that were available during my care of the patient were reviewed by me and considered in my medical decision making (see chart for details).     Patient is overall well-appearing.  C-spine nontender.  Left proximal humerus fracture.  Orthopedic follow-up.  Final Clinical Impressions(s) / ED Diagnoses   Final diagnoses:  None    New Prescriptions New Prescriptions   No medications on file     Jola Schmidt, MD 05/01/17 1540

## 2017-05-03 DIAGNOSIS — R2681 Unsteadiness on feet: Secondary | ICD-10-CM | POA: Diagnosis not present

## 2017-05-03 DIAGNOSIS — R54 Age-related physical debility: Secondary | ICD-10-CM | POA: Diagnosis not present

## 2017-05-03 DIAGNOSIS — F0390 Unspecified dementia without behavioral disturbance: Secondary | ICD-10-CM | POA: Diagnosis not present

## 2017-05-03 DIAGNOSIS — M6281 Muscle weakness (generalized): Secondary | ICD-10-CM | POA: Diagnosis not present

## 2017-05-03 DIAGNOSIS — E039 Hypothyroidism, unspecified: Secondary | ICD-10-CM | POA: Diagnosis not present

## 2017-05-04 DIAGNOSIS — R2681 Unsteadiness on feet: Secondary | ICD-10-CM | POA: Diagnosis not present

## 2017-05-04 DIAGNOSIS — M6281 Muscle weakness (generalized): Secondary | ICD-10-CM | POA: Diagnosis not present

## 2017-05-04 DIAGNOSIS — R54 Age-related physical debility: Secondary | ICD-10-CM | POA: Diagnosis not present

## 2017-05-04 DIAGNOSIS — F0390 Unspecified dementia without behavioral disturbance: Secondary | ICD-10-CM | POA: Diagnosis not present

## 2017-05-05 DIAGNOSIS — M6281 Muscle weakness (generalized): Secondary | ICD-10-CM | POA: Diagnosis not present

## 2017-05-05 DIAGNOSIS — R2681 Unsteadiness on feet: Secondary | ICD-10-CM | POA: Diagnosis not present

## 2017-05-05 DIAGNOSIS — F0390 Unspecified dementia without behavioral disturbance: Secondary | ICD-10-CM | POA: Diagnosis not present

## 2017-05-05 DIAGNOSIS — R54 Age-related physical debility: Secondary | ICD-10-CM | POA: Diagnosis not present

## 2017-05-06 ENCOUNTER — Ambulatory Visit (INDEPENDENT_AMBULATORY_CARE_PROVIDER_SITE_OTHER): Payer: Medicare Other | Admitting: Physician Assistant

## 2017-05-06 DIAGNOSIS — S42201A Unspecified fracture of upper end of right humerus, initial encounter for closed fracture: Secondary | ICD-10-CM

## 2017-05-06 DIAGNOSIS — I2510 Atherosclerotic heart disease of native coronary artery without angina pectoris: Secondary | ICD-10-CM

## 2017-05-06 NOTE — Progress Notes (Signed)
Office Visit Note   Patient: [name]           Date of Birth: 1927/02/04           MRN: [id number] Visit Date: 05/06/2017              Requested by: Crist Infante, MD [address] [place], [place] [address] PCP: Crist Infante, MD   Assessment & Plan: Visit Diagnoses:  1. Closed fracture of proximal end of right humerus, unspecified fracture morphology, initial encounter     Plan: Continue left arm sling. No abduction overhead motion, external rotation of the left humerus. Physical therapy/occupational therapy for gentle range of motion left elbow forearm wrist and hand. Squeeze ball left hand to help with circulation. Sling at all times except for PT and bathing. We'll see him back in 2 weeks and obtain AP lateral views of the left left proximal humerus.  Follow-Up Instructions: Return in about 2 weeks (around 05/20/2017).   Orders:  No orders of the defined types were placed in this encounter.  No orders of the defined types were placed in this encounter.     Procedures: No procedures performed   Clinical Data: No additional findings.   Subjective: Left humerus fracture  HPI Mr. Belisle is a is a 83 year old male who comes in today first time for a left proximal humerus fracture. He reports that he was getting off the commode at the skilled facility which he resides and fell injuring his left arm. He went to the ER on 05/01/2017 and radiographs showed a nondisplaced proximal humerus surgical neck fracture. He has history of the left humerus shaft fracture in the past that was treated conservatively. He denies any other injuries. Denies any shortness breath chest pain dizziness or lightheadedness at the time of the incident. Personally reviewed the x-rays of the left humerus dated 05/01/2017 knee shows a nondisplaced surgical neck of the proximal humerus fracture. The humeral head well located. He has a well-healed old humeral shaft fracture.  Review of Systems Denies  chest pain shortness breath fevers chills. Denies any other injury outside of the left arm. Please see history of present illness otherwise.  Objective: Vital Signs: There were no vitals taken for this visit.  Physical Exam  Constitutional: He is oriented to person, place, and time. He appears well-developed and well-nourished. No distress.  Pulmonary/Chest: Effort normal.  Neurological: He is alert and oriented to person, place, and time.  Skin: He is not diaphoretic.  Psychiatric: He has a normal mood and affect. His behavior is normal.    Ortho Exam Left humerus he has no rashes skin lesions ulcerations significant ecchymosis over the mid shaft  of the humerusto the elbow. Good range of motion left elbow. Full supination pronation forearm. Wrist good range of motion without pain. Radial pulses 2+. He has full sensation of the hand to light touch and good range of motion of fingers without pain. Specialty Comments:  No specialty comments available.  Imaging: No results found.   PMFS History: Patient Active Problem List   Diagnosis Date Noted  . Chronic diastolic CHF (congestive heart failure) ([place]) 01/02/2016  . Pneumonia 10/05/2011  . Gait instability 10/05/2011  . Hypoxemia requiring supplemental oxygen 10/04/2011  . Type II or unspecified type diabetes mellitus with peripheral circulatory disorders, uncontrolled(250.72) 10/04/2011  . CAD (coronary artery disease) 10/04/2011  . Essential hypertension, benign 10/04/2011  . Other and unspecified hyperlipidemia 10/04/2011  . Osteoporosis 10/04/2011  . RADIATION PROCTITIS  08/06/2010   Past Medical History:  Diagnosis Date  . Coronary artery disease   . Diabetes mellitus   . Hyperlipidemia   . Osteoporosis    receiving reclast 5 mg IV yearly  . Prostate cancer ([place])   . Urinary frequency     Family History  Problem Relation Age of Onset  . Coronary artery disease Father   . Heart failure Father     Past Surgical  History:  Procedure Laterality Date  . CARDIAC CATHETERIZATION  01/24/2007   EF 41%  . CARDIAC CATHETERIZATION  05/02/1999  . CARDIOVASCULAR STRESS TEST  01/14/2007   EF 41%  . CORONARY ANGIOPLASTY WITH STENT PLACEMENT     LEFT ANTERIOR DESCENDING ARTERY AND DIAGONAL ARTERY  . US ECHOCARDIOGRAPHY  07/07/2005   EF 55-60%   Social History   Occupational History  . Not on file.   Social History Main Topics  . Smoking status: Never Smoker  . Smokeless tobacco: Never Used  . Alcohol use Yes     Comment: occassionally  . Drug use: No  . Sexual activity: Not on file

## 2017-05-07 DIAGNOSIS — Z9181 History of falling: Secondary | ICD-10-CM | POA: Diagnosis not present

## 2017-05-07 DIAGNOSIS — S42215S Unspecified nondisplaced fracture of surgical neck of left humerus, sequela: Secondary | ICD-10-CM | POA: Diagnosis not present

## 2017-05-07 DIAGNOSIS — R278 Other lack of coordination: Secondary | ICD-10-CM | POA: Diagnosis not present

## 2017-05-10 DIAGNOSIS — Z9181 History of falling: Secondary | ICD-10-CM | POA: Diagnosis not present

## 2017-05-10 DIAGNOSIS — S42215S Unspecified nondisplaced fracture of surgical neck of left humerus, sequela: Secondary | ICD-10-CM | POA: Diagnosis not present

## 2017-05-10 DIAGNOSIS — R278 Other lack of coordination: Secondary | ICD-10-CM | POA: Diagnosis not present

## 2017-05-12 DIAGNOSIS — R278 Other lack of coordination: Secondary | ICD-10-CM | POA: Diagnosis not present

## 2017-05-12 DIAGNOSIS — Z9181 History of falling: Secondary | ICD-10-CM | POA: Diagnosis not present

## 2017-05-12 DIAGNOSIS — S42215S Unspecified nondisplaced fracture of surgical neck of left humerus, sequela: Secondary | ICD-10-CM | POA: Diagnosis not present

## 2017-05-14 DIAGNOSIS — R278 Other lack of coordination: Secondary | ICD-10-CM | POA: Diagnosis not present

## 2017-05-14 DIAGNOSIS — Z9181 History of falling: Secondary | ICD-10-CM | POA: Diagnosis not present

## 2017-05-14 DIAGNOSIS — S42215S Unspecified nondisplaced fracture of surgical neck of left humerus, sequela: Secondary | ICD-10-CM | POA: Diagnosis not present

## 2017-05-17 DIAGNOSIS — S42215S Unspecified nondisplaced fracture of surgical neck of left humerus, sequela: Secondary | ICD-10-CM | POA: Diagnosis not present

## 2017-05-17 DIAGNOSIS — R278 Other lack of coordination: Secondary | ICD-10-CM | POA: Diagnosis not present

## 2017-05-17 DIAGNOSIS — Z9181 History of falling: Secondary | ICD-10-CM | POA: Diagnosis not present

## 2017-05-18 DIAGNOSIS — S42215S Unspecified nondisplaced fracture of surgical neck of left humerus, sequela: Secondary | ICD-10-CM | POA: Diagnosis not present

## 2017-05-18 DIAGNOSIS — R278 Other lack of coordination: Secondary | ICD-10-CM | POA: Diagnosis not present

## 2017-05-18 DIAGNOSIS — Z9181 History of falling: Secondary | ICD-10-CM | POA: Diagnosis not present

## 2017-05-19 ENCOUNTER — Ambulatory Visit (INDEPENDENT_AMBULATORY_CARE_PROVIDER_SITE_OTHER): Payer: Medicare Other | Admitting: Physician Assistant

## 2017-05-21 DIAGNOSIS — E119 Type 2 diabetes mellitus without complications: Secondary | ICD-10-CM | POA: Diagnosis not present

## 2017-05-21 DIAGNOSIS — S42215S Unspecified nondisplaced fracture of surgical neck of left humerus, sequela: Secondary | ICD-10-CM | POA: Diagnosis not present

## 2017-05-21 DIAGNOSIS — I4891 Unspecified atrial fibrillation: Secondary | ICD-10-CM | POA: Diagnosis not present

## 2017-05-21 DIAGNOSIS — Z9181 History of falling: Secondary | ICD-10-CM | POA: Diagnosis not present

## 2017-05-21 DIAGNOSIS — I10 Essential (primary) hypertension: Secondary | ICD-10-CM | POA: Diagnosis not present

## 2017-05-21 DIAGNOSIS — E785 Hyperlipidemia, unspecified: Secondary | ICD-10-CM | POA: Diagnosis not present

## 2017-05-21 DIAGNOSIS — R278 Other lack of coordination: Secondary | ICD-10-CM | POA: Diagnosis not present

## 2017-05-24 ENCOUNTER — Ambulatory Visit (INDEPENDENT_AMBULATORY_CARE_PROVIDER_SITE_OTHER): Payer: Medicare Other

## 2017-05-24 ENCOUNTER — Ambulatory Visit (INDEPENDENT_AMBULATORY_CARE_PROVIDER_SITE_OTHER): Payer: Medicare Other | Admitting: Physician Assistant

## 2017-05-24 DIAGNOSIS — S42302D Unspecified fracture of shaft of humerus, left arm, subsequent encounter for fracture with routine healing: Secondary | ICD-10-CM | POA: Diagnosis not present

## 2017-05-24 NOTE — Progress Notes (Signed)
Office Visit Note   Patient: [name]           Date of Birth: Sep 18, 1927           MRN: [id number] Visit Date: 05/24/2017              Requested by: Crist Infante, MD [address] [place], [place] [address] PCP: Crist Infante, MD   Assessment & Plan: Visit Diagnoses:  1. Closed fracture of shaft of left humerus with routine healing, unspecified fracture morphology, subsequent encounter     Plan: He'll begin working on his own and with physical therapy for range of motion elbow supination pronation forearm. He can wear the sling for comfort. He will remain on nonweightbearing left arm.  Follow-Up Instructions: Return in about 4 weeks (around 06/21/2017).   Orders:  Orders Placed This Encounter  Procedures  . XR Humerus Left   No orders of the defined types were placed in this encounter.     Procedures: No procedures performed   Clinical Data: No additional findings.   Subjective: Chief Complaint  Patient presents with  . Left Shoulder - Follow-up, Fracture    HPI Mr. Cothern returns now 3 Weeks status post a left proximal humerus fracture. Beginning at a previous humerus fracture that is well-healed. He has no complaints in regards to the arm today. States the pain overall is decreasing. He has been wearing a sling. Review of Systems   Objective: Vital Signs: There were no vitals taken for this visit.  Physical Exam  Constitutional: He appears well-developed and well-nourished. No distress.  Neurological: He is alert.  Skin: He is not diaphoretic.  Psychiatric: He has a normal mood and affect. His behavior is normal.    Ortho Exam Left upper extremity radial pulses intact. He has somewhat limited supination pronation forearm. Tenderness over the proximal humerus. Specialty Comments:  No specialty comments available.  Imaging: Xr Humerus Left  Result Date: 05/24/2017 Left proximal humerus fracture remains nondisplaced. Early signs of consolidation are  seen. No other acute fractures. Old well-healed humeral shaft fractures are evident.    PMFS History: Patient Active Problem List   Diagnosis Date Noted  . Chronic diastolic CHF (congestive heart failure) ([facility]) 01/02/2016  . Pneumonia 10/05/2011  . Gait instability 10/05/2011  . Hypoxemia requiring supplemental oxygen 10/04/2011  . Type II or unspecified type diabetes mellitus with peripheral circulatory disorders, uncontrolled(250.72) 10/04/2011  . CAD (coronary artery disease) 10/04/2011  . Essential hypertension, benign 10/04/2011  . Other and unspecified hyperlipidemia 10/04/2011  . Osteoporosis 10/04/2011  . RADIATION PROCTITIS 08/06/2010   Past Medical History:  Diagnosis Date  . Coronary artery disease   . Diabetes mellitus   . Hyperlipidemia   . Osteoporosis    receiving reclast 5 mg IV yearly  . Prostate cancer ([facility])   . Urinary frequency     Family History  Problem Relation Age of Onset  . Coronary artery disease Father   . Heart failure Father     Past Surgical History:  Procedure Laterality Date  . CARDIAC CATHETERIZATION  01/24/2007   EF 41%  . CARDIAC CATHETERIZATION  05/02/1999  . CARDIOVASCULAR STRESS TEST  01/14/2007   EF 41%  . CORONARY ANGIOPLASTY WITH STENT PLACEMENT     LEFT ANTERIOR DESCENDING ARTERY AND DIAGONAL ARTERY  . US ECHOCARDIOGRAPHY  07/07/2005   EF 55-60%   Social History   Occupational History  . Not on file.   Social History Main Topics  .  Smoking status: Never Smoker  . Smokeless tobacco: Never Used  . Alcohol use Yes     Comment: occassionally  . Drug use: No  . Sexual activity: Not on file

## 2017-05-25 DIAGNOSIS — R278 Other lack of coordination: Secondary | ICD-10-CM | POA: Diagnosis not present

## 2017-05-25 DIAGNOSIS — Z9181 History of falling: Secondary | ICD-10-CM | POA: Diagnosis not present

## 2017-05-25 DIAGNOSIS — M6281 Muscle weakness (generalized): Secondary | ICD-10-CM | POA: Diagnosis not present

## 2017-05-25 DIAGNOSIS — S42215S Unspecified nondisplaced fracture of surgical neck of left humerus, sequela: Secondary | ICD-10-CM | POA: Diagnosis not present

## 2017-05-27 DIAGNOSIS — M6281 Muscle weakness (generalized): Secondary | ICD-10-CM | POA: Diagnosis not present

## 2017-05-27 DIAGNOSIS — Z9181 History of falling: Secondary | ICD-10-CM | POA: Diagnosis not present

## 2017-05-27 DIAGNOSIS — S42215S Unspecified nondisplaced fracture of surgical neck of left humerus, sequela: Secondary | ICD-10-CM | POA: Diagnosis not present

## 2017-05-27 DIAGNOSIS — R278 Other lack of coordination: Secondary | ICD-10-CM | POA: Diagnosis not present

## 2017-05-28 DIAGNOSIS — S42215S Unspecified nondisplaced fracture of surgical neck of left humerus, sequela: Secondary | ICD-10-CM | POA: Diagnosis not present

## 2017-05-28 DIAGNOSIS — R278 Other lack of coordination: Secondary | ICD-10-CM | POA: Diagnosis not present

## 2017-05-28 DIAGNOSIS — M6281 Muscle weakness (generalized): Secondary | ICD-10-CM | POA: Diagnosis not present

## 2017-05-28 DIAGNOSIS — Z9181 History of falling: Secondary | ICD-10-CM | POA: Diagnosis not present

## 2017-06-15 DIAGNOSIS — F0281 Dementia in other diseases classified elsewhere with behavioral disturbance: Secondary | ICD-10-CM | POA: Diagnosis not present

## 2017-06-16 DIAGNOSIS — L03039 Cellulitis of unspecified toe: Secondary | ICD-10-CM | POA: Diagnosis not present

## 2017-06-17 DIAGNOSIS — E119 Type 2 diabetes mellitus without complications: Secondary | ICD-10-CM | POA: Diagnosis not present

## 2017-06-17 DIAGNOSIS — L03031 Cellulitis of right toe: Secondary | ICD-10-CM | POA: Diagnosis not present

## 2017-06-17 DIAGNOSIS — L03115 Cellulitis of right lower limb: Secondary | ICD-10-CM | POA: Diagnosis not present

## 2017-06-21 ENCOUNTER — Ambulatory Visit (INDEPENDENT_AMBULATORY_CARE_PROVIDER_SITE_OTHER): Payer: Medicare Other | Admitting: Physician Assistant

## 2017-06-29 ENCOUNTER — Ambulatory Visit (INDEPENDENT_AMBULATORY_CARE_PROVIDER_SITE_OTHER): Payer: Medicare Other

## 2017-06-29 ENCOUNTER — Ambulatory Visit (INDEPENDENT_AMBULATORY_CARE_PROVIDER_SITE_OTHER): Payer: Medicare Other | Admitting: Physician Assistant

## 2017-06-29 DIAGNOSIS — S42302D Unspecified fracture of shaft of humerus, left arm, subsequent encounter for fracture with routine healing: Secondary | ICD-10-CM | POA: Insufficient documentation

## 2017-06-29 NOTE — Progress Notes (Signed)
[name] returns today for follow-up of his left proximal humerus fracture. He states overall that his range of motion is getting better. He is having no real pain in the arm at this point.  Left arm forward flexion actively to approximately 50 I can bring him to 70. He has limited abduction and external and internal rotation of the shoulder. He has near full extension of the forearm and has full flexion. Good supination pronation. Radial pulses intact. Sensation grossly intact throughout the left hand to light touch.  Radiographs: Left humerus 2 views shows excellent consolidation of the proximal humerus fracture. Shoulders well located. No no acute fractures otherwise. Chronic mid shaft well-healed humerus fracture is evident.  Impression: 8 weeks status post conservative treatment of the left proximal humerus fracture.  Plan: We'll have him work with physical therapy on range of motion strengthening shoulder. He is activities as tolerated with the left shoulder. Questions were encouraged both patient and his daughter who was present throughout the examination a day. We'll see him back in 1 month check his range of motion of the shoulder no radiographs needed at that time.

## 2017-07-01 DIAGNOSIS — L03031 Cellulitis of right toe: Secondary | ICD-10-CM | POA: Diagnosis not present

## 2017-07-01 DIAGNOSIS — F0281 Dementia in other diseases classified elsewhere with behavioral disturbance: Secondary | ICD-10-CM | POA: Diagnosis not present

## 2017-07-01 DIAGNOSIS — E119 Type 2 diabetes mellitus without complications: Secondary | ICD-10-CM | POA: Diagnosis not present

## 2017-07-15 DIAGNOSIS — E118 Type 2 diabetes mellitus with unspecified complications: Secondary | ICD-10-CM | POA: Diagnosis not present

## 2017-07-15 DIAGNOSIS — I10 Essential (primary) hypertension: Secondary | ICD-10-CM | POA: Diagnosis not present

## 2017-07-15 DIAGNOSIS — F339 Major depressive disorder, recurrent, unspecified: Secondary | ICD-10-CM | POA: Diagnosis not present

## 2017-07-15 DIAGNOSIS — F0391 Unspecified dementia with behavioral disturbance: Secondary | ICD-10-CM | POA: Diagnosis not present

## 2017-07-15 DIAGNOSIS — I2510 Atherosclerotic heart disease of native coronary artery without angina pectoris: Secondary | ICD-10-CM | POA: Diagnosis not present

## 2017-07-20 DIAGNOSIS — F0281 Dementia in other diseases classified elsewhere with behavioral disturbance: Secondary | ICD-10-CM | POA: Diagnosis not present

## 2017-07-20 DIAGNOSIS — B351 Tinea unguium: Secondary | ICD-10-CM | POA: Diagnosis not present

## 2017-07-20 DIAGNOSIS — M79675 Pain in left toe(s): Secondary | ICD-10-CM | POA: Diagnosis not present

## 2017-07-30 DIAGNOSIS — L03039 Cellulitis of unspecified toe: Secondary | ICD-10-CM | POA: Diagnosis not present

## 2017-07-30 DIAGNOSIS — G9009 Other idiopathic peripheral autonomic neuropathy: Secondary | ICD-10-CM | POA: Diagnosis not present

## 2017-08-02 ENCOUNTER — Ambulatory Visit (INDEPENDENT_AMBULATORY_CARE_PROVIDER_SITE_OTHER): Payer: Medicare Other | Admitting: Physician Assistant

## 2017-08-02 ENCOUNTER — Encounter (INDEPENDENT_AMBULATORY_CARE_PROVIDER_SITE_OTHER): Payer: Self-pay | Admitting: Physician Assistant

## 2017-08-02 DIAGNOSIS — S42302D Unspecified fracture of shaft of humerus, left arm, subsequent encounter for fracture with routine healing: Secondary | ICD-10-CM

## 2017-08-02 DIAGNOSIS — F0281 Dementia in other diseases classified elsewhere with behavioral disturbance: Secondary | ICD-10-CM | POA: Diagnosis not present

## 2017-08-02 NOTE — Progress Notes (Signed)
[name] returns today 12 weeks out from a left proximal humerus fracture is been treated conservatively. He is overall trending towards improvement. His daughter is with him today states that therapy is still been reluctant to having put full weight on the arm.  Physical exam: Left arm for flexion to approximately 110 passively and 90 actively. Limited external rotation good internal rotation shoulder. He is nontender about the proximal shaft of the humerus.  Impression: 12 weeks status post conservative treatment left proximal humerus fracture  Plan: He will work on range of motion with physical therapy. Discussed with his daughter he is weightbearing and activities as tolerated this is also written in the report to the skilled facility which he is at. See him back in 6 weeks to check his range of motion. No radiographs at that time unless clinically indicated.

## 2017-08-03 ENCOUNTER — Telehealth (INDEPENDENT_AMBULATORY_CARE_PROVIDER_SITE_OTHER): Payer: Self-pay

## 2017-08-03 NOTE — Telephone Encounter (Signed)
LMOM for [name] of the below message

## 2017-08-03 NOTE — Telephone Encounter (Signed)
Please advise 

## 2017-08-03 NOTE — Telephone Encounter (Signed)
Physical therapy can work on bilateral lower extremity strengthening and work on his balance gait training and coordination. We can give him an order for occupational therapy to work on range of motion of his left shoulder.

## 2017-08-03 NOTE — Telephone Encounter (Signed)
[name] from [facility] where patient is staying called saying she  needs clarification of orders for patient  Can he do any P.T. For lower body strengthening? Have been using lift to get him up and daughter is wanting patient to get some strengthening for his lower ext. She stated if needed ROM for upper extremity that they would actually need an order for this because it is considered occupational therapy. Please call her to discuss.  [phone]

## 2017-08-06 DIAGNOSIS — S42215S Unspecified nondisplaced fracture of surgical neck of left humerus, sequela: Secondary | ICD-10-CM | POA: Diagnosis not present

## 2017-08-06 DIAGNOSIS — R1312 Dysphagia, oropharyngeal phase: Secondary | ICD-10-CM | POA: Diagnosis not present

## 2017-08-06 DIAGNOSIS — F0390 Unspecified dementia without behavioral disturbance: Secondary | ICD-10-CM | POA: Diagnosis not present

## 2017-08-06 DIAGNOSIS — R293 Abnormal posture: Secondary | ICD-10-CM | POA: Diagnosis not present

## 2017-08-06 DIAGNOSIS — Z9181 History of falling: Secondary | ICD-10-CM | POA: Diagnosis not present

## 2017-08-06 DIAGNOSIS — R278 Other lack of coordination: Secondary | ICD-10-CM | POA: Diagnosis not present

## 2017-08-06 DIAGNOSIS — M6281 Muscle weakness (generalized): Secondary | ICD-10-CM | POA: Diagnosis not present

## 2017-08-09 DIAGNOSIS — S42215S Unspecified nondisplaced fracture of surgical neck of left humerus, sequela: Secondary | ICD-10-CM | POA: Diagnosis not present

## 2017-08-09 DIAGNOSIS — R278 Other lack of coordination: Secondary | ICD-10-CM | POA: Diagnosis not present

## 2017-08-09 DIAGNOSIS — F0390 Unspecified dementia without behavioral disturbance: Secondary | ICD-10-CM | POA: Diagnosis not present

## 2017-08-09 DIAGNOSIS — Z9181 History of falling: Secondary | ICD-10-CM | POA: Diagnosis not present

## 2017-08-09 DIAGNOSIS — M6281 Muscle weakness (generalized): Secondary | ICD-10-CM | POA: Diagnosis not present

## 2017-08-09 DIAGNOSIS — R293 Abnormal posture: Secondary | ICD-10-CM | POA: Diagnosis not present

## 2017-08-10 DIAGNOSIS — S42215S Unspecified nondisplaced fracture of surgical neck of left humerus, sequela: Secondary | ICD-10-CM | POA: Diagnosis not present

## 2017-08-10 DIAGNOSIS — F0390 Unspecified dementia without behavioral disturbance: Secondary | ICD-10-CM | POA: Diagnosis not present

## 2017-08-10 DIAGNOSIS — L03115 Cellulitis of right lower limb: Secondary | ICD-10-CM | POA: Diagnosis not present

## 2017-08-10 DIAGNOSIS — M6281 Muscle weakness (generalized): Secondary | ICD-10-CM | POA: Diagnosis not present

## 2017-08-10 DIAGNOSIS — R293 Abnormal posture: Secondary | ICD-10-CM | POA: Diagnosis not present

## 2017-08-10 DIAGNOSIS — Z9181 History of falling: Secondary | ICD-10-CM | POA: Diagnosis not present

## 2017-08-10 DIAGNOSIS — R278 Other lack of coordination: Secondary | ICD-10-CM | POA: Diagnosis not present

## 2017-08-11 DIAGNOSIS — R293 Abnormal posture: Secondary | ICD-10-CM | POA: Diagnosis not present

## 2017-08-11 DIAGNOSIS — Z9181 History of falling: Secondary | ICD-10-CM | POA: Diagnosis not present

## 2017-08-11 DIAGNOSIS — R278 Other lack of coordination: Secondary | ICD-10-CM | POA: Diagnosis not present

## 2017-08-11 DIAGNOSIS — F0390 Unspecified dementia without behavioral disturbance: Secondary | ICD-10-CM | POA: Diagnosis not present

## 2017-08-11 DIAGNOSIS — S42215S Unspecified nondisplaced fracture of surgical neck of left humerus, sequela: Secondary | ICD-10-CM | POA: Diagnosis not present

## 2017-08-11 DIAGNOSIS — M6281 Muscle weakness (generalized): Secondary | ICD-10-CM | POA: Diagnosis not present

## 2017-08-12 DIAGNOSIS — S42215S Unspecified nondisplaced fracture of surgical neck of left humerus, sequela: Secondary | ICD-10-CM | POA: Diagnosis not present

## 2017-08-12 DIAGNOSIS — M6281 Muscle weakness (generalized): Secondary | ICD-10-CM | POA: Diagnosis not present

## 2017-08-12 DIAGNOSIS — R278 Other lack of coordination: Secondary | ICD-10-CM | POA: Diagnosis not present

## 2017-08-12 DIAGNOSIS — R293 Abnormal posture: Secondary | ICD-10-CM | POA: Diagnosis not present

## 2017-08-12 DIAGNOSIS — F0390 Unspecified dementia without behavioral disturbance: Secondary | ICD-10-CM | POA: Diagnosis not present

## 2017-08-12 DIAGNOSIS — Z9181 History of falling: Secondary | ICD-10-CM | POA: Diagnosis not present

## 2017-08-13 DIAGNOSIS — M6281 Muscle weakness (generalized): Secondary | ICD-10-CM | POA: Diagnosis not present

## 2017-08-13 DIAGNOSIS — S42215S Unspecified nondisplaced fracture of surgical neck of left humerus, sequela: Secondary | ICD-10-CM | POA: Diagnosis not present

## 2017-08-13 DIAGNOSIS — R293 Abnormal posture: Secondary | ICD-10-CM | POA: Diagnosis not present

## 2017-08-13 DIAGNOSIS — F0390 Unspecified dementia without behavioral disturbance: Secondary | ICD-10-CM | POA: Diagnosis not present

## 2017-08-13 DIAGNOSIS — R278 Other lack of coordination: Secondary | ICD-10-CM | POA: Diagnosis not present

## 2017-08-13 DIAGNOSIS — Z9181 History of falling: Secondary | ICD-10-CM | POA: Diagnosis not present

## 2017-08-16 DIAGNOSIS — I4891 Unspecified atrial fibrillation: Secondary | ICD-10-CM | POA: Diagnosis not present

## 2017-08-16 DIAGNOSIS — E119 Type 2 diabetes mellitus without complications: Secondary | ICD-10-CM | POA: Diagnosis not present

## 2017-08-16 DIAGNOSIS — M6281 Muscle weakness (generalized): Secondary | ICD-10-CM | POA: Diagnosis not present

## 2017-08-16 DIAGNOSIS — F0390 Unspecified dementia without behavioral disturbance: Secondary | ICD-10-CM | POA: Diagnosis not present

## 2017-08-16 DIAGNOSIS — R293 Abnormal posture: Secondary | ICD-10-CM | POA: Diagnosis not present

## 2017-08-16 DIAGNOSIS — S42215S Unspecified nondisplaced fracture of surgical neck of left humerus, sequela: Secondary | ICD-10-CM | POA: Diagnosis not present

## 2017-08-16 DIAGNOSIS — E785 Hyperlipidemia, unspecified: Secondary | ICD-10-CM | POA: Diagnosis not present

## 2017-08-16 DIAGNOSIS — J00 Acute nasopharyngitis [common cold]: Secondary | ICD-10-CM | POA: Diagnosis not present

## 2017-08-16 DIAGNOSIS — Z9181 History of falling: Secondary | ICD-10-CM | POA: Diagnosis not present

## 2017-08-16 DIAGNOSIS — R278 Other lack of coordination: Secondary | ICD-10-CM | POA: Diagnosis not present

## 2017-08-17 DIAGNOSIS — R278 Other lack of coordination: Secondary | ICD-10-CM | POA: Diagnosis not present

## 2017-08-17 DIAGNOSIS — S42215S Unspecified nondisplaced fracture of surgical neck of left humerus, sequela: Secondary | ICD-10-CM | POA: Diagnosis not present

## 2017-08-17 DIAGNOSIS — M6281 Muscle weakness (generalized): Secondary | ICD-10-CM | POA: Diagnosis not present

## 2017-08-17 DIAGNOSIS — R293 Abnormal posture: Secondary | ICD-10-CM | POA: Diagnosis not present

## 2017-08-17 DIAGNOSIS — F0390 Unspecified dementia without behavioral disturbance: Secondary | ICD-10-CM | POA: Diagnosis not present

## 2017-08-17 DIAGNOSIS — Z9181 History of falling: Secondary | ICD-10-CM | POA: Diagnosis not present

## 2017-08-18 DIAGNOSIS — Z9181 History of falling: Secondary | ICD-10-CM | POA: Diagnosis not present

## 2017-08-18 DIAGNOSIS — R293 Abnormal posture: Secondary | ICD-10-CM | POA: Diagnosis not present

## 2017-08-18 DIAGNOSIS — M6281 Muscle weakness (generalized): Secondary | ICD-10-CM | POA: Diagnosis not present

## 2017-08-18 DIAGNOSIS — R278 Other lack of coordination: Secondary | ICD-10-CM | POA: Diagnosis not present

## 2017-08-18 DIAGNOSIS — F0390 Unspecified dementia without behavioral disturbance: Secondary | ICD-10-CM | POA: Diagnosis not present

## 2017-08-18 DIAGNOSIS — S42215S Unspecified nondisplaced fracture of surgical neck of left humerus, sequela: Secondary | ICD-10-CM | POA: Diagnosis not present

## 2017-08-19 DIAGNOSIS — R293 Abnormal posture: Secondary | ICD-10-CM | POA: Diagnosis not present

## 2017-08-19 DIAGNOSIS — Z9181 History of falling: Secondary | ICD-10-CM | POA: Diagnosis not present

## 2017-08-19 DIAGNOSIS — M6281 Muscle weakness (generalized): Secondary | ICD-10-CM | POA: Diagnosis not present

## 2017-08-19 DIAGNOSIS — S42215S Unspecified nondisplaced fracture of surgical neck of left humerus, sequela: Secondary | ICD-10-CM | POA: Diagnosis not present

## 2017-08-19 DIAGNOSIS — R278 Other lack of coordination: Secondary | ICD-10-CM | POA: Diagnosis not present

## 2017-08-19 DIAGNOSIS — F0390 Unspecified dementia without behavioral disturbance: Secondary | ICD-10-CM | POA: Diagnosis not present

## 2017-08-20 DIAGNOSIS — M6281 Muscle weakness (generalized): Secondary | ICD-10-CM | POA: Diagnosis not present

## 2017-08-20 DIAGNOSIS — Z9181 History of falling: Secondary | ICD-10-CM | POA: Diagnosis not present

## 2017-08-20 DIAGNOSIS — S42215S Unspecified nondisplaced fracture of surgical neck of left humerus, sequela: Secondary | ICD-10-CM | POA: Diagnosis not present

## 2017-08-20 DIAGNOSIS — F0390 Unspecified dementia without behavioral disturbance: Secondary | ICD-10-CM | POA: Diagnosis not present

## 2017-08-20 DIAGNOSIS — R278 Other lack of coordination: Secondary | ICD-10-CM | POA: Diagnosis not present

## 2017-08-20 DIAGNOSIS — R293 Abnormal posture: Secondary | ICD-10-CM | POA: Diagnosis not present

## 2017-08-23 DIAGNOSIS — R293 Abnormal posture: Secondary | ICD-10-CM | POA: Diagnosis not present

## 2017-08-23 DIAGNOSIS — R1312 Dysphagia, oropharyngeal phase: Secondary | ICD-10-CM | POA: Diagnosis not present

## 2017-08-23 DIAGNOSIS — S42215S Unspecified nondisplaced fracture of surgical neck of left humerus, sequela: Secondary | ICD-10-CM | POA: Diagnosis not present

## 2017-08-23 DIAGNOSIS — R609 Edema, unspecified: Secondary | ICD-10-CM | POA: Diagnosis not present

## 2017-08-23 DIAGNOSIS — F0390 Unspecified dementia without behavioral disturbance: Secondary | ICD-10-CM | POA: Diagnosis not present

## 2017-08-23 DIAGNOSIS — M6281 Muscle weakness (generalized): Secondary | ICD-10-CM | POA: Diagnosis not present

## 2017-08-23 DIAGNOSIS — Z9181 History of falling: Secondary | ICD-10-CM | POA: Diagnosis not present

## 2017-08-23 DIAGNOSIS — R278 Other lack of coordination: Secondary | ICD-10-CM | POA: Diagnosis not present

## 2017-08-24 DIAGNOSIS — M6281 Muscle weakness (generalized): Secondary | ICD-10-CM | POA: Diagnosis not present

## 2017-08-24 DIAGNOSIS — S42215S Unspecified nondisplaced fracture of surgical neck of left humerus, sequela: Secondary | ICD-10-CM | POA: Diagnosis not present

## 2017-08-24 DIAGNOSIS — F0390 Unspecified dementia without behavioral disturbance: Secondary | ICD-10-CM | POA: Diagnosis not present

## 2017-08-24 DIAGNOSIS — Z9181 History of falling: Secondary | ICD-10-CM | POA: Diagnosis not present

## 2017-08-24 DIAGNOSIS — R293 Abnormal posture: Secondary | ICD-10-CM | POA: Diagnosis not present

## 2017-08-24 DIAGNOSIS — R278 Other lack of coordination: Secondary | ICD-10-CM | POA: Diagnosis not present

## 2017-08-25 DIAGNOSIS — M6281 Muscle weakness (generalized): Secondary | ICD-10-CM | POA: Diagnosis not present

## 2017-08-25 DIAGNOSIS — F0390 Unspecified dementia without behavioral disturbance: Secondary | ICD-10-CM | POA: Diagnosis not present

## 2017-08-25 DIAGNOSIS — R293 Abnormal posture: Secondary | ICD-10-CM | POA: Diagnosis not present

## 2017-08-25 DIAGNOSIS — Z9181 History of falling: Secondary | ICD-10-CM | POA: Diagnosis not present

## 2017-08-25 DIAGNOSIS — S42215S Unspecified nondisplaced fracture of surgical neck of left humerus, sequela: Secondary | ICD-10-CM | POA: Diagnosis not present

## 2017-08-25 DIAGNOSIS — R278 Other lack of coordination: Secondary | ICD-10-CM | POA: Diagnosis not present

## 2017-08-26 DIAGNOSIS — M6281 Muscle weakness (generalized): Secondary | ICD-10-CM | POA: Diagnosis not present

## 2017-08-26 DIAGNOSIS — D649 Anemia, unspecified: Secondary | ICD-10-CM | POA: Diagnosis not present

## 2017-08-26 DIAGNOSIS — R293 Abnormal posture: Secondary | ICD-10-CM | POA: Diagnosis not present

## 2017-08-26 DIAGNOSIS — I10 Essential (primary) hypertension: Secondary | ICD-10-CM | POA: Diagnosis not present

## 2017-08-26 DIAGNOSIS — E119 Type 2 diabetes mellitus without complications: Secondary | ICD-10-CM | POA: Diagnosis not present

## 2017-08-26 DIAGNOSIS — I4891 Unspecified atrial fibrillation: Secondary | ICD-10-CM | POA: Diagnosis not present

## 2017-08-26 DIAGNOSIS — S42215S Unspecified nondisplaced fracture of surgical neck of left humerus, sequela: Secondary | ICD-10-CM | POA: Diagnosis not present

## 2017-08-26 DIAGNOSIS — F0390 Unspecified dementia without behavioral disturbance: Secondary | ICD-10-CM | POA: Diagnosis not present

## 2017-08-26 DIAGNOSIS — E559 Vitamin D deficiency, unspecified: Secondary | ICD-10-CM | POA: Diagnosis not present

## 2017-08-26 DIAGNOSIS — F329 Major depressive disorder, single episode, unspecified: Secondary | ICD-10-CM | POA: Diagnosis not present

## 2017-08-26 DIAGNOSIS — Z9181 History of falling: Secondary | ICD-10-CM | POA: Diagnosis not present

## 2017-08-26 DIAGNOSIS — E039 Hypothyroidism, unspecified: Secondary | ICD-10-CM | POA: Diagnosis not present

## 2017-08-26 DIAGNOSIS — R278 Other lack of coordination: Secondary | ICD-10-CM | POA: Diagnosis not present

## 2017-08-26 DIAGNOSIS — I509 Heart failure, unspecified: Secondary | ICD-10-CM | POA: Diagnosis not present

## 2017-08-27 DIAGNOSIS — R278 Other lack of coordination: Secondary | ICD-10-CM | POA: Diagnosis not present

## 2017-08-27 DIAGNOSIS — R293 Abnormal posture: Secondary | ICD-10-CM | POA: Diagnosis not present

## 2017-08-27 DIAGNOSIS — M6281 Muscle weakness (generalized): Secondary | ICD-10-CM | POA: Diagnosis not present

## 2017-08-27 DIAGNOSIS — F0390 Unspecified dementia without behavioral disturbance: Secondary | ICD-10-CM | POA: Diagnosis not present

## 2017-08-27 DIAGNOSIS — S42215S Unspecified nondisplaced fracture of surgical neck of left humerus, sequela: Secondary | ICD-10-CM | POA: Diagnosis not present

## 2017-08-27 DIAGNOSIS — Z9181 History of falling: Secondary | ICD-10-CM | POA: Diagnosis not present

## 2017-09-06 DIAGNOSIS — E119 Type 2 diabetes mellitus without complications: Secondary | ICD-10-CM | POA: Diagnosis not present

## 2017-09-06 DIAGNOSIS — I4891 Unspecified atrial fibrillation: Secondary | ICD-10-CM | POA: Diagnosis not present

## 2017-09-06 DIAGNOSIS — E039 Hypothyroidism, unspecified: Secondary | ICD-10-CM | POA: Diagnosis not present

## 2017-09-16 ENCOUNTER — Ambulatory Visit (INDEPENDENT_AMBULATORY_CARE_PROVIDER_SITE_OTHER): Payer: Medicare Other | Admitting: Physician Assistant

## 2017-09-21 DIAGNOSIS — M79675 Pain in left toe(s): Secondary | ICD-10-CM | POA: Diagnosis not present

## 2017-09-21 DIAGNOSIS — B351 Tinea unguium: Secondary | ICD-10-CM | POA: Diagnosis not present

## 2017-09-21 DIAGNOSIS — M79674 Pain in right toe(s): Secondary | ICD-10-CM | POA: Diagnosis not present

## 2017-09-27 DIAGNOSIS — F0281 Dementia in other diseases classified elsewhere with behavioral disturbance: Secondary | ICD-10-CM | POA: Diagnosis not present

## 2017-10-07 ENCOUNTER — Ambulatory Visit (INDEPENDENT_AMBULATORY_CARE_PROVIDER_SITE_OTHER): Payer: Medicare Other | Admitting: Orthopaedic Surgery

## 2017-10-11 ENCOUNTER — Encounter (INDEPENDENT_AMBULATORY_CARE_PROVIDER_SITE_OTHER): Payer: Self-pay | Admitting: Orthopaedic Surgery

## 2017-10-11 ENCOUNTER — Ambulatory Visit (INDEPENDENT_AMBULATORY_CARE_PROVIDER_SITE_OTHER): Payer: Medicare Other | Admitting: Orthopaedic Surgery

## 2017-10-11 DIAGNOSIS — S30811A Abrasion of abdominal wall, initial encounter: Secondary | ICD-10-CM | POA: Diagnosis not present

## 2017-10-11 DIAGNOSIS — S42342D Displaced spiral fracture of shaft of humerus, left arm, subsequent encounter for fracture with routine healing: Secondary | ICD-10-CM

## 2017-10-11 NOTE — Progress Notes (Signed)
Doing well status post humeral shaft fracture of the left side with a long spiral fracture.  He is back to his baseline health.  He says the stiffness is doing well and has no pain.  He is someone who does not really.  He would love to walk better.  On exam I can put his left shoulder the range of motion is very stiff but is pain-free.  If there is evidence of a rotator cuff as well.  We do not need x-rays shoulder proximal humerus today because less likely she has been scheduled.  At this point follow-up as needed.  I gave notes for the therapist at his facility to be as aggressive as possible on bilateral upper and lower extremity strengthening.

## 2017-12-07 DIAGNOSIS — C61 Malignant neoplasm of prostate: Secondary | ICD-10-CM | POA: Diagnosis not present

## 2017-12-07 DIAGNOSIS — I2510 Atherosclerotic heart disease of native coronary artery without angina pectoris: Secondary | ICD-10-CM | POA: Diagnosis not present

## 2017-12-07 DIAGNOSIS — E119 Type 2 diabetes mellitus without complications: Secondary | ICD-10-CM | POA: Diagnosis not present

## 2017-12-07 DIAGNOSIS — Z515 Encounter for palliative care: Secondary | ICD-10-CM | POA: Diagnosis not present

## 2017-12-07 DIAGNOSIS — F0390 Unspecified dementia without behavioral disturbance: Secondary | ICD-10-CM | POA: Diagnosis not present

## 2017-12-26 IMAGING — CR DG HUMERUS 2V *L*
3 series · 3 of 3 positions shown · non-contrast
Comparison: Left shoulder radiographs 07/05/2012. Left humerus
radiographs 08/08/2008.

CLINICAL DATA: Fall in bathroom. Left shoulder pain. Previous left
humerus fracture.

EXAM:
LEFT HUMERUS - 2+ VIEW

[x humerus lat left (1 of 2)]
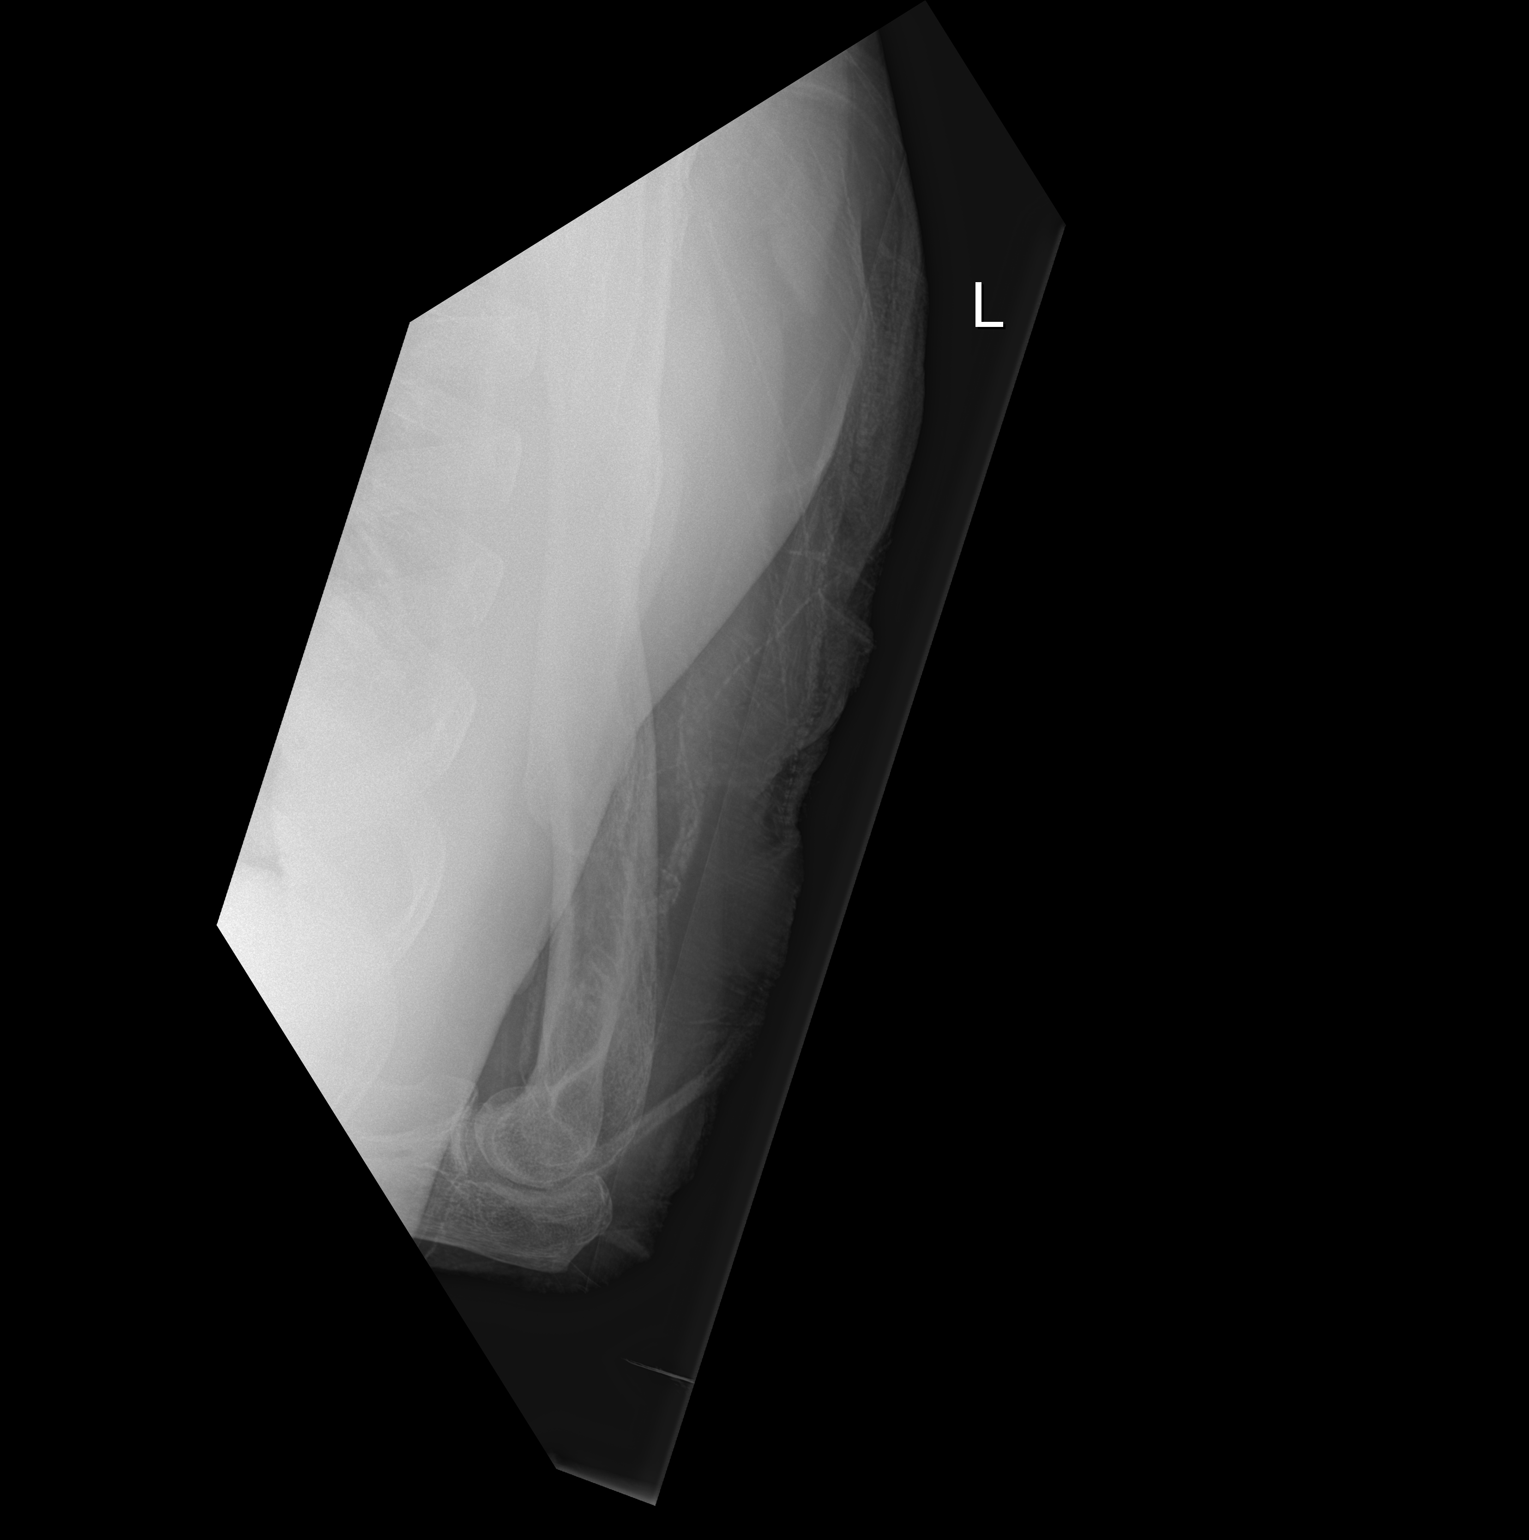

[x humerus lat left (2 of 2)]
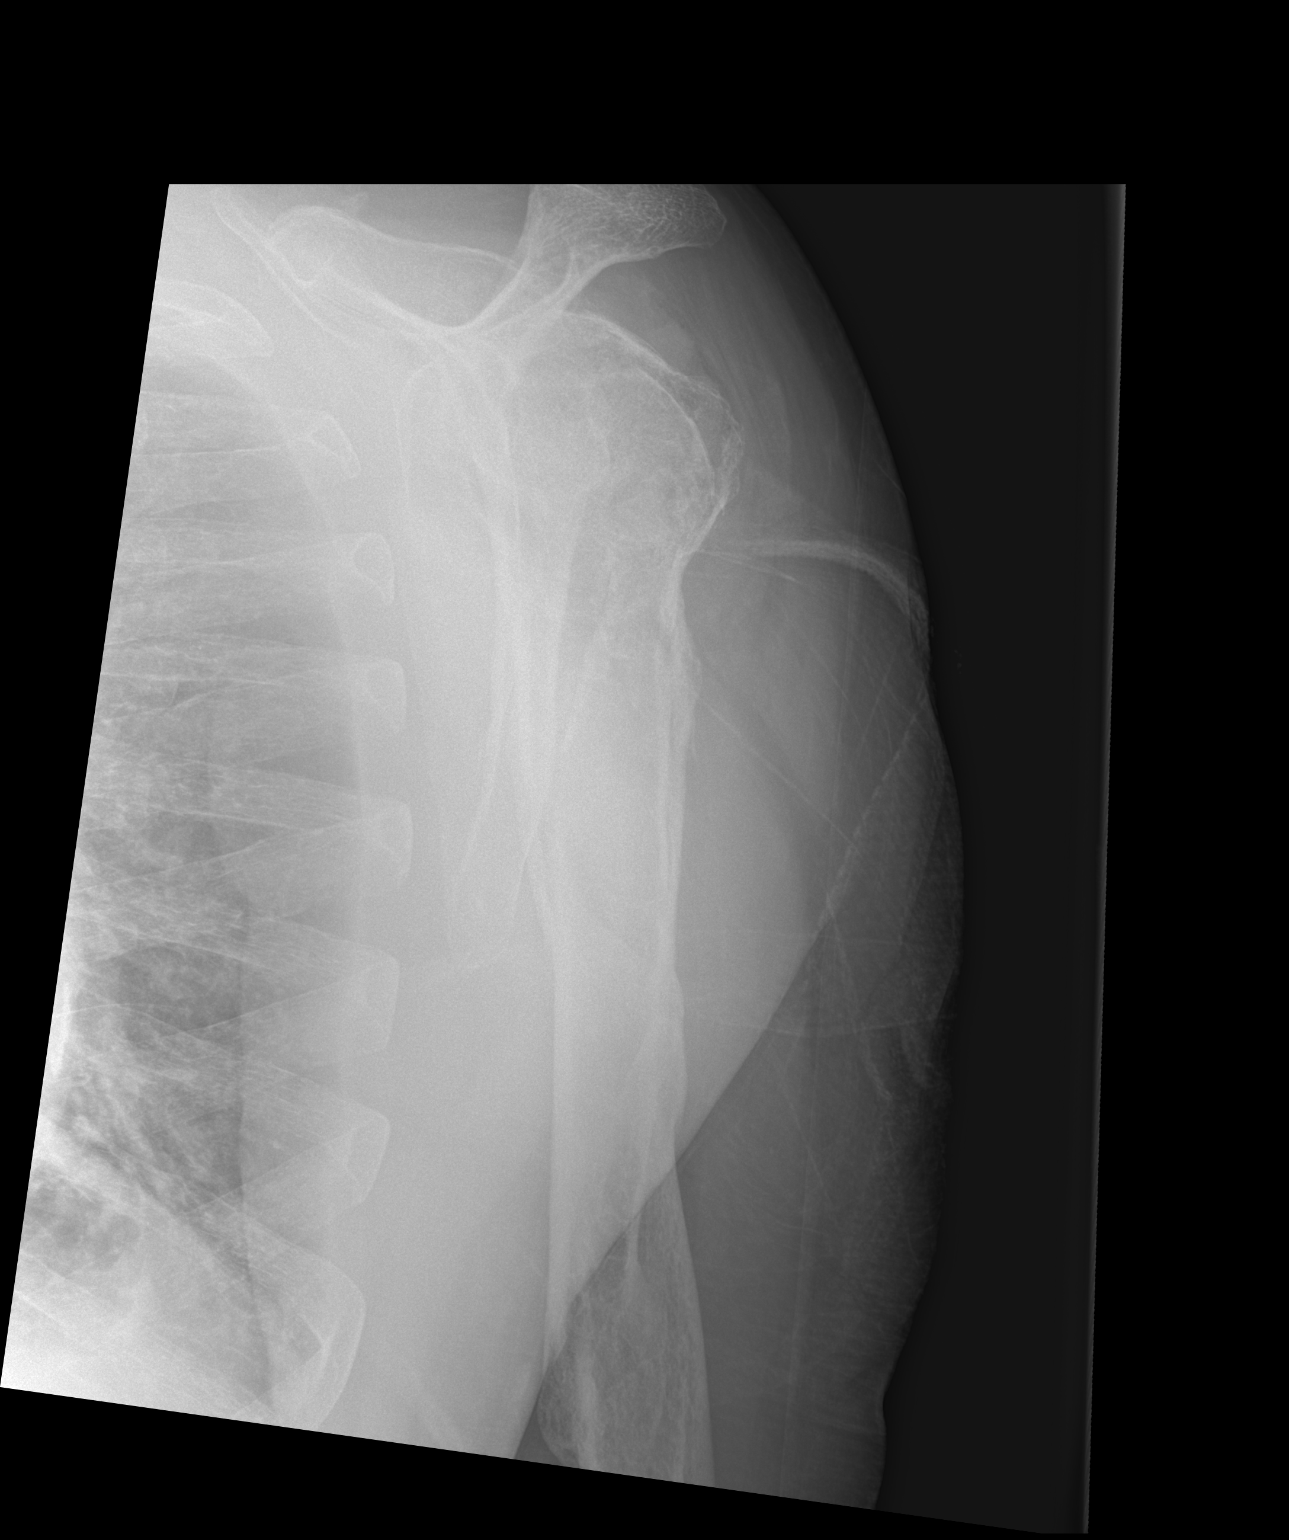

[x humerus ap left]
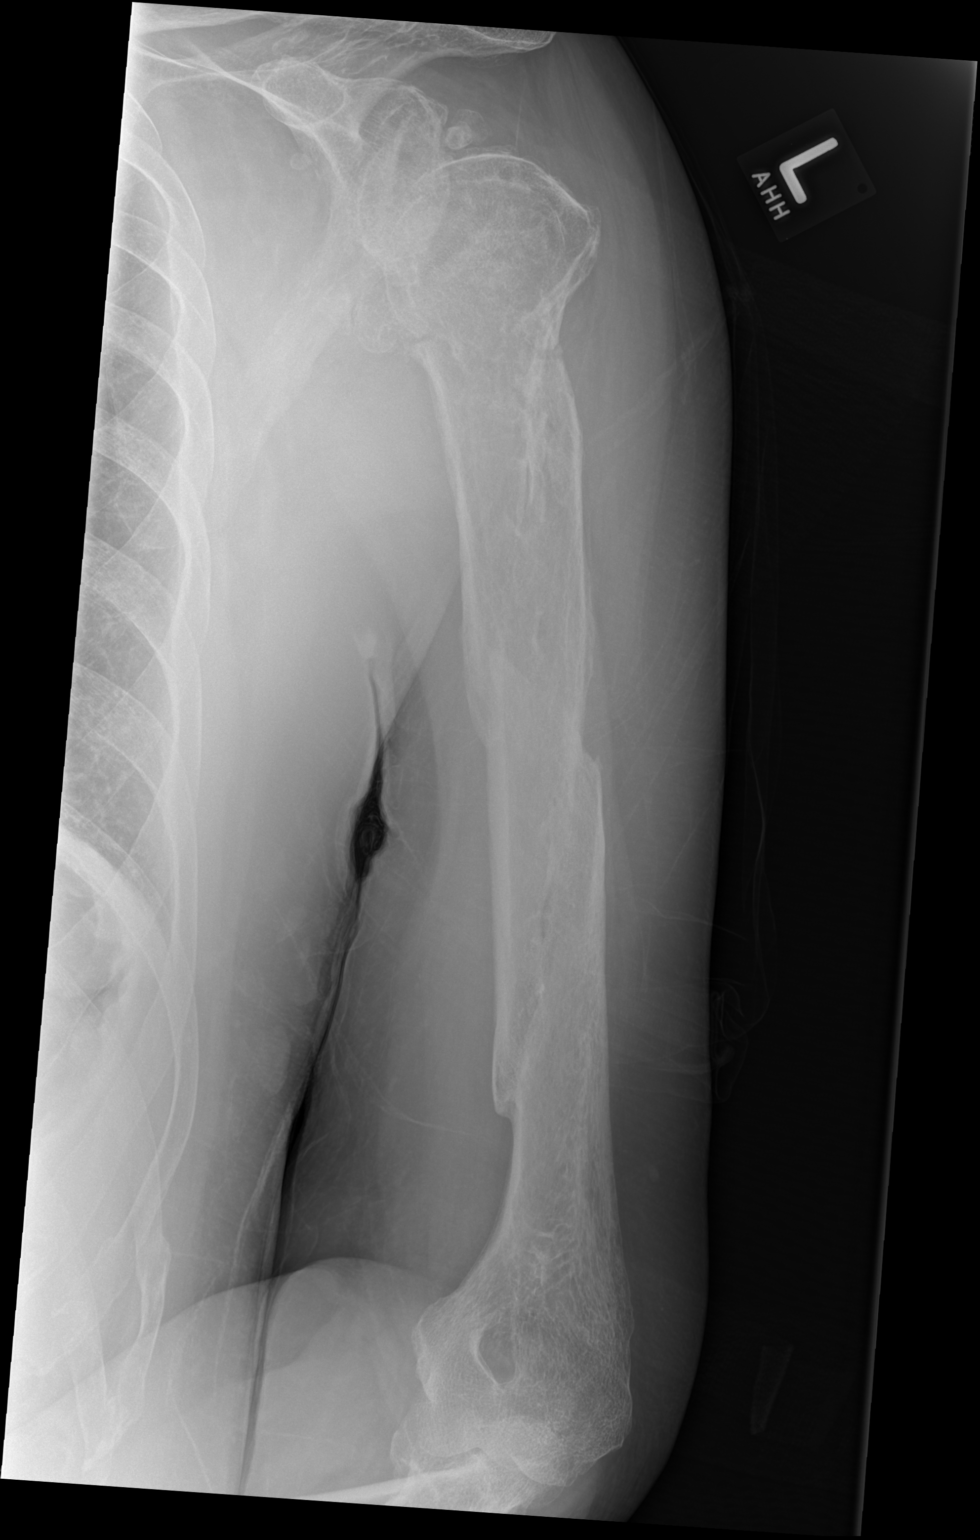

[3 of 3 positions shown; findings below may reference images not displayed]

FINDINGS: Healed complex midshaft humerus fractures are again seen. A lucency
surgical neck of the proximal left humerus is concerning for acute
fracture. The elbow and shoulder are located.
IMPRESSION: 1. Lucency concerning for acute nondisplaced fracture of the
surgical neck in the left humerus.
2. Healed complex midshaft fractures.
3. Advanced degenerative changes at the left shoulder.

## 2018-01-14 DIAGNOSIS — F329 Major depressive disorder, single episode, unspecified: Secondary | ICD-10-CM | POA: Diagnosis not present

## 2018-01-14 DIAGNOSIS — F419 Anxiety disorder, unspecified: Secondary | ICD-10-CM | POA: Diagnosis not present

## 2018-01-18 DIAGNOSIS — B351 Tinea unguium: Secondary | ICD-10-CM | POA: Diagnosis not present

## 2018-01-18 DIAGNOSIS — R600 Localized edema: Secondary | ICD-10-CM | POA: Diagnosis not present

## 2018-01-18 DIAGNOSIS — I739 Peripheral vascular disease, unspecified: Secondary | ICD-10-CM | POA: Diagnosis not present

## 2018-01-20 DIAGNOSIS — E039 Hypothyroidism, unspecified: Secondary | ICD-10-CM | POA: Diagnosis not present

## 2018-01-20 DIAGNOSIS — I4891 Unspecified atrial fibrillation: Secondary | ICD-10-CM | POA: Diagnosis not present

## 2018-01-20 DIAGNOSIS — F329 Major depressive disorder, single episode, unspecified: Secondary | ICD-10-CM | POA: Diagnosis not present

## 2018-01-20 DIAGNOSIS — E789 Disorder of lipoprotein metabolism, unspecified: Secondary | ICD-10-CM | POA: Diagnosis not present

## 2018-01-20 DIAGNOSIS — E119 Type 2 diabetes mellitus without complications: Secondary | ICD-10-CM | POA: Diagnosis not present

## 2018-01-20 DIAGNOSIS — I10 Essential (primary) hypertension: Secondary | ICD-10-CM | POA: Diagnosis not present

## 2018-01-21 DIAGNOSIS — L570 Actinic keratosis: Secondary | ICD-10-CM | POA: Diagnosis not present

## 2018-01-21 DIAGNOSIS — D044 Carcinoma in situ of skin of scalp and neck: Secondary | ICD-10-CM | POA: Diagnosis not present

## 2018-01-21 DIAGNOSIS — D485 Neoplasm of uncertain behavior of skin: Secondary | ICD-10-CM | POA: Diagnosis not present

## 2018-01-21 DIAGNOSIS — L821 Other seborrheic keratosis: Secondary | ICD-10-CM | POA: Diagnosis not present

## 2018-02-24 DIAGNOSIS — E119 Type 2 diabetes mellitus without complications: Secondary | ICD-10-CM | POA: Diagnosis not present

## 2018-02-24 DIAGNOSIS — E039 Hypothyroidism, unspecified: Secondary | ICD-10-CM | POA: Diagnosis not present

## 2018-02-24 DIAGNOSIS — D649 Anemia, unspecified: Secondary | ICD-10-CM | POA: Diagnosis not present

## 2018-02-24 DIAGNOSIS — I369 Nonrheumatic tricuspid valve disorder, unspecified: Secondary | ICD-10-CM | POA: Diagnosis not present

## 2018-02-24 DIAGNOSIS — I10 Essential (primary) hypertension: Secondary | ICD-10-CM | POA: Diagnosis not present

## 2018-02-24 DIAGNOSIS — F0390 Unspecified dementia without behavioral disturbance: Secondary | ICD-10-CM | POA: Diagnosis not present

## 2018-02-24 DIAGNOSIS — I4891 Unspecified atrial fibrillation: Secondary | ICD-10-CM | POA: Diagnosis not present

## 2018-02-24 DIAGNOSIS — E559 Vitamin D deficiency, unspecified: Secondary | ICD-10-CM | POA: Diagnosis not present

## 2018-02-24 DIAGNOSIS — M810 Age-related osteoporosis without current pathological fracture: Secondary | ICD-10-CM | POA: Diagnosis not present

## 2018-02-24 DIAGNOSIS — I2510 Atherosclerotic heart disease of native coronary artery without angina pectoris: Secondary | ICD-10-CM | POA: Diagnosis not present

## 2018-02-26 DIAGNOSIS — F0390 Unspecified dementia without behavioral disturbance: Secondary | ICD-10-CM | POA: Diagnosis not present

## 2018-02-26 DIAGNOSIS — M79671 Pain in right foot: Secondary | ICD-10-CM | POA: Diagnosis not present

## 2018-02-26 DIAGNOSIS — M24572 Contracture, left ankle: Secondary | ICD-10-CM | POA: Diagnosis not present

## 2018-02-26 DIAGNOSIS — M6281 Muscle weakness (generalized): Secondary | ICD-10-CM | POA: Diagnosis not present

## 2018-02-26 DIAGNOSIS — M24571 Contracture, right ankle: Secondary | ICD-10-CM | POA: Diagnosis not present

## 2018-02-28 DIAGNOSIS — M79671 Pain in right foot: Secondary | ICD-10-CM | POA: Diagnosis not present

## 2018-02-28 DIAGNOSIS — M6281 Muscle weakness (generalized): Secondary | ICD-10-CM | POA: Diagnosis not present

## 2018-02-28 DIAGNOSIS — M24572 Contracture, left ankle: Secondary | ICD-10-CM | POA: Diagnosis not present

## 2018-02-28 DIAGNOSIS — F0390 Unspecified dementia without behavioral disturbance: Secondary | ICD-10-CM | POA: Diagnosis not present

## 2018-02-28 DIAGNOSIS — M24571 Contracture, right ankle: Secondary | ICD-10-CM | POA: Diagnosis not present

## 2018-03-03 DIAGNOSIS — M6281 Muscle weakness (generalized): Secondary | ICD-10-CM | POA: Diagnosis not present

## 2018-03-03 DIAGNOSIS — M24571 Contracture, right ankle: Secondary | ICD-10-CM | POA: Diagnosis not present

## 2018-03-03 DIAGNOSIS — M79671 Pain in right foot: Secondary | ICD-10-CM | POA: Diagnosis not present

## 2018-03-03 DIAGNOSIS — F0390 Unspecified dementia without behavioral disturbance: Secondary | ICD-10-CM | POA: Diagnosis not present

## 2018-03-03 DIAGNOSIS — M24572 Contracture, left ankle: Secondary | ICD-10-CM | POA: Diagnosis not present

## 2018-03-04 DIAGNOSIS — E119 Type 2 diabetes mellitus without complications: Secondary | ICD-10-CM | POA: Diagnosis not present

## 2018-03-04 DIAGNOSIS — Z794 Long term (current) use of insulin: Secondary | ICD-10-CM | POA: Diagnosis not present

## 2018-03-04 DIAGNOSIS — M810 Age-related osteoporosis without current pathological fracture: Secondary | ICD-10-CM | POA: Diagnosis not present

## 2018-03-04 DIAGNOSIS — Z87891 Personal history of nicotine dependence: Secondary | ICD-10-CM | POA: Diagnosis not present

## 2018-03-04 DIAGNOSIS — I5032 Chronic diastolic (congestive) heart failure: Secondary | ICD-10-CM | POA: Diagnosis not present

## 2018-03-04 DIAGNOSIS — I2510 Atherosclerotic heart disease of native coronary artery without angina pectoris: Secondary | ICD-10-CM | POA: Diagnosis not present

## 2018-03-04 DIAGNOSIS — F0391 Unspecified dementia with behavioral disturbance: Secondary | ICD-10-CM | POA: Diagnosis not present

## 2018-03-04 DIAGNOSIS — I110 Hypertensive heart disease with heart failure: Secondary | ICD-10-CM | POA: Diagnosis not present

## 2018-03-04 DIAGNOSIS — C9110 Chronic lymphocytic leukemia of B-cell type not having achieved remission: Secondary | ICD-10-CM | POA: Diagnosis not present

## 2018-03-04 DIAGNOSIS — Z955 Presence of coronary angioplasty implant and graft: Secondary | ICD-10-CM | POA: Diagnosis not present

## 2018-03-04 DIAGNOSIS — C61 Malignant neoplasm of prostate: Secondary | ICD-10-CM | POA: Diagnosis not present

## 2018-03-05 DIAGNOSIS — I110 Hypertensive heart disease with heart failure: Secondary | ICD-10-CM | POA: Diagnosis not present

## 2018-03-05 DIAGNOSIS — Z955 Presence of coronary angioplasty implant and graft: Secondary | ICD-10-CM | POA: Diagnosis not present

## 2018-03-05 DIAGNOSIS — Z794 Long term (current) use of insulin: Secondary | ICD-10-CM | POA: Diagnosis not present

## 2018-03-05 DIAGNOSIS — I2510 Atherosclerotic heart disease of native coronary artery without angina pectoris: Secondary | ICD-10-CM | POA: Diagnosis not present

## 2018-03-05 DIAGNOSIS — I5032 Chronic diastolic (congestive) heart failure: Secondary | ICD-10-CM | POA: Diagnosis not present

## 2018-03-05 DIAGNOSIS — E119 Type 2 diabetes mellitus without complications: Secondary | ICD-10-CM | POA: Diagnosis not present

## 2018-03-06 DIAGNOSIS — I110 Hypertensive heart disease with heart failure: Secondary | ICD-10-CM | POA: Diagnosis not present

## 2018-03-06 DIAGNOSIS — E119 Type 2 diabetes mellitus without complications: Secondary | ICD-10-CM | POA: Diagnosis not present

## 2018-03-06 DIAGNOSIS — Z794 Long term (current) use of insulin: Secondary | ICD-10-CM | POA: Diagnosis not present

## 2018-03-06 DIAGNOSIS — Z955 Presence of coronary angioplasty implant and graft: Secondary | ICD-10-CM | POA: Diagnosis not present

## 2018-03-06 DIAGNOSIS — I2510 Atherosclerotic heart disease of native coronary artery without angina pectoris: Secondary | ICD-10-CM | POA: Diagnosis not present

## 2018-03-06 DIAGNOSIS — I5032 Chronic diastolic (congestive) heart failure: Secondary | ICD-10-CM | POA: Diagnosis not present

## 2018-03-07 DIAGNOSIS — Z955 Presence of coronary angioplasty implant and graft: Secondary | ICD-10-CM | POA: Diagnosis not present

## 2018-03-07 DIAGNOSIS — E119 Type 2 diabetes mellitus without complications: Secondary | ICD-10-CM | POA: Diagnosis not present

## 2018-03-07 DIAGNOSIS — Z794 Long term (current) use of insulin: Secondary | ICD-10-CM | POA: Diagnosis not present

## 2018-03-07 DIAGNOSIS — I2510 Atherosclerotic heart disease of native coronary artery without angina pectoris: Secondary | ICD-10-CM | POA: Diagnosis not present

## 2018-03-07 DIAGNOSIS — I5032 Chronic diastolic (congestive) heart failure: Secondary | ICD-10-CM | POA: Diagnosis not present

## 2018-03-07 DIAGNOSIS — I110 Hypertensive heart disease with heart failure: Secondary | ICD-10-CM | POA: Diagnosis not present

## 2018-03-08 DIAGNOSIS — E119 Type 2 diabetes mellitus without complications: Secondary | ICD-10-CM | POA: Diagnosis not present

## 2018-03-08 DIAGNOSIS — I2510 Atherosclerotic heart disease of native coronary artery without angina pectoris: Secondary | ICD-10-CM | POA: Diagnosis not present

## 2018-03-08 DIAGNOSIS — Z794 Long term (current) use of insulin: Secondary | ICD-10-CM | POA: Diagnosis not present

## 2018-03-08 DIAGNOSIS — I5032 Chronic diastolic (congestive) heart failure: Secondary | ICD-10-CM | POA: Diagnosis not present

## 2018-03-08 DIAGNOSIS — I110 Hypertensive heart disease with heart failure: Secondary | ICD-10-CM | POA: Diagnosis not present

## 2018-03-08 DIAGNOSIS — Z955 Presence of coronary angioplasty implant and graft: Secondary | ICD-10-CM | POA: Diagnosis not present

## 2018-03-11 DIAGNOSIS — Z955 Presence of coronary angioplasty implant and graft: Secondary | ICD-10-CM | POA: Diagnosis not present

## 2018-03-11 DIAGNOSIS — I110 Hypertensive heart disease with heart failure: Secondary | ICD-10-CM | POA: Diagnosis not present

## 2018-03-11 DIAGNOSIS — I2510 Atherosclerotic heart disease of native coronary artery without angina pectoris: Secondary | ICD-10-CM | POA: Diagnosis not present

## 2018-03-11 DIAGNOSIS — E119 Type 2 diabetes mellitus without complications: Secondary | ICD-10-CM | POA: Diagnosis not present

## 2018-03-11 DIAGNOSIS — Z794 Long term (current) use of insulin: Secondary | ICD-10-CM | POA: Diagnosis not present

## 2018-03-11 DIAGNOSIS — I5032 Chronic diastolic (congestive) heart failure: Secondary | ICD-10-CM | POA: Diagnosis not present

## 2018-03-14 DIAGNOSIS — Z955 Presence of coronary angioplasty implant and graft: Secondary | ICD-10-CM | POA: Diagnosis not present

## 2018-03-14 DIAGNOSIS — E119 Type 2 diabetes mellitus without complications: Secondary | ICD-10-CM | POA: Diagnosis not present

## 2018-03-14 DIAGNOSIS — Z794 Long term (current) use of insulin: Secondary | ICD-10-CM | POA: Diagnosis not present

## 2018-03-14 DIAGNOSIS — I5032 Chronic diastolic (congestive) heart failure: Secondary | ICD-10-CM | POA: Diagnosis not present

## 2018-03-14 DIAGNOSIS — I2510 Atherosclerotic heart disease of native coronary artery without angina pectoris: Secondary | ICD-10-CM | POA: Diagnosis not present

## 2018-03-14 DIAGNOSIS — I110 Hypertensive heart disease with heart failure: Secondary | ICD-10-CM | POA: Diagnosis not present

## 2018-03-16 DIAGNOSIS — Z955 Presence of coronary angioplasty implant and graft: Secondary | ICD-10-CM | POA: Diagnosis not present

## 2018-03-16 DIAGNOSIS — E119 Type 2 diabetes mellitus without complications: Secondary | ICD-10-CM | POA: Diagnosis not present

## 2018-03-16 DIAGNOSIS — I5032 Chronic diastolic (congestive) heart failure: Secondary | ICD-10-CM | POA: Diagnosis not present

## 2018-03-16 DIAGNOSIS — I110 Hypertensive heart disease with heart failure: Secondary | ICD-10-CM | POA: Diagnosis not present

## 2018-03-16 DIAGNOSIS — Z794 Long term (current) use of insulin: Secondary | ICD-10-CM | POA: Diagnosis not present

## 2018-03-16 DIAGNOSIS — I2510 Atherosclerotic heart disease of native coronary artery without angina pectoris: Secondary | ICD-10-CM | POA: Diagnosis not present

## 2018-03-18 DIAGNOSIS — I5032 Chronic diastolic (congestive) heart failure: Secondary | ICD-10-CM | POA: Diagnosis not present

## 2018-03-18 DIAGNOSIS — Z955 Presence of coronary angioplasty implant and graft: Secondary | ICD-10-CM | POA: Diagnosis not present

## 2018-03-18 DIAGNOSIS — Z794 Long term (current) use of insulin: Secondary | ICD-10-CM | POA: Diagnosis not present

## 2018-03-18 DIAGNOSIS — I110 Hypertensive heart disease with heart failure: Secondary | ICD-10-CM | POA: Diagnosis not present

## 2018-03-18 DIAGNOSIS — E119 Type 2 diabetes mellitus without complications: Secondary | ICD-10-CM | POA: Diagnosis not present

## 2018-03-18 DIAGNOSIS — I2510 Atherosclerotic heart disease of native coronary artery without angina pectoris: Secondary | ICD-10-CM | POA: Diagnosis not present

## 2018-03-22 DIAGNOSIS — I2510 Atherosclerotic heart disease of native coronary artery without angina pectoris: Secondary | ICD-10-CM | POA: Diagnosis not present

## 2018-03-22 DIAGNOSIS — E119 Type 2 diabetes mellitus without complications: Secondary | ICD-10-CM | POA: Diagnosis not present

## 2018-03-22 DIAGNOSIS — Z955 Presence of coronary angioplasty implant and graft: Secondary | ICD-10-CM | POA: Diagnosis not present

## 2018-03-22 DIAGNOSIS — E118 Type 2 diabetes mellitus with unspecified complications: Secondary | ICD-10-CM | POA: Diagnosis not present

## 2018-03-22 DIAGNOSIS — I679 Cerebrovascular disease, unspecified: Secondary | ICD-10-CM | POA: Diagnosis not present

## 2018-03-22 DIAGNOSIS — F0390 Unspecified dementia without behavioral disturbance: Secondary | ICD-10-CM | POA: Diagnosis not present

## 2018-03-22 DIAGNOSIS — I5032 Chronic diastolic (congestive) heart failure: Secondary | ICD-10-CM | POA: Diagnosis not present

## 2018-03-22 DIAGNOSIS — Z794 Long term (current) use of insulin: Secondary | ICD-10-CM | POA: Diagnosis not present

## 2018-03-22 DIAGNOSIS — I110 Hypertensive heart disease with heart failure: Secondary | ICD-10-CM | POA: Diagnosis not present

## 2018-03-23 DIAGNOSIS — E119 Type 2 diabetes mellitus without complications: Secondary | ICD-10-CM | POA: Diagnosis not present

## 2018-03-23 DIAGNOSIS — I110 Hypertensive heart disease with heart failure: Secondary | ICD-10-CM | POA: Diagnosis not present

## 2018-03-23 DIAGNOSIS — M810 Age-related osteoporosis without current pathological fracture: Secondary | ICD-10-CM | POA: Diagnosis not present

## 2018-03-23 DIAGNOSIS — C9110 Chronic lymphocytic leukemia of B-cell type not having achieved remission: Secondary | ICD-10-CM | POA: Diagnosis not present

## 2018-03-23 DIAGNOSIS — Z87891 Personal history of nicotine dependence: Secondary | ICD-10-CM | POA: Diagnosis not present

## 2018-03-23 DIAGNOSIS — I2510 Atherosclerotic heart disease of native coronary artery without angina pectoris: Secondary | ICD-10-CM | POA: Diagnosis not present

## 2018-03-23 DIAGNOSIS — Z955 Presence of coronary angioplasty implant and graft: Secondary | ICD-10-CM | POA: Diagnosis not present

## 2018-03-23 DIAGNOSIS — C61 Malignant neoplasm of prostate: Secondary | ICD-10-CM | POA: Diagnosis not present

## 2018-03-23 DIAGNOSIS — F0391 Unspecified dementia with behavioral disturbance: Secondary | ICD-10-CM | POA: Diagnosis not present

## 2018-03-23 DIAGNOSIS — Z794 Long term (current) use of insulin: Secondary | ICD-10-CM | POA: Diagnosis not present

## 2018-03-23 DIAGNOSIS — I5032 Chronic diastolic (congestive) heart failure: Secondary | ICD-10-CM | POA: Diagnosis not present

## 2018-03-25 DIAGNOSIS — I2510 Atherosclerotic heart disease of native coronary artery without angina pectoris: Secondary | ICD-10-CM | POA: Diagnosis not present

## 2018-03-25 DIAGNOSIS — Z794 Long term (current) use of insulin: Secondary | ICD-10-CM | POA: Diagnosis not present

## 2018-03-25 DIAGNOSIS — I110 Hypertensive heart disease with heart failure: Secondary | ICD-10-CM | POA: Diagnosis not present

## 2018-03-25 DIAGNOSIS — Z955 Presence of coronary angioplasty implant and graft: Secondary | ICD-10-CM | POA: Diagnosis not present

## 2018-03-25 DIAGNOSIS — E119 Type 2 diabetes mellitus without complications: Secondary | ICD-10-CM | POA: Diagnosis not present

## 2018-03-25 DIAGNOSIS — I5032 Chronic diastolic (congestive) heart failure: Secondary | ICD-10-CM | POA: Diagnosis not present

## 2018-04-01 DIAGNOSIS — I5032 Chronic diastolic (congestive) heart failure: Secondary | ICD-10-CM | POA: Diagnosis not present

## 2018-04-01 DIAGNOSIS — Z794 Long term (current) use of insulin: Secondary | ICD-10-CM | POA: Diagnosis not present

## 2018-04-01 DIAGNOSIS — I2510 Atherosclerotic heart disease of native coronary artery without angina pectoris: Secondary | ICD-10-CM | POA: Diagnosis not present

## 2018-04-01 DIAGNOSIS — E119 Type 2 diabetes mellitus without complications: Secondary | ICD-10-CM | POA: Diagnosis not present

## 2018-04-01 DIAGNOSIS — Z955 Presence of coronary angioplasty implant and graft: Secondary | ICD-10-CM | POA: Diagnosis not present

## 2018-04-01 DIAGNOSIS — I110 Hypertensive heart disease with heart failure: Secondary | ICD-10-CM | POA: Diagnosis not present

## 2018-04-04 DIAGNOSIS — E119 Type 2 diabetes mellitus without complications: Secondary | ICD-10-CM | POA: Diagnosis not present

## 2018-04-04 DIAGNOSIS — Z955 Presence of coronary angioplasty implant and graft: Secondary | ICD-10-CM | POA: Diagnosis not present

## 2018-04-04 DIAGNOSIS — I2510 Atherosclerotic heart disease of native coronary artery without angina pectoris: Secondary | ICD-10-CM | POA: Diagnosis not present

## 2018-04-04 DIAGNOSIS — I110 Hypertensive heart disease with heart failure: Secondary | ICD-10-CM | POA: Diagnosis not present

## 2018-04-04 DIAGNOSIS — Z794 Long term (current) use of insulin: Secondary | ICD-10-CM | POA: Diagnosis not present

## 2018-04-04 DIAGNOSIS — I5032 Chronic diastolic (congestive) heart failure: Secondary | ICD-10-CM | POA: Diagnosis not present

## 2018-04-08 DIAGNOSIS — E119 Type 2 diabetes mellitus without complications: Secondary | ICD-10-CM | POA: Diagnosis not present

## 2018-04-08 DIAGNOSIS — I5032 Chronic diastolic (congestive) heart failure: Secondary | ICD-10-CM | POA: Diagnosis not present

## 2018-04-08 DIAGNOSIS — Z955 Presence of coronary angioplasty implant and graft: Secondary | ICD-10-CM | POA: Diagnosis not present

## 2018-04-08 DIAGNOSIS — I2510 Atherosclerotic heart disease of native coronary artery without angina pectoris: Secondary | ICD-10-CM | POA: Diagnosis not present

## 2018-04-08 DIAGNOSIS — Z794 Long term (current) use of insulin: Secondary | ICD-10-CM | POA: Diagnosis not present

## 2018-04-08 DIAGNOSIS — I110 Hypertensive heart disease with heart failure: Secondary | ICD-10-CM | POA: Diagnosis not present

## 2018-04-15 DIAGNOSIS — Z955 Presence of coronary angioplasty implant and graft: Secondary | ICD-10-CM | POA: Diagnosis not present

## 2018-04-15 DIAGNOSIS — I2510 Atherosclerotic heart disease of native coronary artery without angina pectoris: Secondary | ICD-10-CM | POA: Diagnosis not present

## 2018-04-15 DIAGNOSIS — I110 Hypertensive heart disease with heart failure: Secondary | ICD-10-CM | POA: Diagnosis not present

## 2018-04-15 DIAGNOSIS — Z794 Long term (current) use of insulin: Secondary | ICD-10-CM | POA: Diagnosis not present

## 2018-04-15 DIAGNOSIS — I5032 Chronic diastolic (congestive) heart failure: Secondary | ICD-10-CM | POA: Diagnosis not present

## 2018-04-15 DIAGNOSIS — E119 Type 2 diabetes mellitus without complications: Secondary | ICD-10-CM | POA: Diagnosis not present

## 2018-04-18 DIAGNOSIS — Z794 Long term (current) use of insulin: Secondary | ICD-10-CM | POA: Diagnosis not present

## 2018-04-18 DIAGNOSIS — E119 Type 2 diabetes mellitus without complications: Secondary | ICD-10-CM | POA: Diagnosis not present

## 2018-04-18 DIAGNOSIS — Z955 Presence of coronary angioplasty implant and graft: Secondary | ICD-10-CM | POA: Diagnosis not present

## 2018-04-18 DIAGNOSIS — I110 Hypertensive heart disease with heart failure: Secondary | ICD-10-CM | POA: Diagnosis not present

## 2018-04-18 DIAGNOSIS — I5032 Chronic diastolic (congestive) heart failure: Secondary | ICD-10-CM | POA: Diagnosis not present

## 2018-04-18 DIAGNOSIS — I2510 Atherosclerotic heart disease of native coronary artery without angina pectoris: Secondary | ICD-10-CM | POA: Diagnosis not present

## 2018-04-22 DIAGNOSIS — I110 Hypertensive heart disease with heart failure: Secondary | ICD-10-CM | POA: Diagnosis not present

## 2018-04-22 DIAGNOSIS — I5032 Chronic diastolic (congestive) heart failure: Secondary | ICD-10-CM | POA: Diagnosis not present

## 2018-04-22 DIAGNOSIS — I2510 Atherosclerotic heart disease of native coronary artery without angina pectoris: Secondary | ICD-10-CM | POA: Diagnosis not present

## 2018-04-22 DIAGNOSIS — Z794 Long term (current) use of insulin: Secondary | ICD-10-CM | POA: Diagnosis not present

## 2018-04-22 DIAGNOSIS — Z955 Presence of coronary angioplasty implant and graft: Secondary | ICD-10-CM | POA: Diagnosis not present

## 2018-04-22 DIAGNOSIS — E119 Type 2 diabetes mellitus without complications: Secondary | ICD-10-CM | POA: Diagnosis not present

## 2018-04-27 DIAGNOSIS — F419 Anxiety disorder, unspecified: Secondary | ICD-10-CM | POA: Diagnosis not present

## 2018-04-28 DIAGNOSIS — E038 Other specified hypothyroidism: Secondary | ICD-10-CM | POA: Diagnosis not present

## 2018-05-01 DIAGNOSIS — M545 Low back pain: Secondary | ICD-10-CM | POA: Diagnosis not present

## 2018-05-01 DIAGNOSIS — M25551 Pain in right hip: Secondary | ICD-10-CM | POA: Diagnosis not present

## 2018-05-01 DIAGNOSIS — M25552 Pain in left hip: Secondary | ICD-10-CM | POA: Diagnosis not present

## 2018-05-10 DIAGNOSIS — R278 Other lack of coordination: Secondary | ICD-10-CM | POA: Diagnosis not present

## 2018-05-10 DIAGNOSIS — Z9181 History of falling: Secondary | ICD-10-CM | POA: Diagnosis not present

## 2018-05-10 DIAGNOSIS — M6281 Muscle weakness (generalized): Secondary | ICD-10-CM | POA: Diagnosis not present

## 2018-05-10 DIAGNOSIS — B351 Tinea unguium: Secondary | ICD-10-CM | POA: Diagnosis not present

## 2018-05-10 DIAGNOSIS — M79675 Pain in left toe(s): Secondary | ICD-10-CM | POA: Diagnosis not present

## 2018-05-11 DIAGNOSIS — Z9181 History of falling: Secondary | ICD-10-CM | POA: Diagnosis not present

## 2018-05-11 DIAGNOSIS — R278 Other lack of coordination: Secondary | ICD-10-CM | POA: Diagnosis not present

## 2018-05-11 DIAGNOSIS — M6281 Muscle weakness (generalized): Secondary | ICD-10-CM | POA: Diagnosis not present

## 2018-05-12 DIAGNOSIS — Z9181 History of falling: Secondary | ICD-10-CM | POA: Diagnosis not present

## 2018-05-12 DIAGNOSIS — R278 Other lack of coordination: Secondary | ICD-10-CM | POA: Diagnosis not present

## 2018-05-12 DIAGNOSIS — M6281 Muscle weakness (generalized): Secondary | ICD-10-CM | POA: Diagnosis not present

## 2018-05-13 DIAGNOSIS — M6281 Muscle weakness (generalized): Secondary | ICD-10-CM | POA: Diagnosis not present

## 2018-05-13 DIAGNOSIS — Z9181 History of falling: Secondary | ICD-10-CM | POA: Diagnosis not present

## 2018-05-13 DIAGNOSIS — R278 Other lack of coordination: Secondary | ICD-10-CM | POA: Diagnosis not present

## 2018-05-16 DIAGNOSIS — Z9181 History of falling: Secondary | ICD-10-CM | POA: Diagnosis not present

## 2018-05-16 DIAGNOSIS — F419 Anxiety disorder, unspecified: Secondary | ICD-10-CM | POA: Diagnosis not present

## 2018-05-16 DIAGNOSIS — M6281 Muscle weakness (generalized): Secondary | ICD-10-CM | POA: Diagnosis not present

## 2018-05-16 DIAGNOSIS — I5033 Acute on chronic diastolic (congestive) heart failure: Secondary | ICD-10-CM | POA: Diagnosis not present

## 2018-05-16 DIAGNOSIS — E119 Type 2 diabetes mellitus without complications: Secondary | ICD-10-CM | POA: Diagnosis not present

## 2018-05-16 DIAGNOSIS — R278 Other lack of coordination: Secondary | ICD-10-CM | POA: Diagnosis not present

## 2018-05-17 DIAGNOSIS — M6281 Muscle weakness (generalized): Secondary | ICD-10-CM | POA: Diagnosis not present

## 2018-05-17 DIAGNOSIS — R278 Other lack of coordination: Secondary | ICD-10-CM | POA: Diagnosis not present

## 2018-05-17 DIAGNOSIS — Z9181 History of falling: Secondary | ICD-10-CM | POA: Diagnosis not present

## 2018-05-18 DIAGNOSIS — R278 Other lack of coordination: Secondary | ICD-10-CM | POA: Diagnosis not present

## 2018-05-18 DIAGNOSIS — M6281 Muscle weakness (generalized): Secondary | ICD-10-CM | POA: Diagnosis not present

## 2018-05-18 DIAGNOSIS — Z9181 History of falling: Secondary | ICD-10-CM | POA: Diagnosis not present

## 2018-05-19 DIAGNOSIS — M6281 Muscle weakness (generalized): Secondary | ICD-10-CM | POA: Diagnosis not present

## 2018-05-19 DIAGNOSIS — Z9181 History of falling: Secondary | ICD-10-CM | POA: Diagnosis not present

## 2018-05-19 DIAGNOSIS — R278 Other lack of coordination: Secondary | ICD-10-CM | POA: Diagnosis not present

## 2018-05-20 DIAGNOSIS — R278 Other lack of coordination: Secondary | ICD-10-CM | POA: Diagnosis not present

## 2018-05-20 DIAGNOSIS — M6281 Muscle weakness (generalized): Secondary | ICD-10-CM | POA: Diagnosis not present

## 2018-05-20 DIAGNOSIS — Z9181 History of falling: Secondary | ICD-10-CM | POA: Diagnosis not present

## 2018-05-23 DIAGNOSIS — S32502A Unspecified fracture of left pubis, initial encounter for closed fracture: Secondary | ICD-10-CM | POA: Diagnosis not present

## 2018-05-23 DIAGNOSIS — R52 Pain, unspecified: Secondary | ICD-10-CM | POA: Diagnosis not present

## 2018-05-23 DIAGNOSIS — F419 Anxiety disorder, unspecified: Secondary | ICD-10-CM | POA: Diagnosis not present

## 2018-05-23 DIAGNOSIS — F062 Psychotic disorder with delusions due to known physiological condition: Secondary | ICD-10-CM | POA: Diagnosis not present

## 2018-05-27 DIAGNOSIS — R443 Hallucinations, unspecified: Secondary | ICD-10-CM | POA: Diagnosis not present

## 2018-05-27 DIAGNOSIS — F062 Psychotic disorder with delusions due to known physiological condition: Secondary | ICD-10-CM | POA: Diagnosis not present

## 2018-06-01 DIAGNOSIS — R443 Hallucinations, unspecified: Secondary | ICD-10-CM | POA: Diagnosis not present

## 2018-06-01 DIAGNOSIS — F062 Psychotic disorder with delusions due to known physiological condition: Secondary | ICD-10-CM | POA: Diagnosis not present

## 2018-06-03 DIAGNOSIS — F062 Psychotic disorder with delusions due to known physiological condition: Secondary | ICD-10-CM | POA: Diagnosis not present

## 2018-06-03 DIAGNOSIS — R443 Hallucinations, unspecified: Secondary | ICD-10-CM | POA: Diagnosis not present

## 2018-06-06 DIAGNOSIS — R443 Hallucinations, unspecified: Secondary | ICD-10-CM | POA: Diagnosis not present

## 2018-06-06 DIAGNOSIS — F419 Anxiety disorder, unspecified: Secondary | ICD-10-CM | POA: Diagnosis not present

## 2018-06-28 DIAGNOSIS — E118 Type 2 diabetes mellitus with unspecified complications: Secondary | ICD-10-CM | POA: Diagnosis not present

## 2018-06-28 DIAGNOSIS — I2510 Atherosclerotic heart disease of native coronary artery without angina pectoris: Secondary | ICD-10-CM | POA: Diagnosis not present

## 2018-06-28 DIAGNOSIS — F0391 Unspecified dementia with behavioral disturbance: Secondary | ICD-10-CM | POA: Diagnosis not present

## 2018-06-28 DIAGNOSIS — I679 Cerebrovascular disease, unspecified: Secondary | ICD-10-CM | POA: Diagnosis not present

## 2018-07-19 DIAGNOSIS — B351 Tinea unguium: Secondary | ICD-10-CM | POA: Diagnosis not present

## 2018-07-19 DIAGNOSIS — M79675 Pain in left toe(s): Secondary | ICD-10-CM | POA: Diagnosis not present

## 2018-08-25 DIAGNOSIS — I369 Nonrheumatic tricuspid valve disorder, unspecified: Secondary | ICD-10-CM | POA: Diagnosis not present

## 2018-08-25 DIAGNOSIS — E039 Hypothyroidism, unspecified: Secondary | ICD-10-CM | POA: Diagnosis not present

## 2018-08-25 DIAGNOSIS — E559 Vitamin D deficiency, unspecified: Secondary | ICD-10-CM | POA: Diagnosis not present

## 2018-08-25 DIAGNOSIS — M810 Age-related osteoporosis without current pathological fracture: Secondary | ICD-10-CM | POA: Diagnosis not present

## 2018-08-25 DIAGNOSIS — I10 Essential (primary) hypertension: Secondary | ICD-10-CM | POA: Diagnosis not present

## 2018-08-25 DIAGNOSIS — F329 Major depressive disorder, single episode, unspecified: Secondary | ICD-10-CM | POA: Diagnosis not present

## 2018-08-25 DIAGNOSIS — D649 Anemia, unspecified: Secondary | ICD-10-CM | POA: Diagnosis not present

## 2018-08-25 DIAGNOSIS — I509 Heart failure, unspecified: Secondary | ICD-10-CM | POA: Diagnosis not present

## 2018-08-25 DIAGNOSIS — D519 Vitamin B12 deficiency anemia, unspecified: Secondary | ICD-10-CM | POA: Diagnosis not present

## 2018-08-25 DIAGNOSIS — I4891 Unspecified atrial fibrillation: Secondary | ICD-10-CM | POA: Diagnosis not present

## 2018-08-25 DIAGNOSIS — E119 Type 2 diabetes mellitus without complications: Secondary | ICD-10-CM | POA: Diagnosis not present

## 2018-08-26 DIAGNOSIS — D485 Neoplasm of uncertain behavior of skin: Secondary | ICD-10-CM | POA: Diagnosis not present

## 2018-08-26 DIAGNOSIS — L821 Other seborrheic keratosis: Secondary | ICD-10-CM | POA: Diagnosis not present

## 2018-08-26 DIAGNOSIS — D044 Carcinoma in situ of skin of scalp and neck: Secondary | ICD-10-CM | POA: Diagnosis not present

## 2018-08-26 DIAGNOSIS — C4442 Squamous cell carcinoma of skin of scalp and neck: Secondary | ICD-10-CM | POA: Diagnosis not present

## 2018-09-16 DIAGNOSIS — I10 Essential (primary) hypertension: Secondary | ICD-10-CM | POA: Diagnosis not present

## 2018-09-16 DIAGNOSIS — D649 Anemia, unspecified: Secondary | ICD-10-CM | POA: Diagnosis not present

## 2018-09-16 DIAGNOSIS — E789 Disorder of lipoprotein metabolism, unspecified: Secondary | ICD-10-CM | POA: Diagnosis not present

## 2018-09-16 DIAGNOSIS — I2510 Atherosclerotic heart disease of native coronary artery without angina pectoris: Secondary | ICD-10-CM | POA: Diagnosis not present

## 2018-09-20 DIAGNOSIS — M79675 Pain in left toe(s): Secondary | ICD-10-CM | POA: Diagnosis not present

## 2018-09-20 DIAGNOSIS — B351 Tinea unguium: Secondary | ICD-10-CM | POA: Diagnosis not present

## 2018-10-28 DIAGNOSIS — Z85828 Personal history of other malignant neoplasm of skin: Secondary | ICD-10-CM | POA: Diagnosis not present

## 2018-10-28 DIAGNOSIS — L821 Other seborrheic keratosis: Secondary | ICD-10-CM | POA: Diagnosis not present

## 2018-10-28 DIAGNOSIS — L218 Other seborrheic dermatitis: Secondary | ICD-10-CM | POA: Diagnosis not present

## 2018-10-28 DIAGNOSIS — C4442 Squamous cell carcinoma of skin of scalp and neck: Secondary | ICD-10-CM | POA: Diagnosis not present

## 2018-10-28 DIAGNOSIS — D044 Carcinoma in situ of skin of scalp and neck: Secondary | ICD-10-CM | POA: Diagnosis not present

## 2018-10-28 DIAGNOSIS — L570 Actinic keratosis: Secondary | ICD-10-CM | POA: Diagnosis not present

## 2018-12-09 DIAGNOSIS — F062 Psychotic disorder with delusions due to known physiological condition: Secondary | ICD-10-CM | POA: Diagnosis not present

## 2018-12-20 DIAGNOSIS — I679 Cerebrovascular disease, unspecified: Secondary | ICD-10-CM | POA: Diagnosis not present

## 2018-12-20 DIAGNOSIS — F0391 Unspecified dementia with behavioral disturbance: Secondary | ICD-10-CM | POA: Diagnosis not present

## 2018-12-20 DIAGNOSIS — I2510 Atherosclerotic heart disease of native coronary artery without angina pectoris: Secondary | ICD-10-CM | POA: Diagnosis not present

## 2018-12-20 DIAGNOSIS — E118 Type 2 diabetes mellitus with unspecified complications: Secondary | ICD-10-CM | POA: Diagnosis not present

## 2019-01-17 DIAGNOSIS — M79675 Pain in left toe(s): Secondary | ICD-10-CM | POA: Diagnosis not present

## 2019-01-17 DIAGNOSIS — B351 Tinea unguium: Secondary | ICD-10-CM | POA: Diagnosis not present

## 2019-01-24 DIAGNOSIS — D044 Carcinoma in situ of skin of scalp and neck: Secondary | ICD-10-CM | POA: Diagnosis not present

## 2019-01-24 DIAGNOSIS — R52 Pain, unspecified: Secondary | ICD-10-CM | POA: Diagnosis not present

## 2019-01-24 DIAGNOSIS — R5381 Other malaise: Secondary | ICD-10-CM | POA: Diagnosis not present

## 2019-01-24 DIAGNOSIS — F0151 Vascular dementia with behavioral disturbance: Secondary | ICD-10-CM | POA: Diagnosis not present

## 2019-01-24 DIAGNOSIS — R5383 Other fatigue: Secondary | ICD-10-CM | POA: Diagnosis not present

## 2019-01-24 DIAGNOSIS — R443 Hallucinations, unspecified: Secondary | ICD-10-CM | POA: Diagnosis not present

## 2019-01-26 DIAGNOSIS — R627 Adult failure to thrive: Secondary | ICD-10-CM | POA: Diagnosis not present

## 2019-01-26 DIAGNOSIS — R442 Other hallucinations: Secondary | ICD-10-CM | POA: Diagnosis not present

## 2019-01-26 DIAGNOSIS — R54 Age-related physical debility: Secondary | ICD-10-CM | POA: Diagnosis not present

## 2019-01-26 DIAGNOSIS — F0391 Unspecified dementia with behavioral disturbance: Secondary | ICD-10-CM | POA: Diagnosis not present

## 2019-02-03 DIAGNOSIS — R0602 Shortness of breath: Secondary | ICD-10-CM | POA: Diagnosis not present

## 2019-02-03 DIAGNOSIS — K117 Disturbances of salivary secretion: Secondary | ICD-10-CM | POA: Diagnosis not present

## 2019-02-03 DIAGNOSIS — Z515 Encounter for palliative care: Secondary | ICD-10-CM | POA: Diagnosis not present

## 2019-02-03 DIAGNOSIS — R0989 Other specified symptoms and signs involving the circulatory and respiratory systems: Secondary | ICD-10-CM | POA: Diagnosis not present

## 2019-02-22 DEATH — deceased
